# Patient Record
Sex: Female | Born: 1960
Health system: Southern US, Community
[De-identification: ages and names within clinical notes are randomized; demographics above are authoritative.]

## PROBLEM LIST (undated history)

## (undated) DIAGNOSIS — L900 Lichen sclerosus et atrophicus: Secondary | ICD-10-CM

## (undated) DIAGNOSIS — F329 Major depressive disorder, single episode, unspecified: Secondary | ICD-10-CM

## (undated) DIAGNOSIS — Z79890 Hormone replacement therapy (postmenopausal): Secondary | ICD-10-CM

## (undated) DIAGNOSIS — N951 Menopausal and female climacteric states: Secondary | ICD-10-CM

## (undated) DIAGNOSIS — F439 Reaction to severe stress, unspecified: Secondary | ICD-10-CM

## (undated) DIAGNOSIS — F419 Anxiety disorder, unspecified: Secondary | ICD-10-CM

## (undated) DIAGNOSIS — T7840XA Allergy, unspecified, initial encounter: Secondary | ICD-10-CM

## (undated) DIAGNOSIS — H9190 Unspecified hearing loss, unspecified ear: Secondary | ICD-10-CM

## (undated) DIAGNOSIS — N952 Postmenopausal atrophic vaginitis: Secondary | ICD-10-CM

## (undated) DIAGNOSIS — J302 Other seasonal allergic rhinitis: Secondary | ICD-10-CM

## (undated) HISTORY — DX: Reaction to severe stress, unspecified: F43.9

## (undated) HISTORY — DX: Menopausal and female climacteric states: N95.1

## (undated) HISTORY — DX: Lichen sclerosus et atrophicus: L90.0

## (undated) HISTORY — DX: Anxiety disorder, unspecified: F41.9

## (undated) HISTORY — DX: Allergy, unspecified, initial encounter: T78.40XA

## (undated) HISTORY — DX: Postmenopausal atrophic vaginitis: N95.2

## (undated) HISTORY — DX: Other seasonal allergic rhinitis: J30.2

## (undated) HISTORY — DX: Major depressive disorder, single episode, unspecified: F32.9

## (undated) HISTORY — DX: Hormone replacement therapy: Z79.890

## (undated) HISTORY — DX: Unspecified hearing loss, unspecified ear: H91.90

---

## 2001-02-25 ENCOUNTER — Other Ambulatory Visit: Admission: RE | Admit: 2001-02-25 | Discharge: 2001-02-25 | Payer: Self-pay | Admitting: Obstetrics and Gynecology

## 2001-07-05 ENCOUNTER — Emergency Department (HOSPITAL_COMMUNITY): Admission: EM | Admit: 2001-07-05 | Discharge: 2001-07-05 | Payer: Self-pay | Admitting: *Deleted

## 2002-07-24 ENCOUNTER — Ambulatory Visit (HOSPITAL_COMMUNITY): Admission: RE | Admit: 2002-07-24 | Discharge: 2002-07-24 | Payer: Self-pay | Admitting: Pulmonary Disease

## 2003-02-23 ENCOUNTER — Ambulatory Visit (HOSPITAL_COMMUNITY): Admission: RE | Admit: 2003-02-23 | Discharge: 2003-02-23 | Payer: Self-pay | Admitting: Obstetrics and Gynecology

## 2003-02-23 ENCOUNTER — Encounter: Payer: Self-pay | Admitting: Obstetrics and Gynecology

## 2004-03-26 ENCOUNTER — Ambulatory Visit (HOSPITAL_COMMUNITY): Admission: RE | Admit: 2004-03-26 | Discharge: 2004-03-26 | Payer: Self-pay | Admitting: Obstetrics and Gynecology

## 2004-07-22 ENCOUNTER — Ambulatory Visit (HOSPITAL_COMMUNITY): Admission: RE | Admit: 2004-07-22 | Discharge: 2004-07-22 | Payer: Self-pay | Admitting: General Surgery

## 2004-07-22 IMAGING — CR DG ANKLE COMPLETE 3+V*L*
2 series · 2 of 2 positions shown · non-contrast
Comparison: None.

CLINICAL DATA: Injured left ankle.

LEFT ANKLE - 3 VIEW  [DATE]:

[view not recorded (1 of 2)]
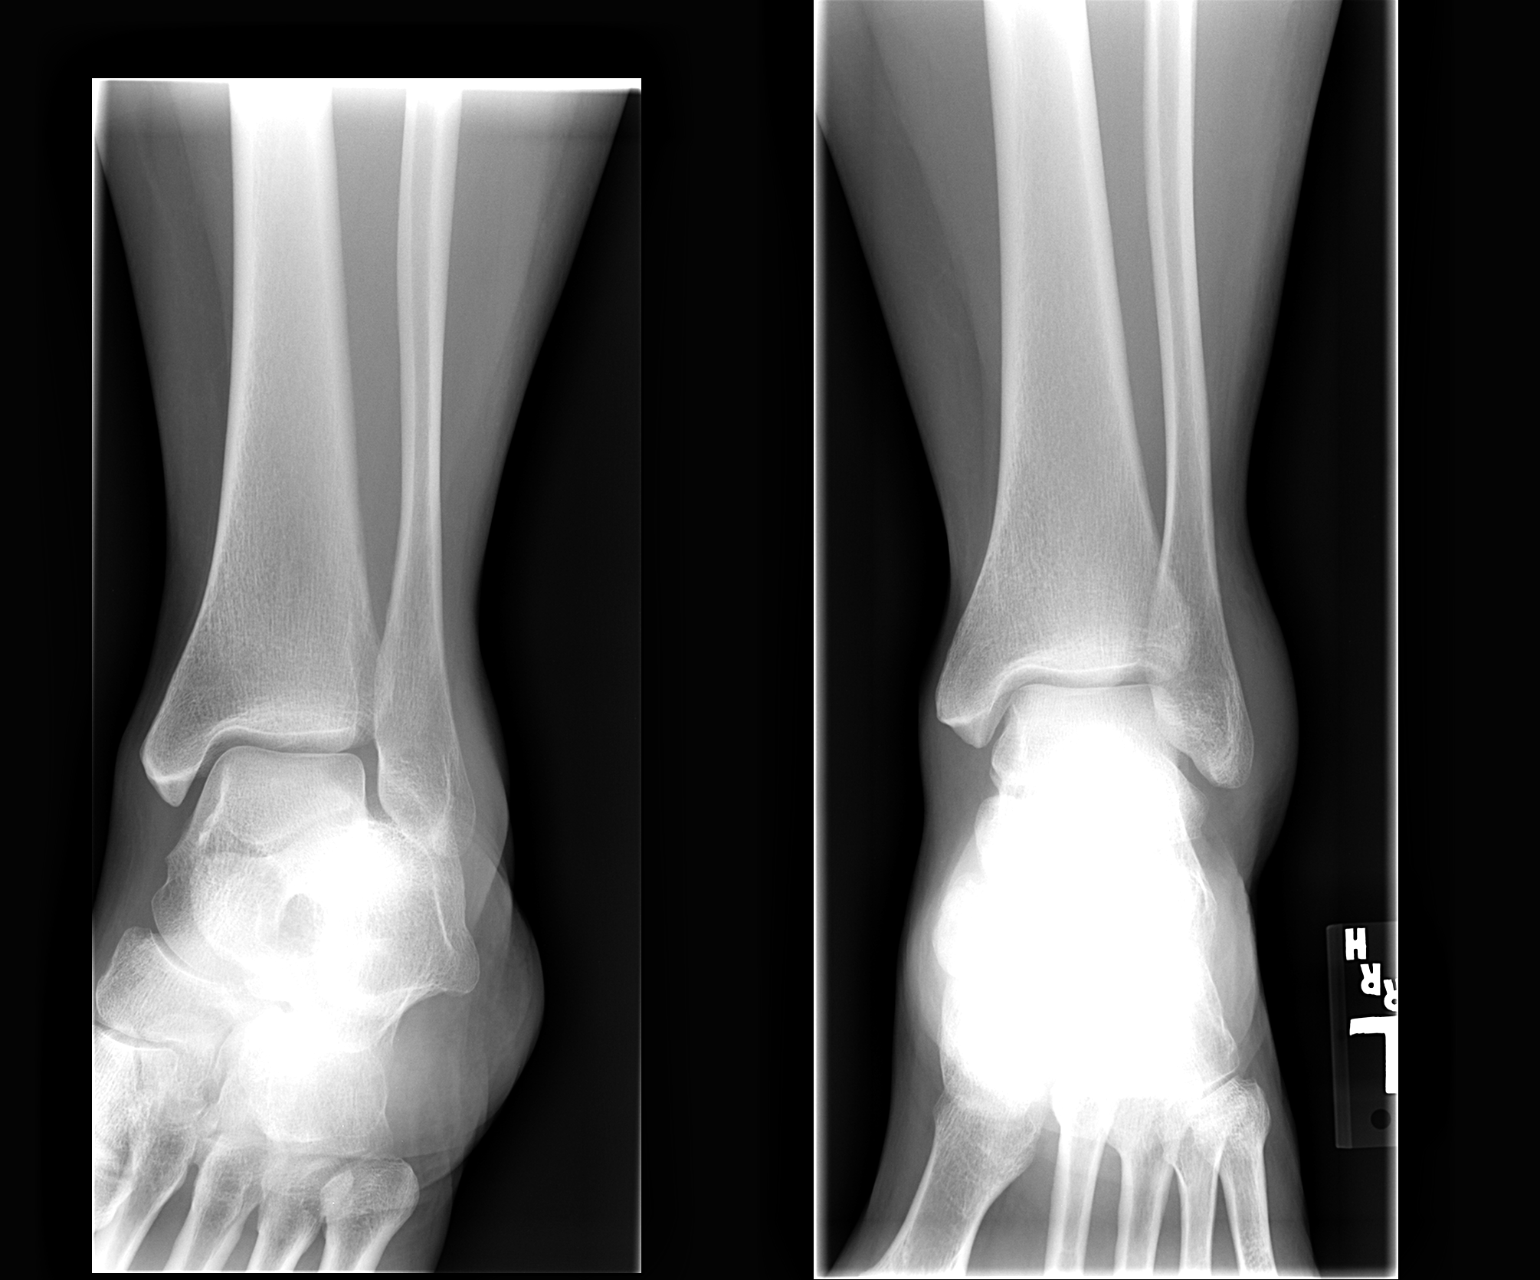

[view not recorded (2 of 2)]
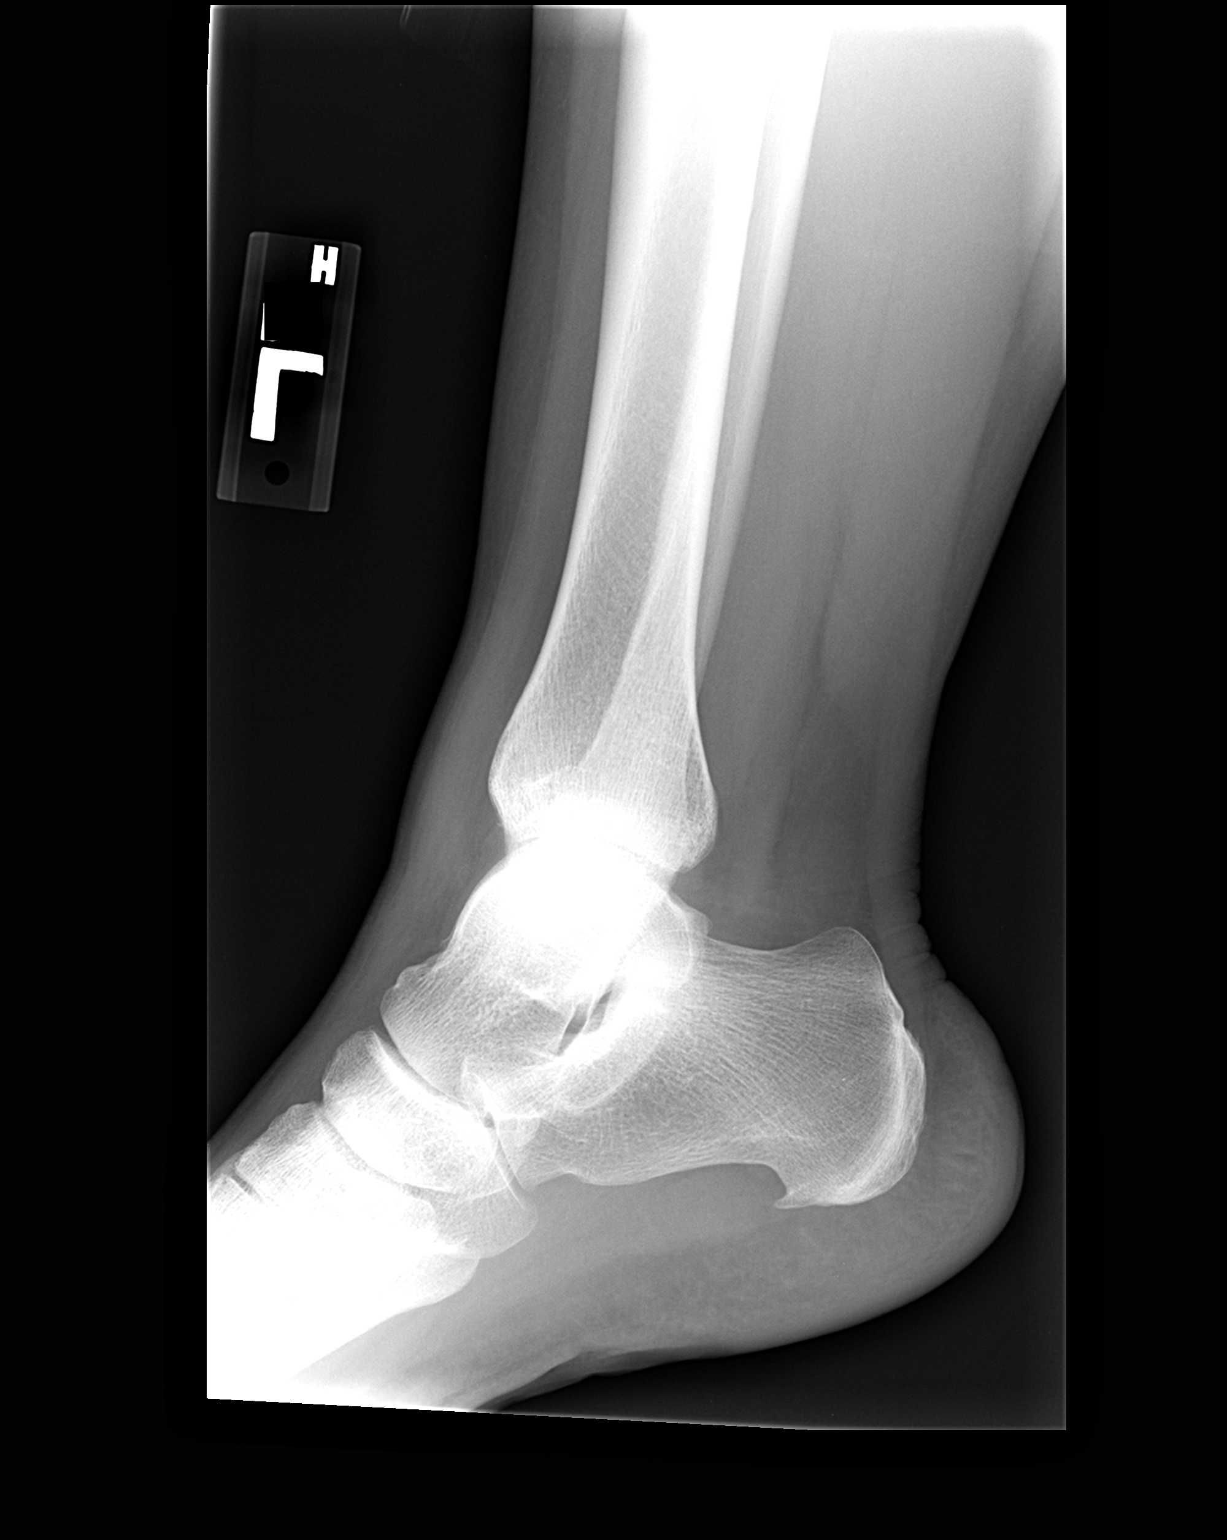

[2 of 2 positions shown; findings below may reference images not displayed]

FINDINGS: Marked lateral soft tissue swelling is present and there is an ankle
joint effusion. There is also anterior soft tissue swelling. There is no
evidence of an acute fracture or dislocation. The ankle mortise is intact. Note
is made of a small plantar spur arising from the calcaneus.
IMPRESSION: No skeletal abnormalities.

## 2004-11-07 ENCOUNTER — Ambulatory Visit (HOSPITAL_COMMUNITY): Admission: RE | Admit: 2004-11-07 | Discharge: 2004-11-07 | Payer: Self-pay | Admitting: Pulmonary Disease

## 2004-11-07 IMAGING — CR DG SHOULDER 2+V*R*
3 series · 3 of 3 positions shown · non-contrast
Comparison: none

CLINICAL DATA: Numbness and tingling in the right arm. 
 RIGHT SHOULDER - 3 VIEW: 
 No prior studies.

[view not recorded (1 of 3)]
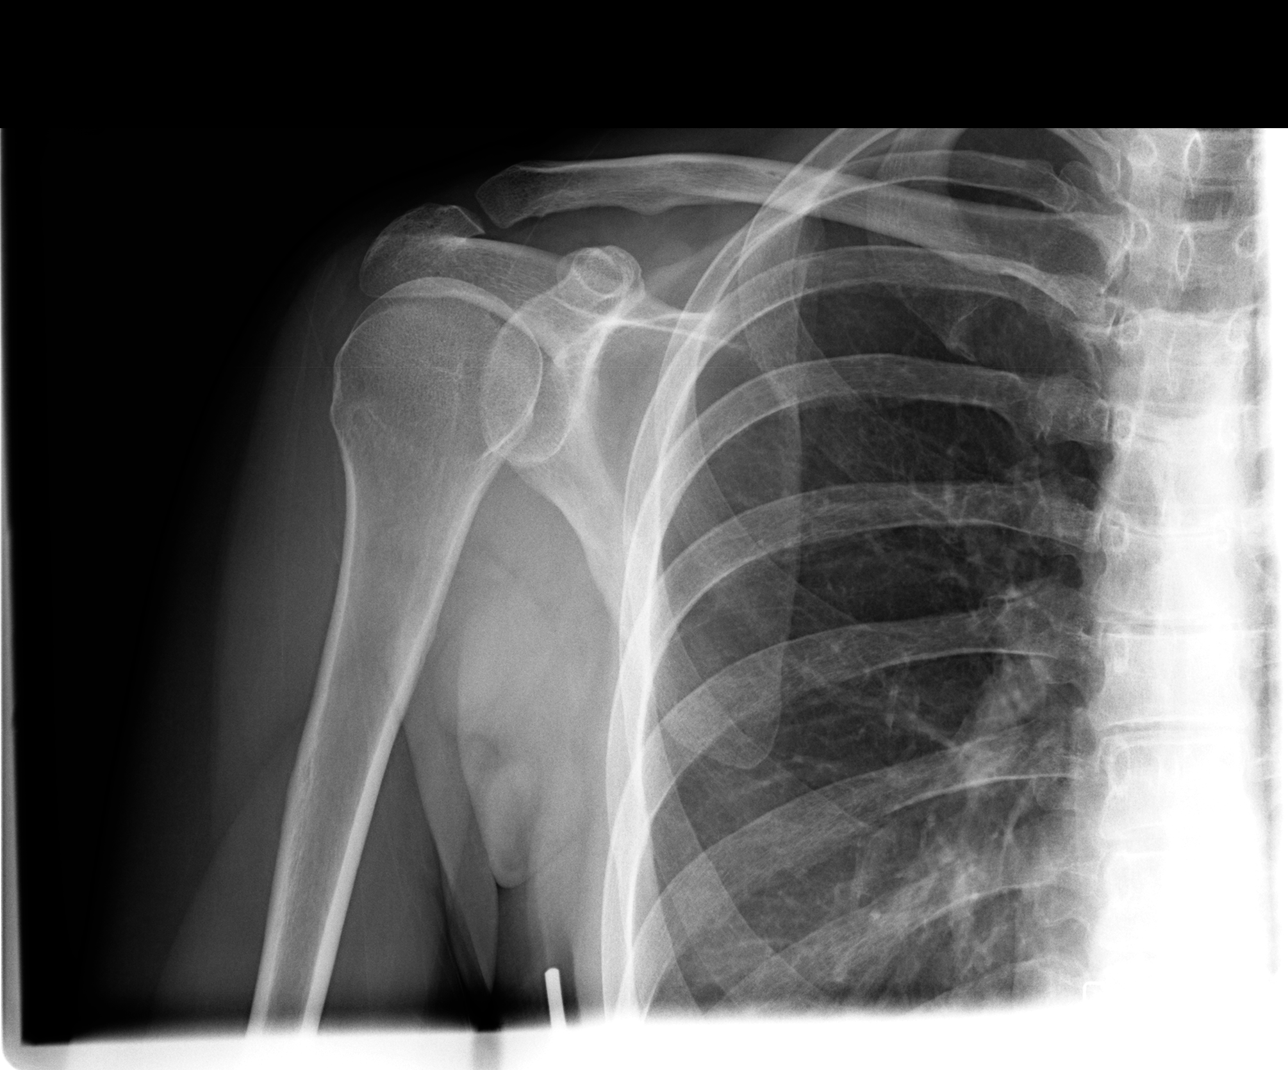

[view not recorded (2 of 3)]
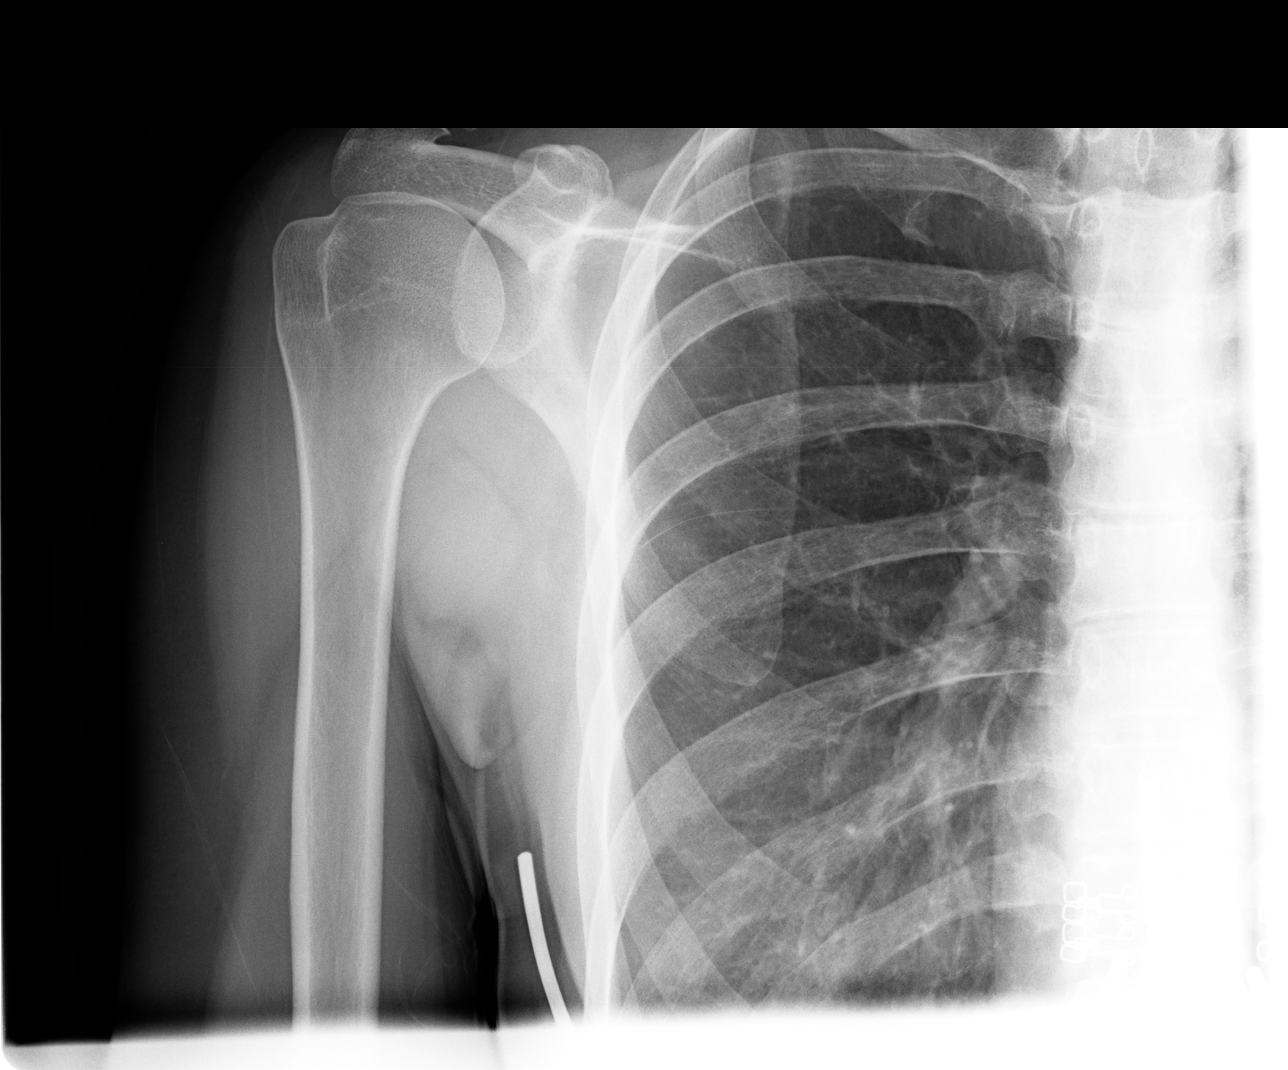

[view not recorded (3 of 3)]
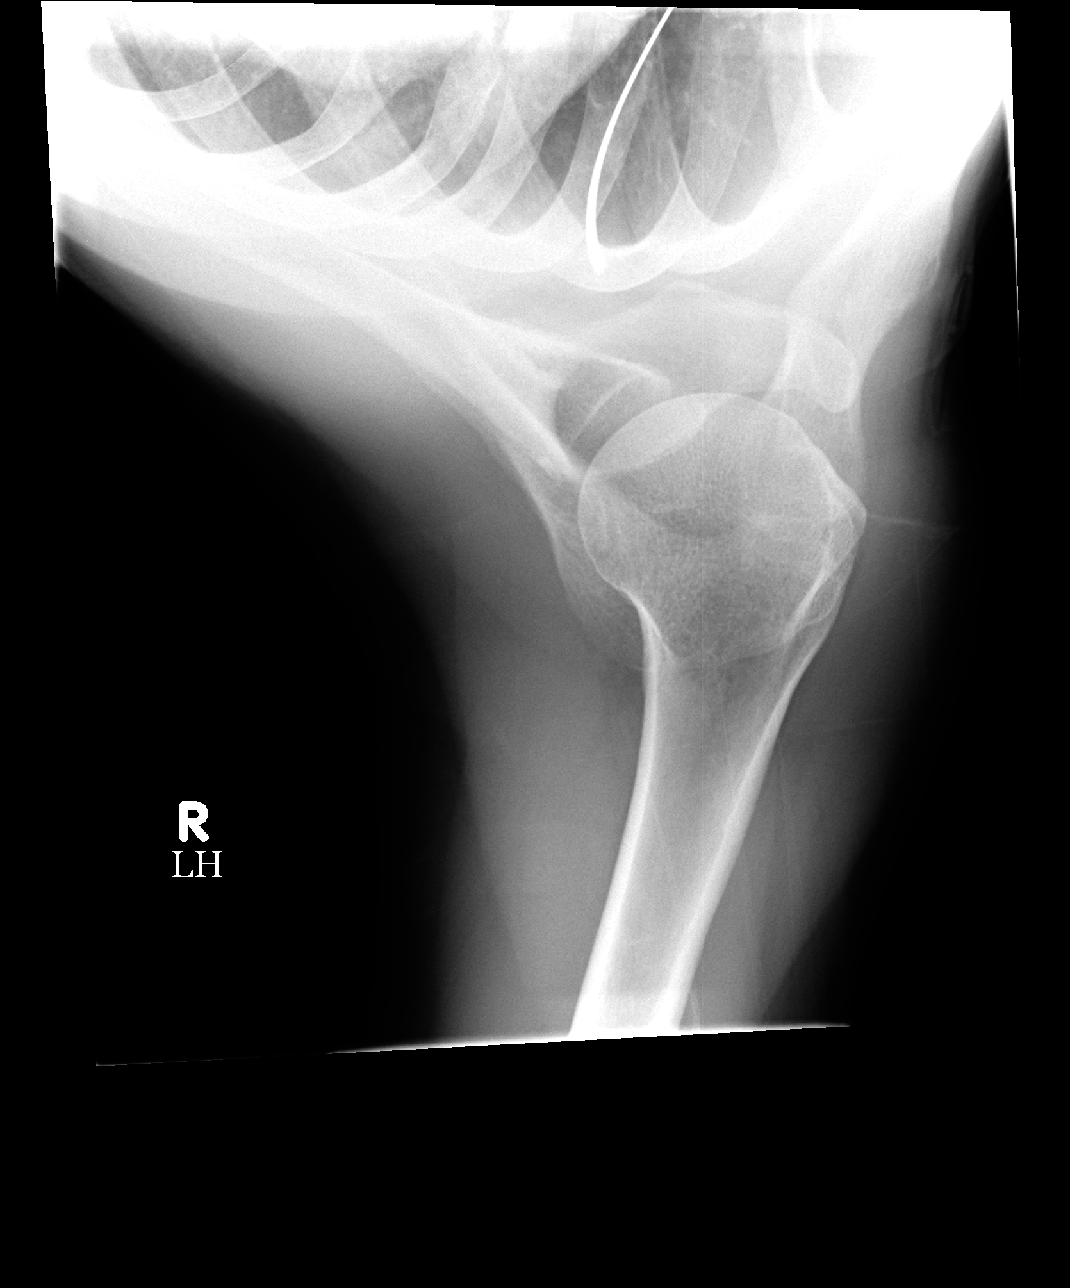

[3 of 3 positions shown; findings below may reference images not displayed]

FINDINGS: There is no evidence of fracture or dislocation.  There is no evidence of arthropathy or other focal bone abnormality.  Soft tissues are unremarkable.
IMPRESSION: Negative.
 CERVICAL SPINE ? 5 VIEW:
FINDINGS: There is some minimal posterior spurring at C3-4.  No signs of osseous foraminal narrowing.  No malalignment.  No acute findings.
IMPRESSION: No acute findings.   Very minimal posterior spurring at C3-4.

## 2004-11-07 IMAGING — CR DG CERVICAL SPINE COMPLETE 4+V
5 series · 5 of 5 positions shown · non-contrast
Comparison: none

CLINICAL DATA: Numbness and tingling in the right arm. 
 RIGHT SHOULDER - 3 VIEW: 
 No prior studies.

[view not recorded (1 of 5)]
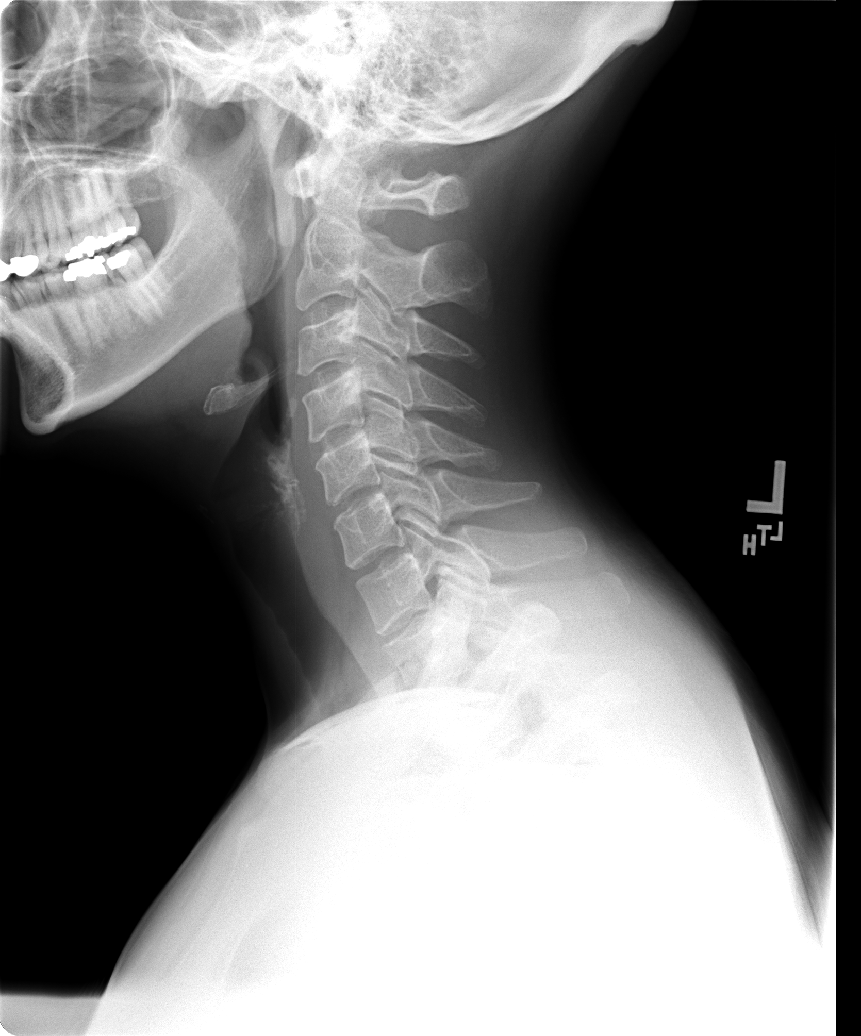

[view not recorded (2 of 5)]
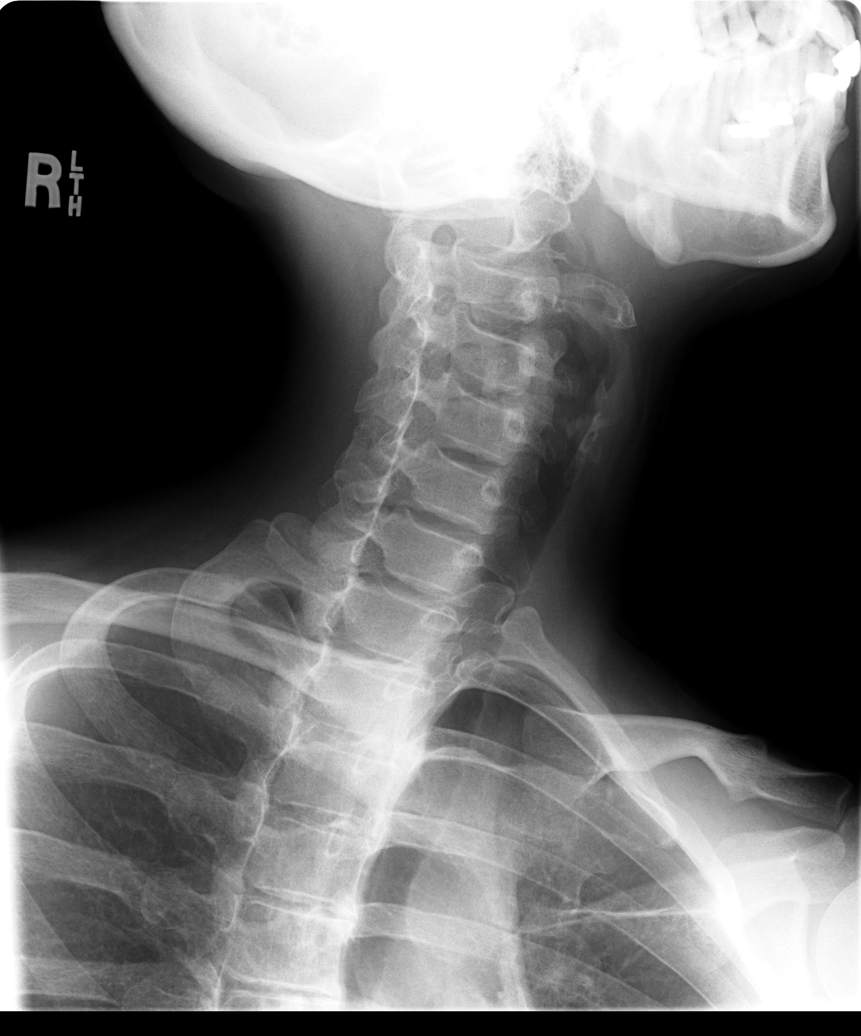

[view not recorded (3 of 5)]
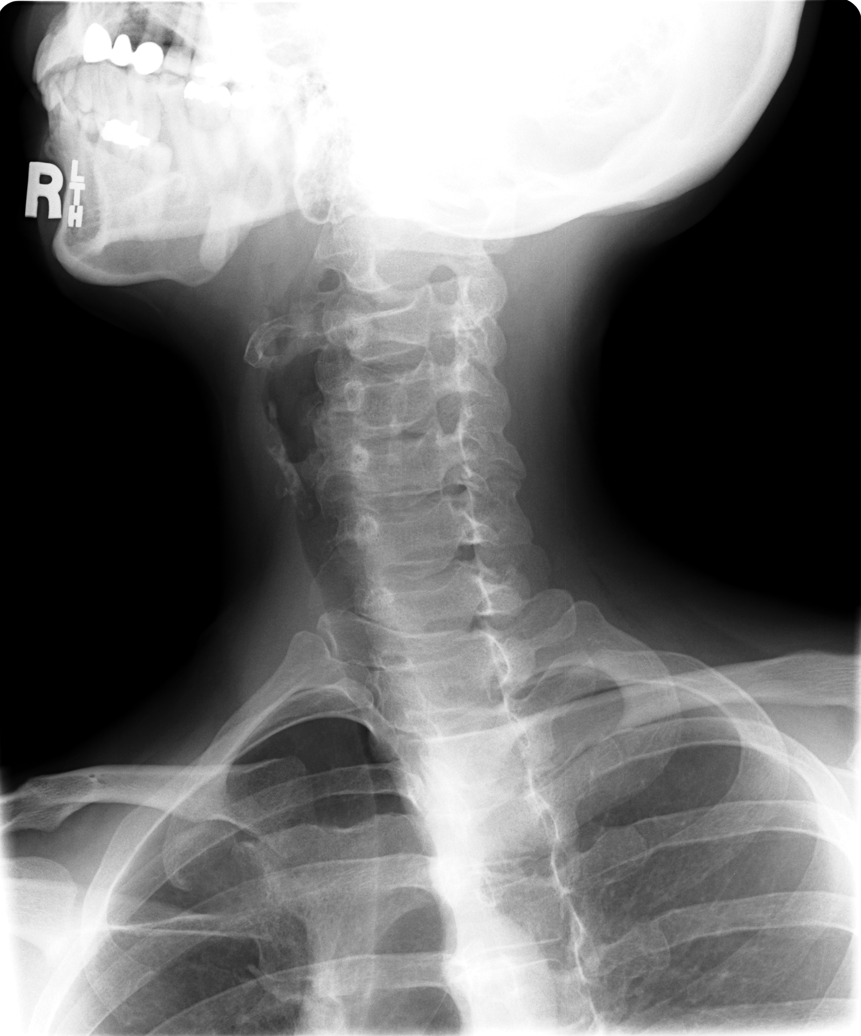

[view not recorded (4 of 5)]
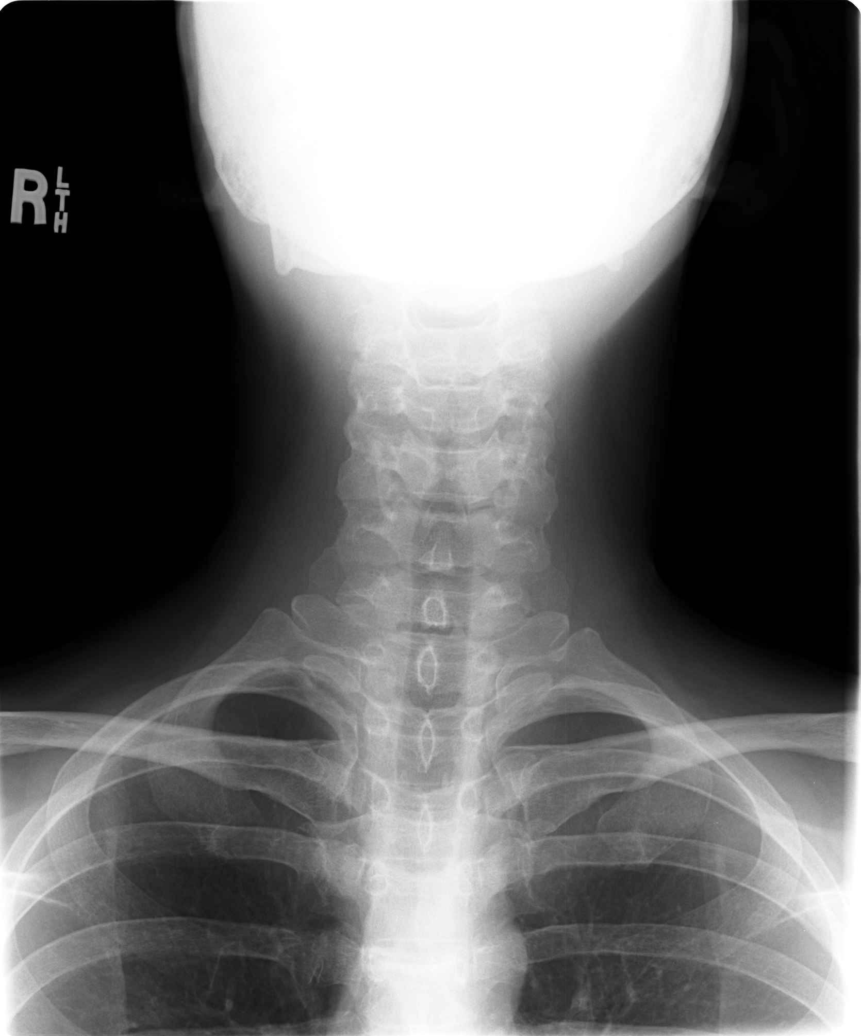

[view not recorded (5 of 5)]
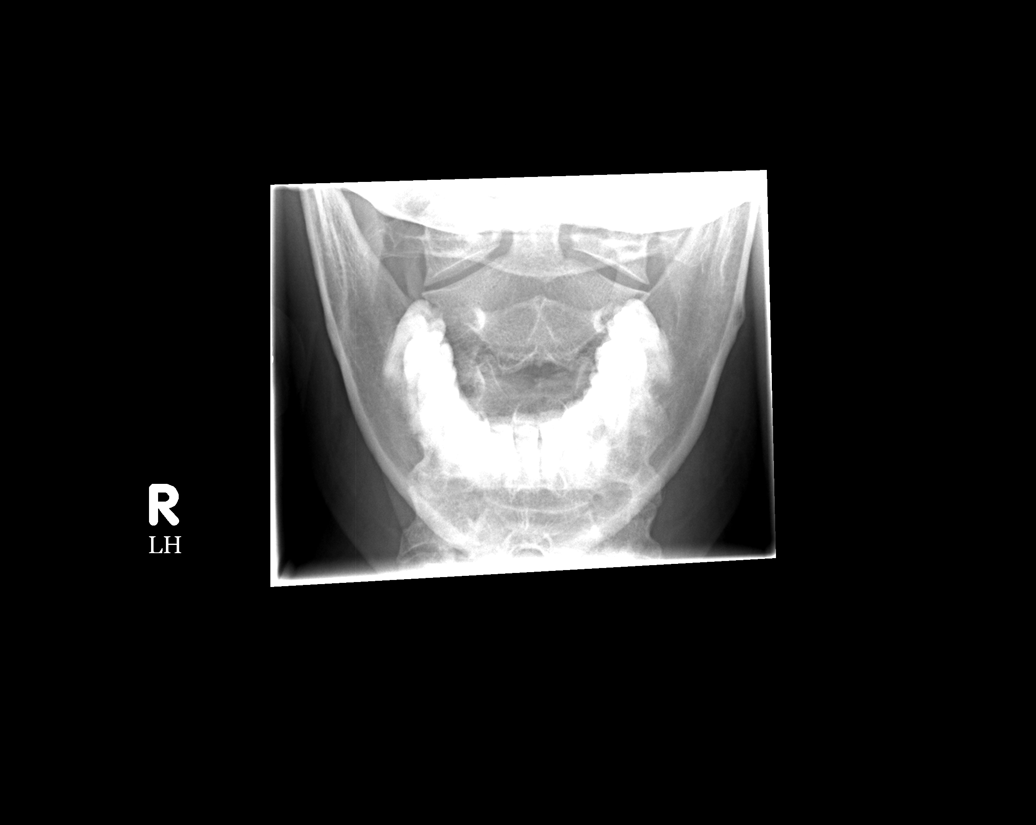

[5 of 5 positions shown; findings below may reference images not displayed]

FINDINGS: There is no evidence of fracture or dislocation.  There is no evidence of arthropathy or other focal bone abnormality.  Soft tissues are unremarkable.
IMPRESSION: Negative.
 CERVICAL SPINE ? 5 VIEW:
FINDINGS: There is some minimal posterior spurring at C3-4.  No signs of osseous foraminal narrowing.  No malalignment.  No acute findings.
IMPRESSION: No acute findings.   Very minimal posterior spurring at C3-4.

## 2005-04-03 ENCOUNTER — Ambulatory Visit (HOSPITAL_COMMUNITY): Admission: RE | Admit: 2005-04-03 | Discharge: 2005-04-03 | Payer: Self-pay | Admitting: Obstetrics and Gynecology

## 2005-04-03 IMAGING — CR DG LUMBAR SPINE COMPLETE 4+V
5 series · 5 of 5 positions shown · non-contrast
Comparison: none

CLINICAL DATA: Low back pain for years.
LUMBAR SPINE - 5 VIEW:

[view not recorded (1 of 5)]
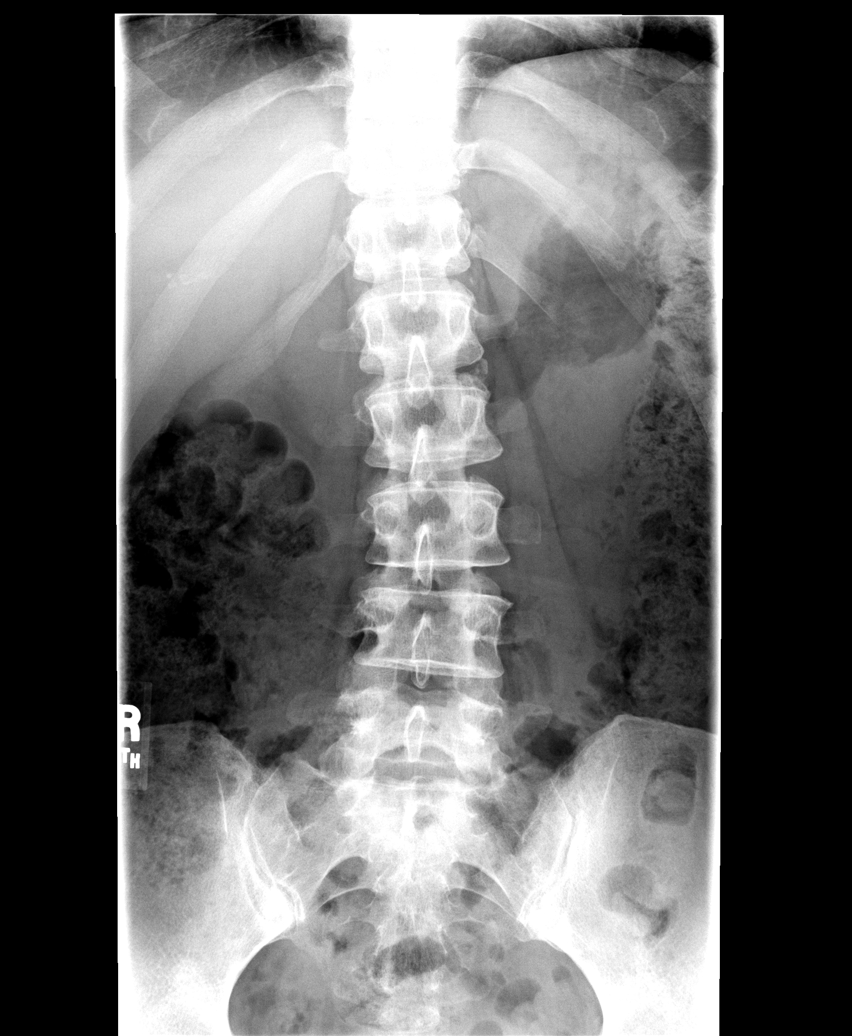

[view not recorded (2 of 5)]
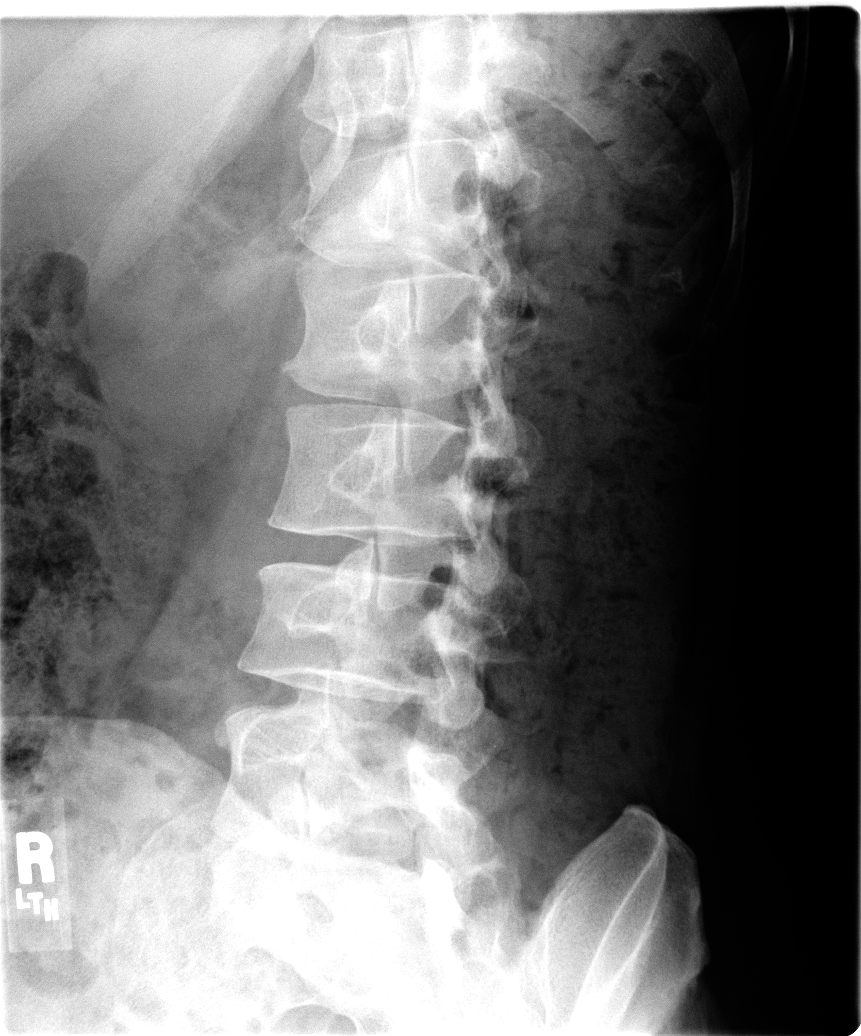

[view not recorded (3 of 5)]
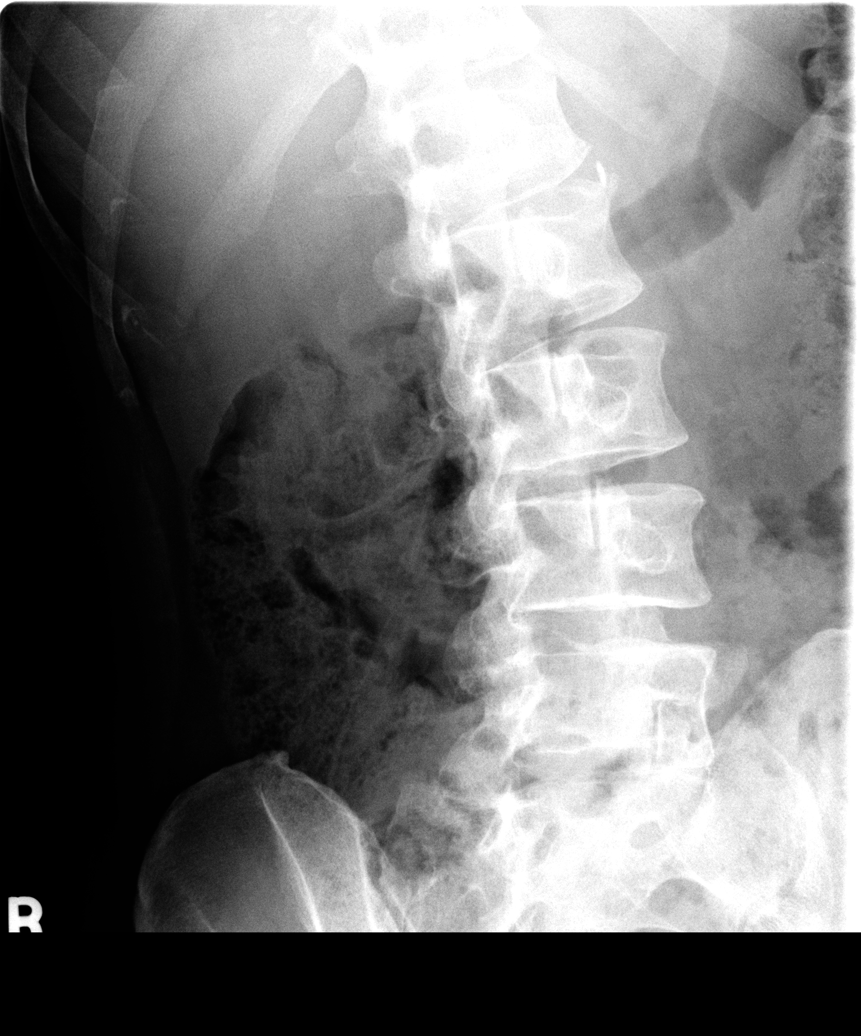

[view not recorded (4 of 5)]
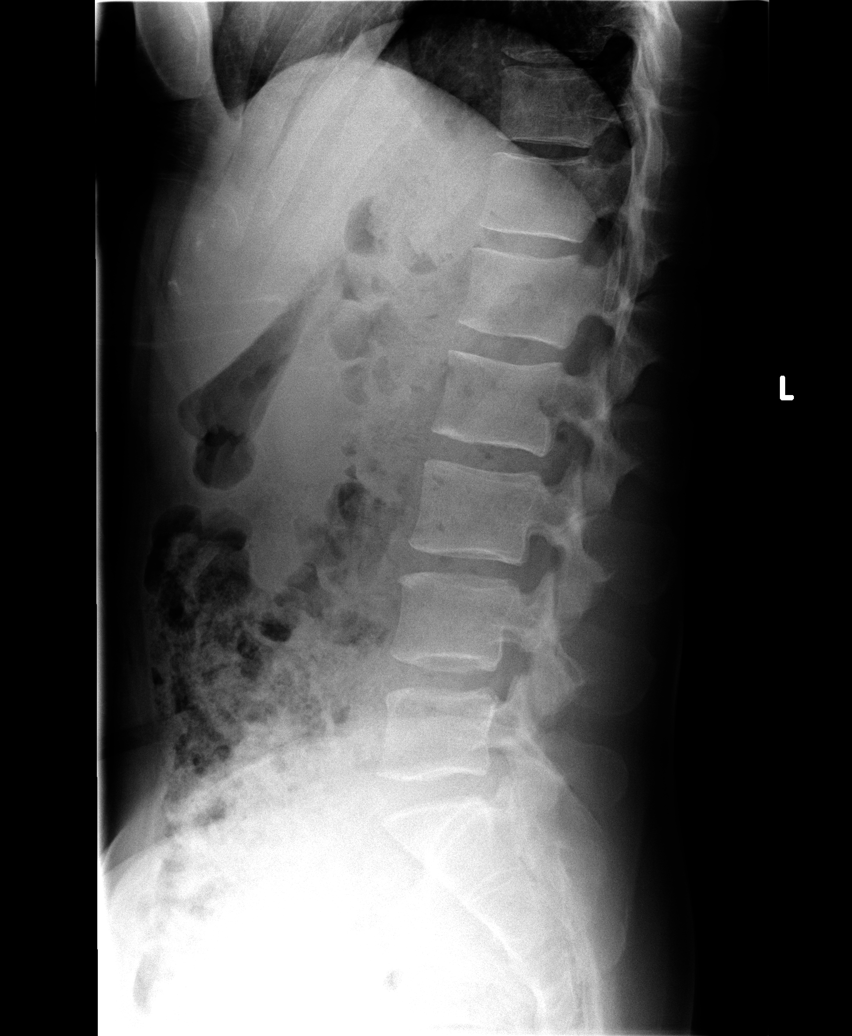

[view not recorded (5 of 5)]
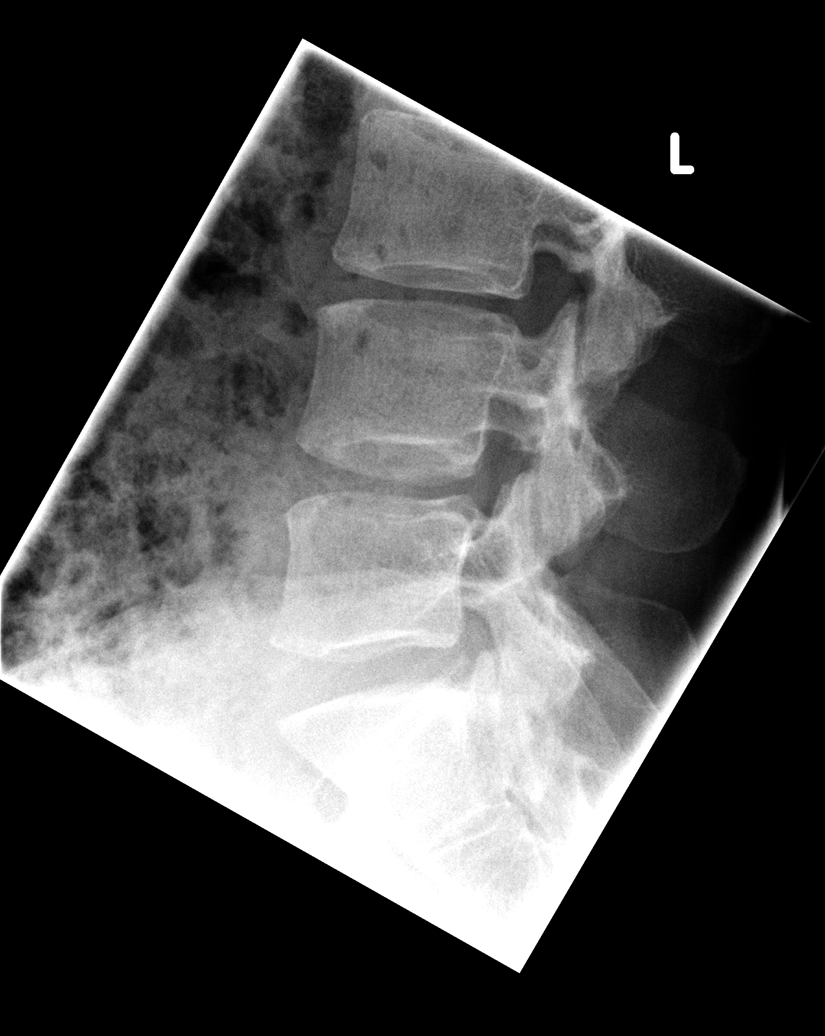

[5 of 5 positions shown; findings below may reference images not displayed]

FINDINGS: Minimal lumbar scoliosis convex to the left.  Mild disk space narrowing and osteophytic formation on the right at L3-4.  No pars defects or spondylolisthesis.  SI joints appear unremarkable.
IMPRESSION: Minimal lumbar scoliosis.  Degenerative disk disease changes of L3-4. 
RIGHT HIP - 3 VIEW:
There is no evidence of hip fracture or dislocation.  There is no evidence of arthropathy or other focal bone abnormality.
IMPRESSION: Negative.
.

## 2005-04-03 IMAGING — CR DG HIP COMPLETE 2+V*R*
3 series · 3 of 3 positions shown · non-contrast
Comparison: none

CLINICAL DATA: Low back pain for years.
LUMBAR SPINE - 5 VIEW:

[view not recorded (1 of 3)]
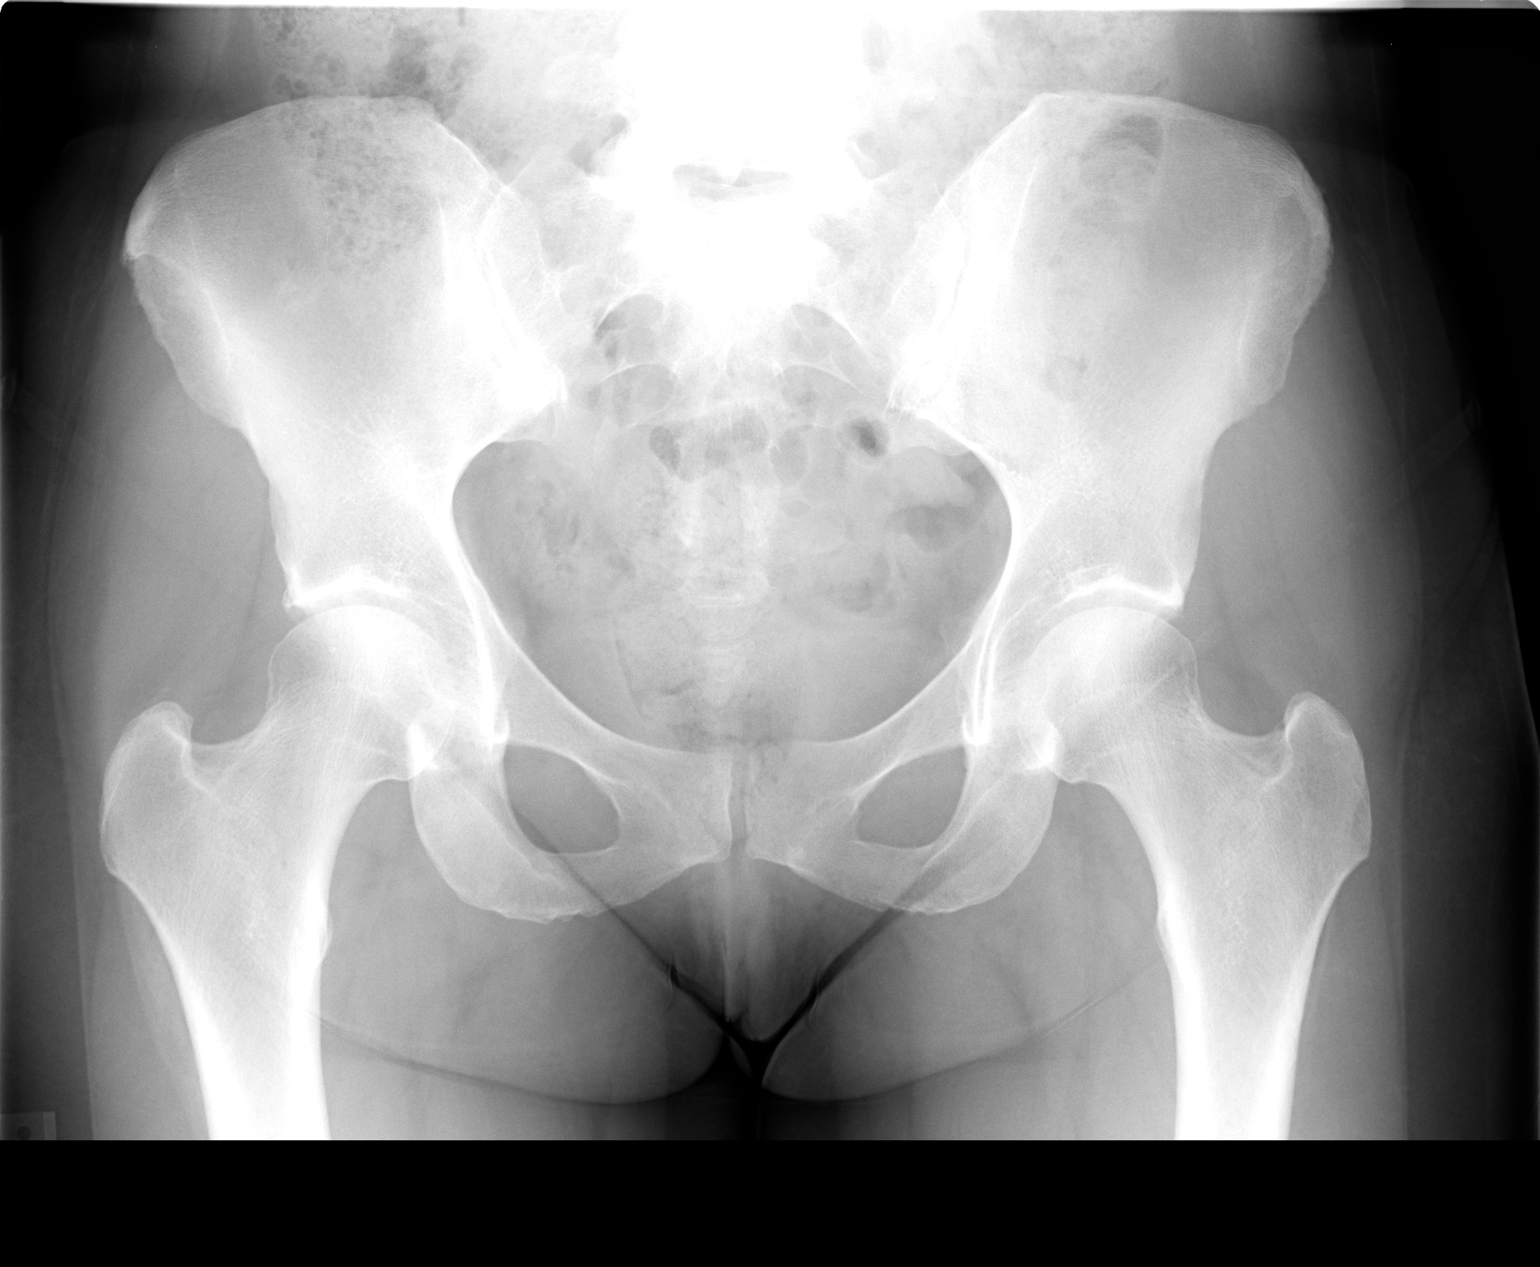

[view not recorded (2 of 3)]
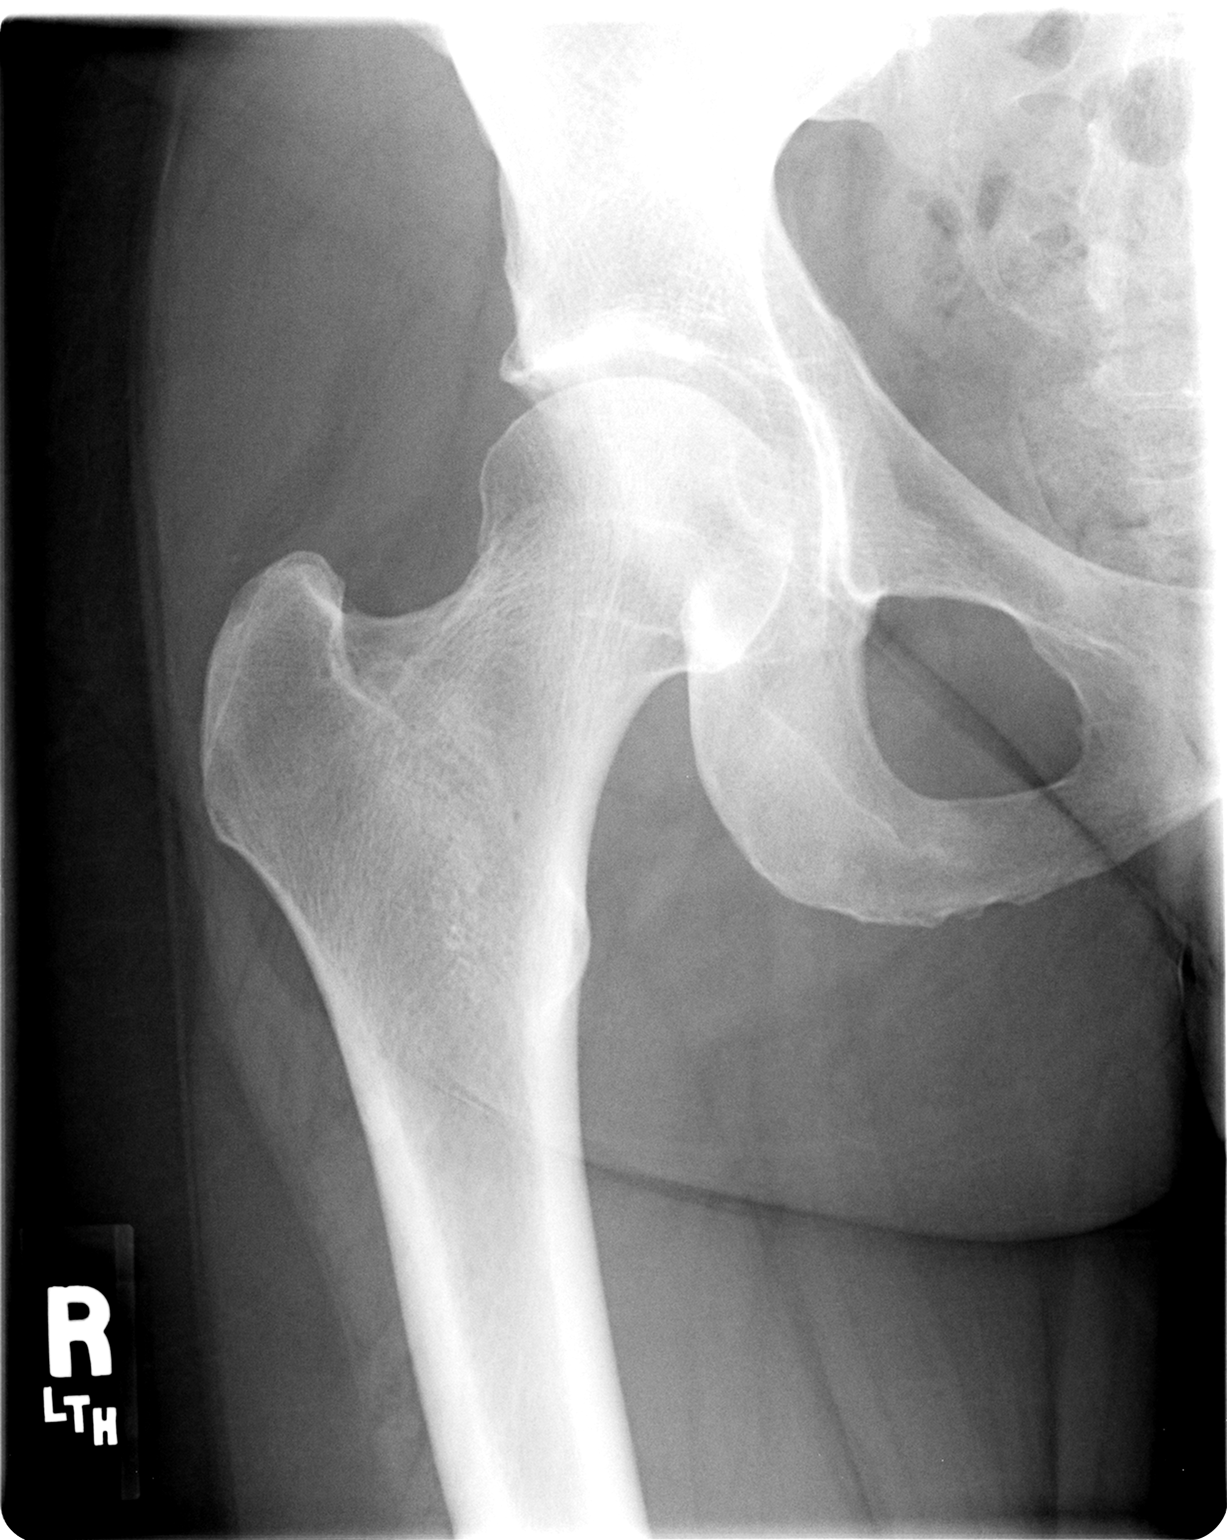

[view not recorded (3 of 3)]
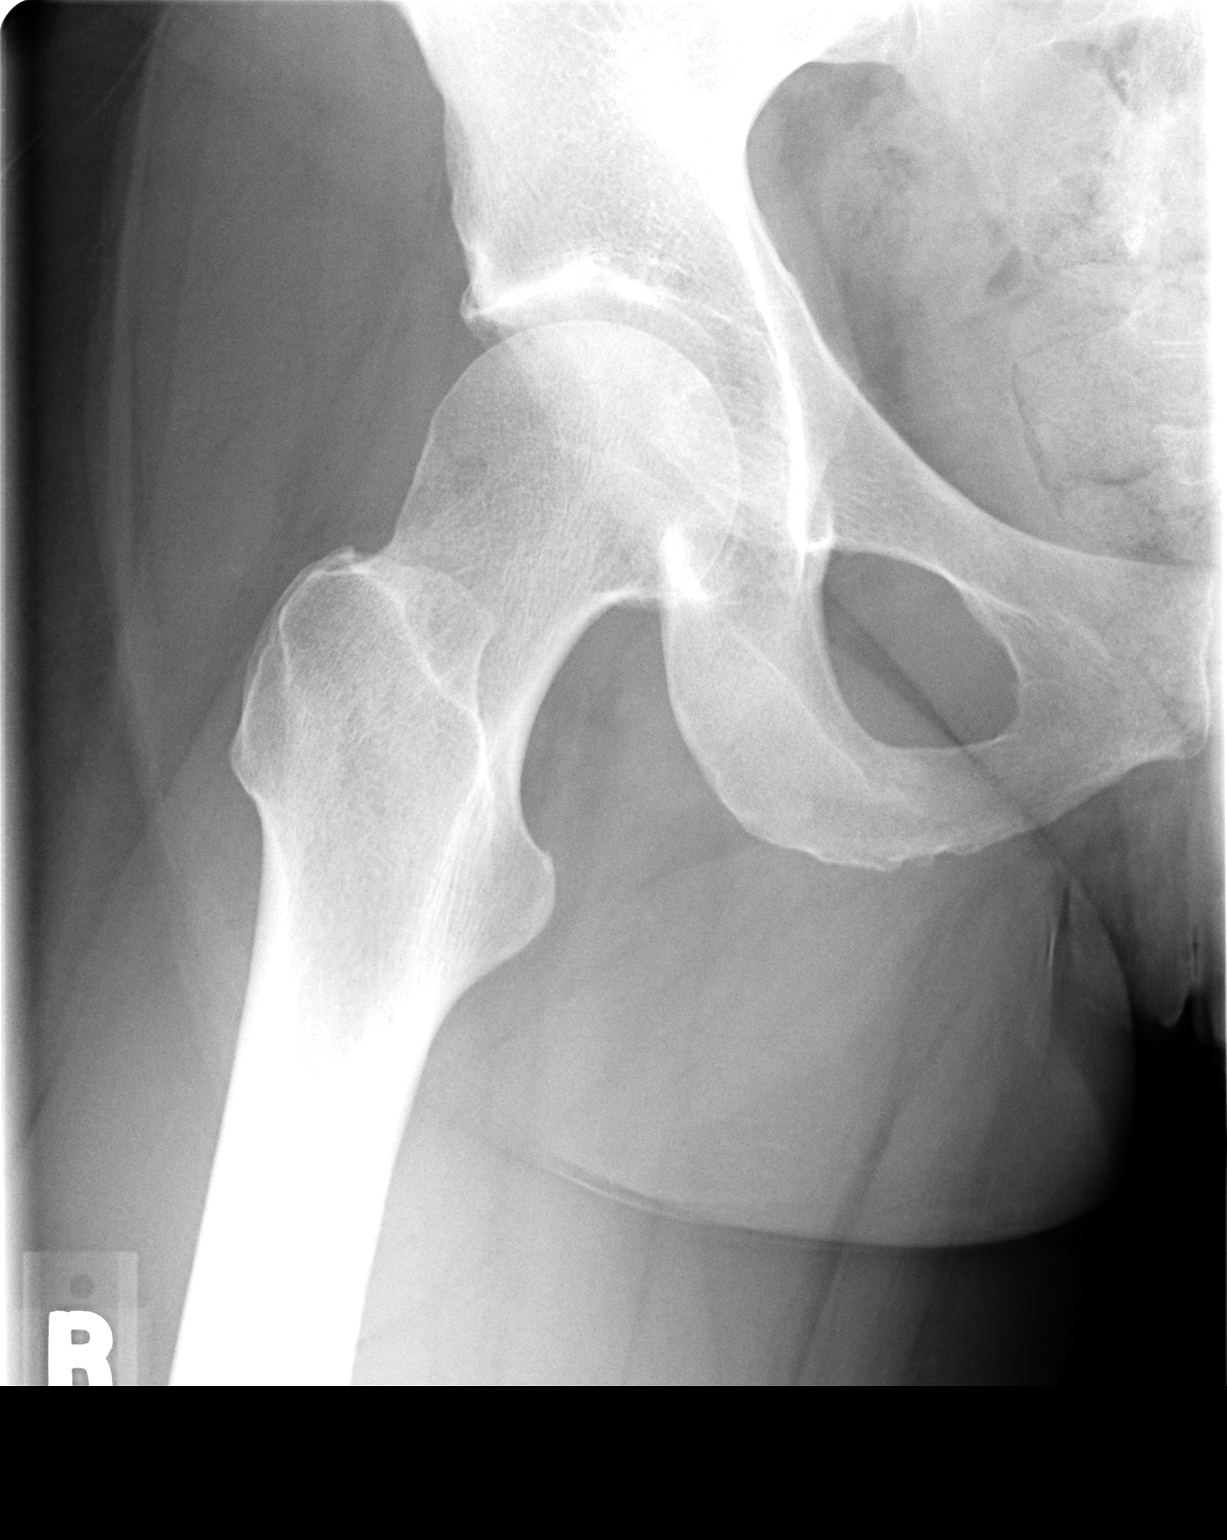

[3 of 3 positions shown; findings below may reference images not displayed]

FINDINGS: Minimal lumbar scoliosis convex to the left.  Mild disk space narrowing and osteophytic formation on the right at L3-4.  No pars defects or spondylolisthesis.  SI joints appear unremarkable.
IMPRESSION: Minimal lumbar scoliosis.  Degenerative disk disease changes of L3-4. 
RIGHT HIP - 3 VIEW:
There is no evidence of hip fracture or dislocation.  There is no evidence of arthropathy or other focal bone abnormality.
IMPRESSION: Negative.
.

## 2006-04-09 ENCOUNTER — Ambulatory Visit (HOSPITAL_COMMUNITY): Admission: RE | Admit: 2006-04-09 | Discharge: 2006-04-09 | Payer: Self-pay | Admitting: Obstetrics and Gynecology

## 2007-04-15 ENCOUNTER — Ambulatory Visit (HOSPITAL_COMMUNITY): Admission: RE | Admit: 2007-04-15 | Discharge: 2007-04-15 | Payer: Self-pay | Admitting: Obstetrics and Gynecology

## 2007-04-22 ENCOUNTER — Other Ambulatory Visit: Admission: RE | Admit: 2007-04-22 | Discharge: 2007-04-22 | Payer: Self-pay | Admitting: Obstetrics and Gynecology

## 2007-06-20 ENCOUNTER — Ambulatory Visit: Payer: Self-pay | Admitting: Orthopedic Surgery

## 2007-06-20 DIAGNOSIS — M5430 Sciatica, unspecified side: Secondary | ICD-10-CM | POA: Insufficient documentation

## 2008-04-20 ENCOUNTER — Ambulatory Visit (HOSPITAL_COMMUNITY): Admission: RE | Admit: 2008-04-20 | Discharge: 2008-04-20 | Payer: Self-pay | Admitting: Obstetrics and Gynecology

## 2008-06-15 ENCOUNTER — Other Ambulatory Visit: Admission: RE | Admit: 2008-06-15 | Discharge: 2008-06-15 | Payer: Self-pay | Admitting: Obstetrics and Gynecology

## 2009-04-26 ENCOUNTER — Ambulatory Visit (HOSPITAL_COMMUNITY): Admission: RE | Admit: 2009-04-26 | Discharge: 2009-04-26 | Payer: Self-pay | Admitting: Obstetrics and Gynecology

## 2009-04-26 IMAGING — MG MM DIGITAL SCREENING
4 series · 4 of 4 positions shown · non-contrast
Comparison: none

DG SCREEN MAMMOGRAM BILATERAL
Bilateral CC and MLO view(s) were taken.

DIGITAL SCREENING MAMMOGRAM WITH CAD:
The breast tissue is extremely dense.  No masses or malignant type calcifications are identified.  
Compared with prior studies.
Images were processed with CAD.

[L CC]
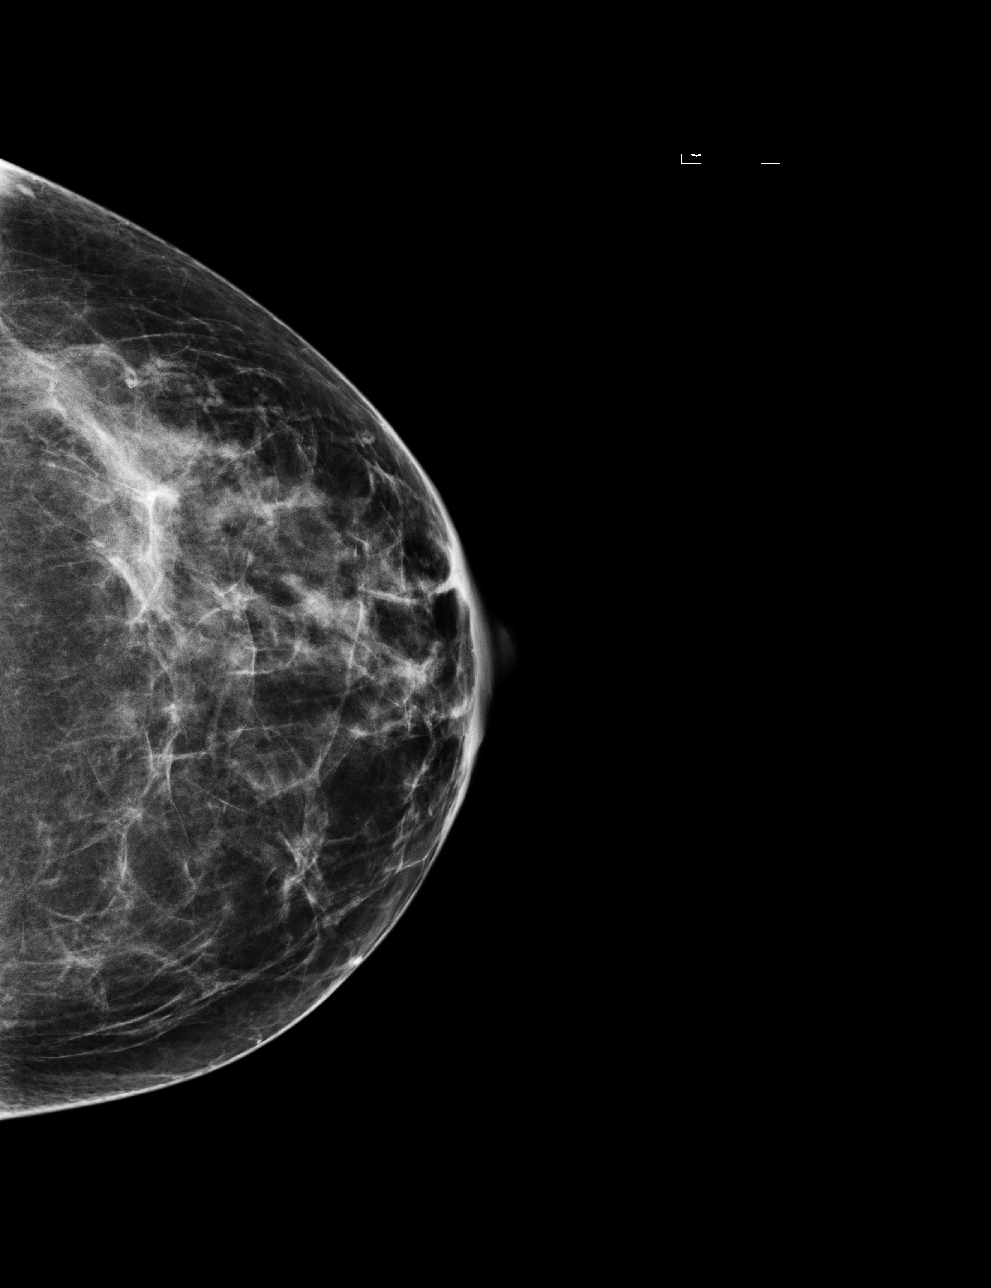

[L MLO]
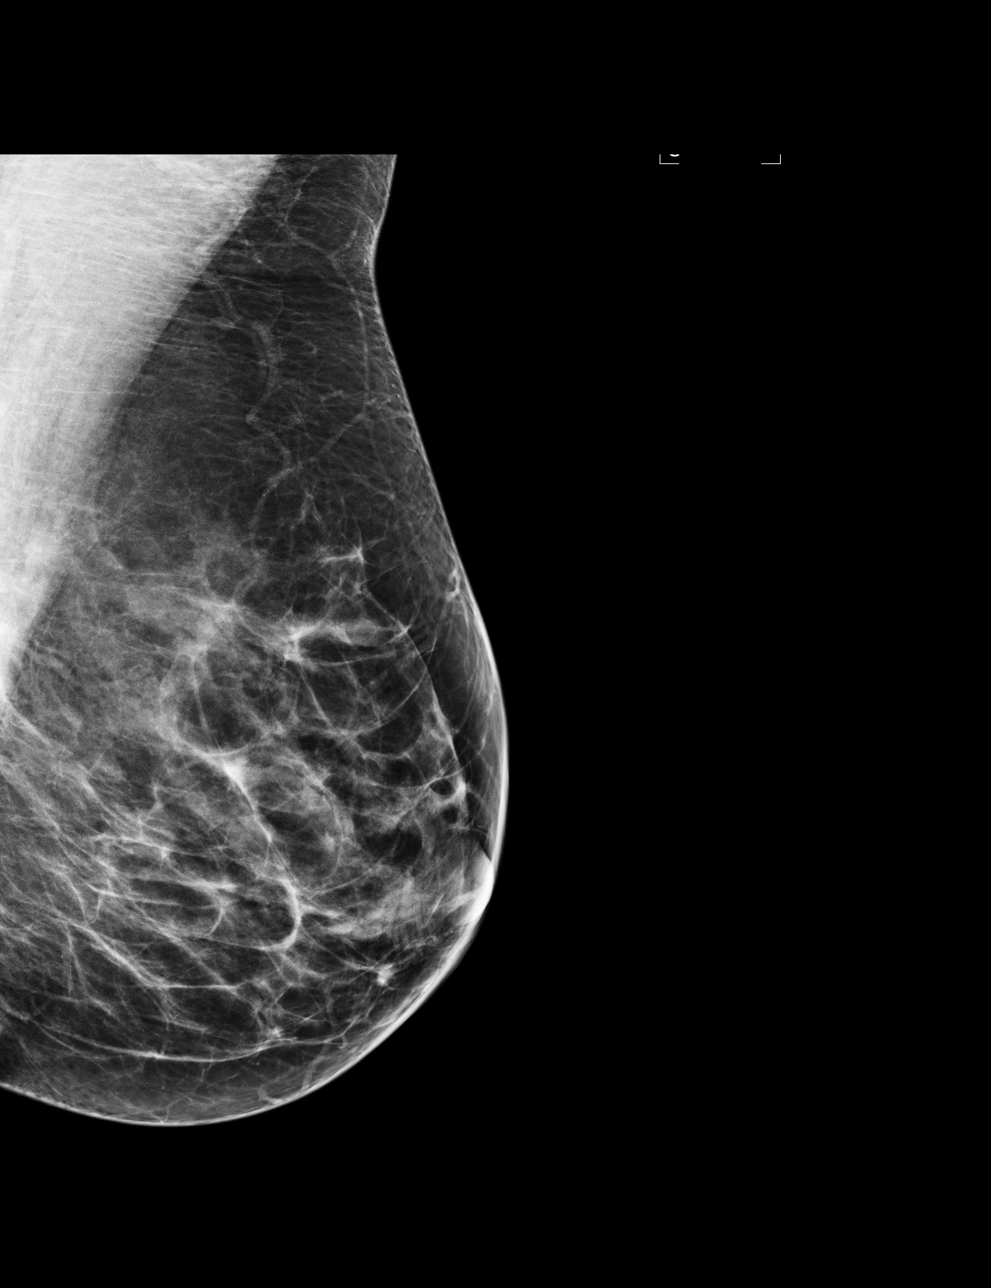

[R CC]
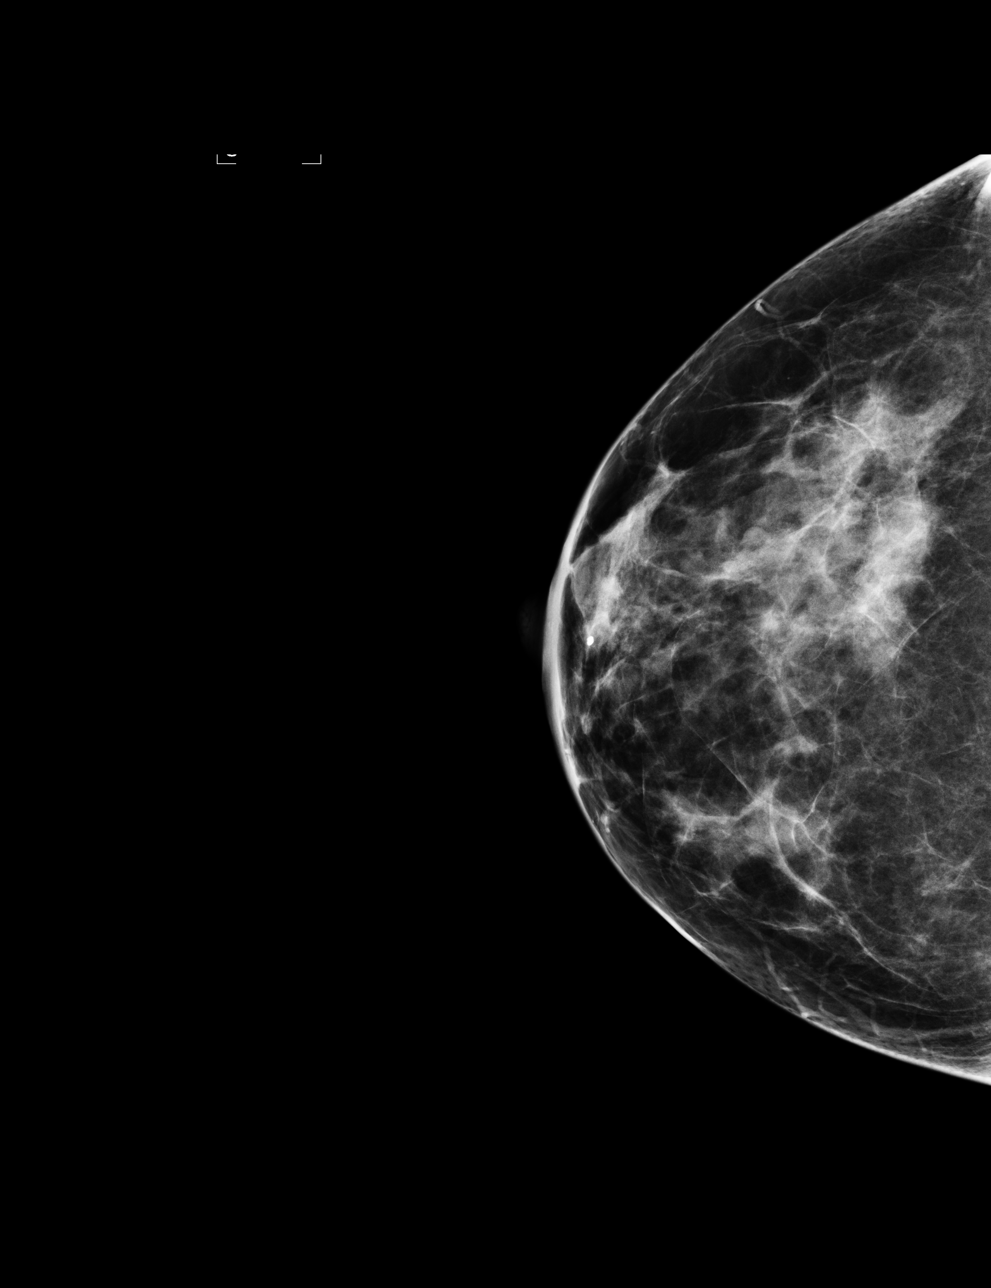

[R MLO]
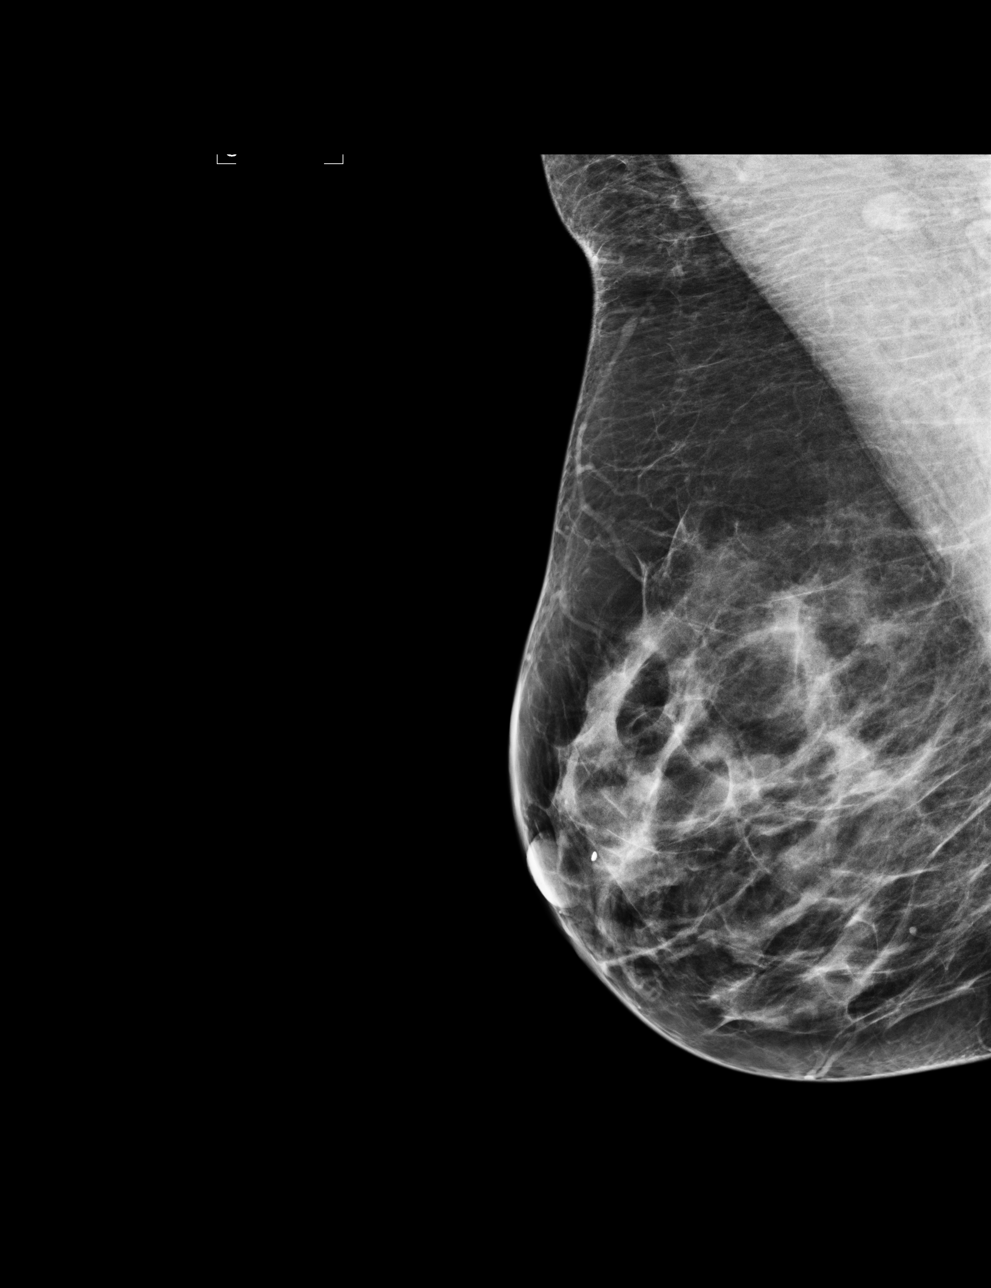

[4 of 4 positions shown; findings below may reference images not displayed]

IMPRESSION: No specific mammographic evidence of malignancy.  Next screening mammogram is recommended in one 
year.

A result letter of this screening mammogram will be mailed directly to the patient.

ASSESSMENT: Negative - BI-RADS 1

Screening mammogram in 1 year.
,

## 2009-06-12 DIAGNOSIS — F32A Depression, unspecified: Secondary | ICD-10-CM

## 2009-06-12 HISTORY — DX: Depression, unspecified: F32.A

## 2009-06-21 ENCOUNTER — Other Ambulatory Visit: Admission: RE | Admit: 2009-06-21 | Discharge: 2009-06-21 | Payer: Self-pay | Admitting: Obstetrics and Gynecology

## 2009-06-21 ENCOUNTER — Encounter (INDEPENDENT_AMBULATORY_CARE_PROVIDER_SITE_OTHER): Payer: Self-pay | Admitting: *Deleted

## 2009-06-21 ENCOUNTER — Encounter: Payer: Self-pay | Admitting: Family Medicine

## 2009-06-21 LAB — CONVERTED CEMR LAB
ALT: 13 units/L
AST: 16 units/L
Albumin: 4.5 g/dL
Alkaline Phosphatase: 41 units/L
BUN: 14 mg/dL
Calcium: 9.2 mg/dL
Chloride: 107 meq/L
Hemoglobin: 11.8 g/dL
Total Protein: 6.5 g/dL

## 2009-07-30 ENCOUNTER — Encounter (INDEPENDENT_AMBULATORY_CARE_PROVIDER_SITE_OTHER): Payer: Self-pay | Admitting: *Deleted

## 2009-07-30 ENCOUNTER — Encounter: Payer: Self-pay | Admitting: Family Medicine

## 2009-07-30 LAB — CONVERTED CEMR LAB
HCT: 35 %
Hemoglobin: 11.4 g/dL

## 2009-12-02 ENCOUNTER — Ambulatory Visit: Payer: Self-pay | Admitting: Family Medicine

## 2009-12-02 DIAGNOSIS — R9431 Abnormal electrocardiogram [ECG] [EKG]: Secondary | ICD-10-CM

## 2009-12-02 DIAGNOSIS — R42 Dizziness and giddiness: Secondary | ICD-10-CM

## 2009-12-02 DIAGNOSIS — R1013 Epigastric pain: Secondary | ICD-10-CM

## 2009-12-02 DIAGNOSIS — K5289 Other specified noninfective gastroenteritis and colitis: Secondary | ICD-10-CM

## 2009-12-02 DIAGNOSIS — K3189 Other diseases of stomach and duodenum: Secondary | ICD-10-CM | POA: Insufficient documentation

## 2009-12-02 DIAGNOSIS — R0989 Other specified symptoms and signs involving the circulatory and respiratory systems: Secondary | ICD-10-CM

## 2009-12-02 DIAGNOSIS — R079 Chest pain, unspecified: Secondary | ICD-10-CM | POA: Insufficient documentation

## 2009-12-04 ENCOUNTER — Telehealth: Payer: Self-pay | Admitting: Family Medicine

## 2009-12-05 ENCOUNTER — Ambulatory Visit: Payer: Self-pay | Admitting: Cardiology

## 2009-12-05 ENCOUNTER — Encounter: Payer: Self-pay | Admitting: Family Medicine

## 2009-12-05 ENCOUNTER — Encounter: Payer: Self-pay | Admitting: Adult Health

## 2009-12-05 ENCOUNTER — Encounter (INDEPENDENT_AMBULATORY_CARE_PROVIDER_SITE_OTHER): Payer: Self-pay | Admitting: *Deleted

## 2009-12-06 ENCOUNTER — Ambulatory Visit (HOSPITAL_COMMUNITY): Admission: RE | Admit: 2009-12-06 | Discharge: 2009-12-06 | Payer: Self-pay | Admitting: Family Medicine

## 2009-12-06 LAB — CONVERTED CEMR LAB
TSH: 0.502 microintl units/mL (ref 0.350–4.500)
Vit D, 25-Hydroxy: 21 ng/mL — ABNORMAL LOW (ref 30–89)

## 2009-12-06 IMAGING — US US CAROTID DUPLEX BILAT
1 series · 13 of 24 positions shown · non-contrast
Comparison: None.

CLINICAL DATA: Asymptomatic carotid bruit.  Dizziness.

BILATERAL CAROTID DUPLEX ULTRASOUND [DATE]:
TECHNIQUE: Gray scale imaging, color Doppler and duplex ultrasound
was performed of bilateral carotid and vertebral arteries in the
neck.

[Series 1: us carotid duplex bilat · 0.05mm/px · 13 of 72 slices shown]
[im 1/72]
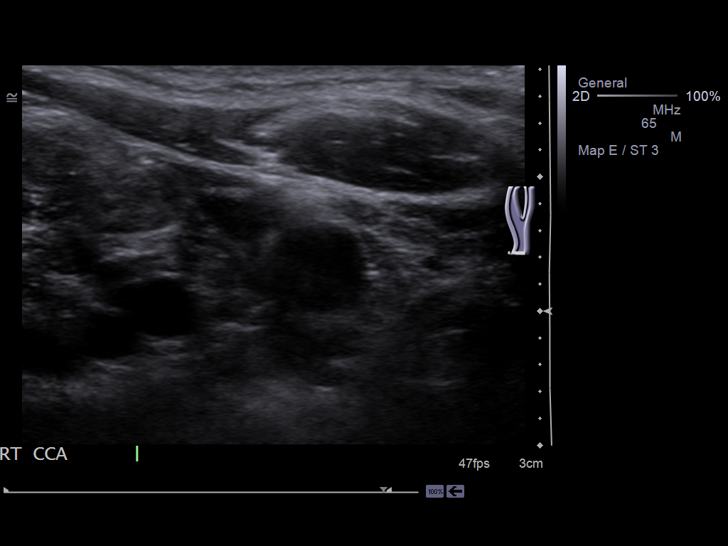
[im 7/72]
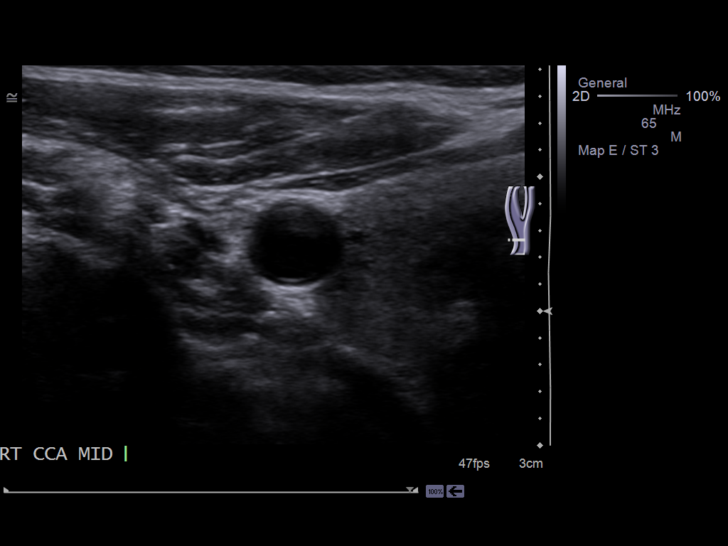
[im 13/72]
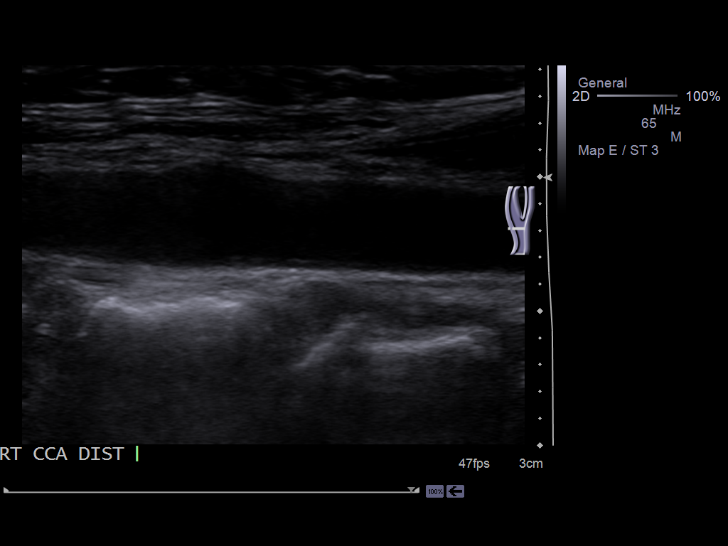
[im 19/72]
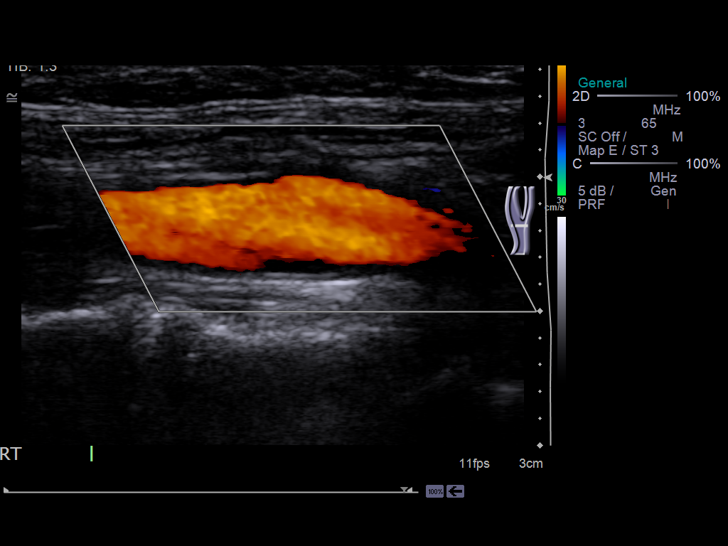
[im 25/72]
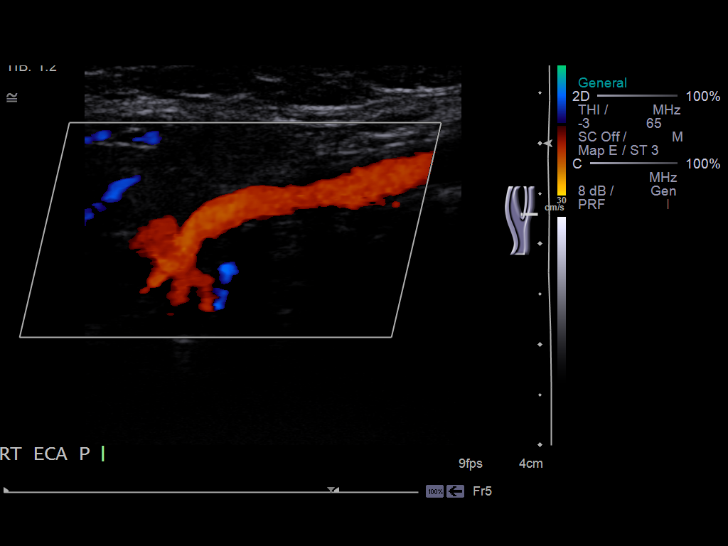
[im 31/72]
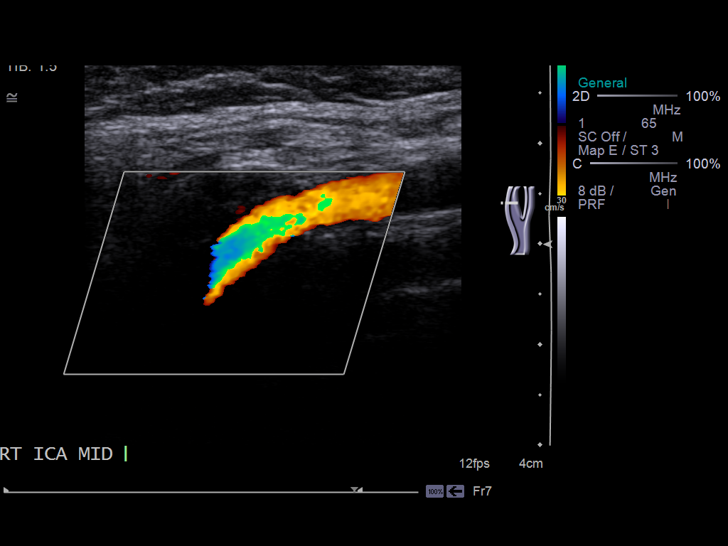
[im 38/72]
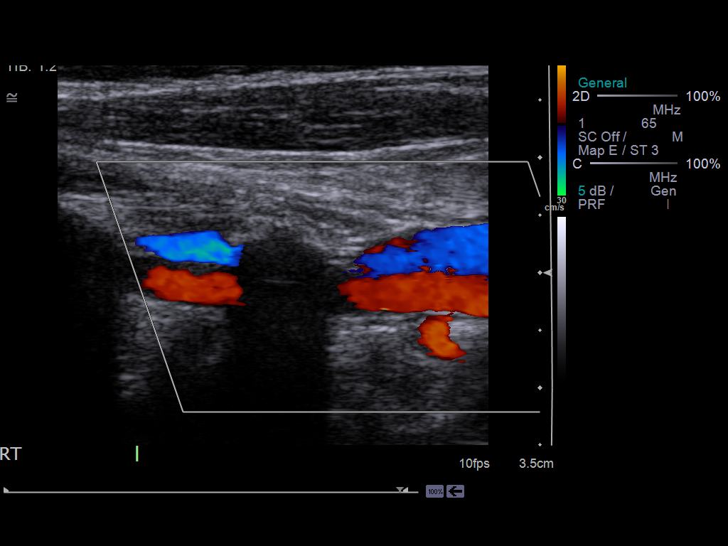
[im 41/72]
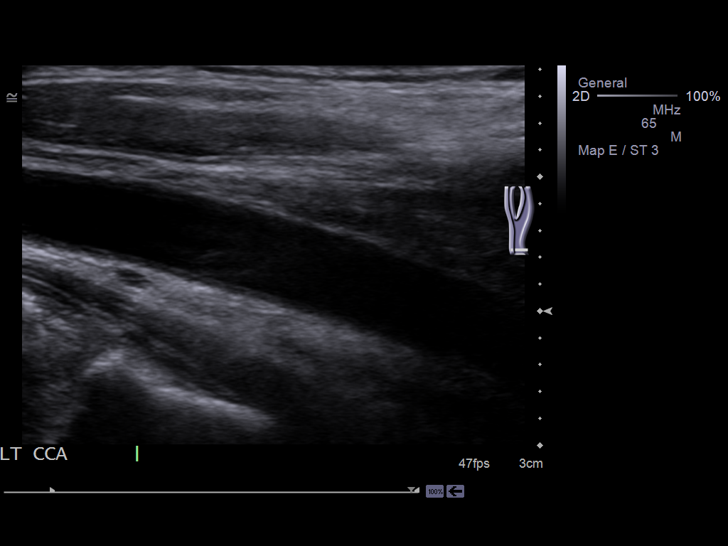
[im 47/72]
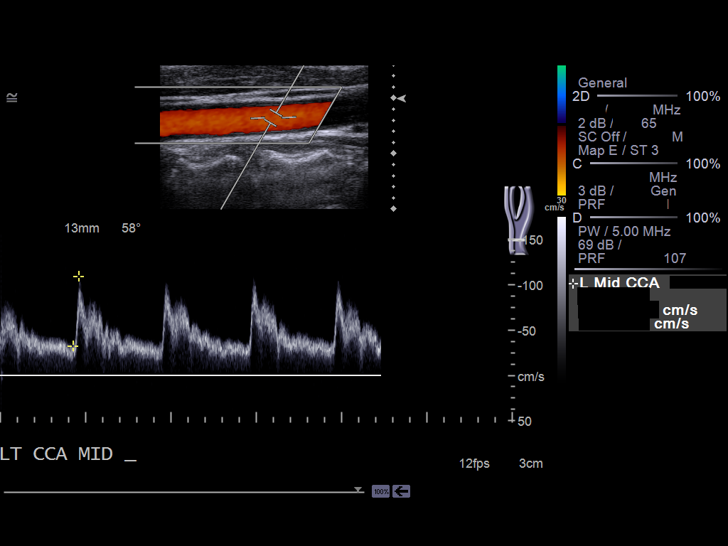
[im 53/72]
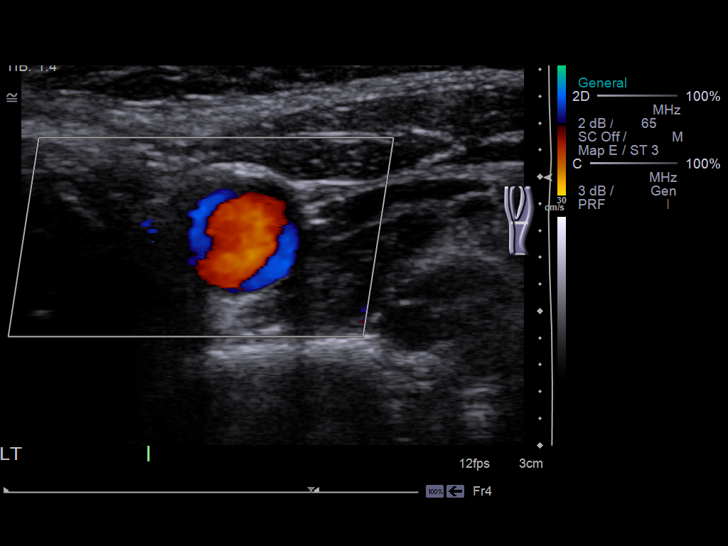
[im 59/72]
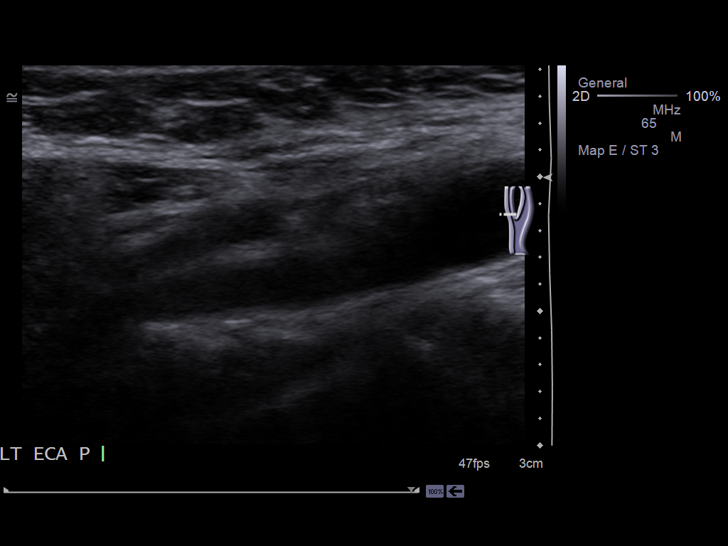
[im 65/72]
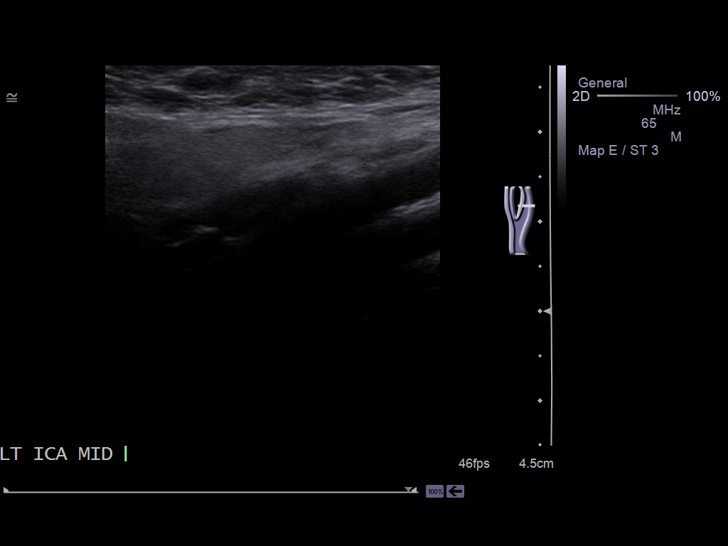
[im 72/72]
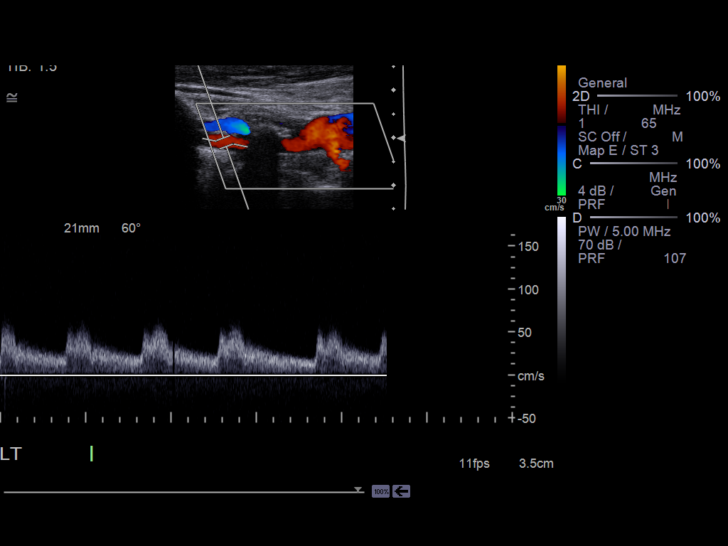

[13 of 24 positions shown; findings below may reference images not displayed]

Criteria:  Quantification of carotid stenosis is based on velocity
parameters that correlate the residual internal carotid diameter
with NASCET-based stenosis levels, using the diameter of the distal
internal carotid lumen as the denominator for stenosis measurement.

The following velocity measurements were obtained:

                 PEAK SYSTOLIC/END DIASTOLIC
RIGHT
ICA:                        85/26cm/sec
CCA:                        89/25cm/sec
SYSTOLIC ICA/CCA RATIO:
DIASTOLIC ICA/CCA RATIO:
ECA:                        81/12cm/sec

LEFT
ICA:                        117/32cm/sec
CCA:                        113/28cm/sec
SYSTOLIC ICA/CCA RATIO:
DIASTOLIC ICA/CCA RATIO:
ECA:                        93/18cm/sec
FINDINGS: RIGHT CAROTID ARTERY: Intimal thickening and scattered areas of
noncalcified plaque within the CCA extending into the proximal ICA.
No visible stenosis of greater than 50% diameter at gray scale or
color imaging involving the right carotid circulation.  No
significant spectral broadening involving the ICA waveform.

RIGHT VERTEBRAL ARTERY:  Antegrade flow.

LEFT CAROTID ARTERY: Intimal thickening involving the CCA.  No
visible stenosis of greater than 50% diameter at gray scale or
color imaging involving the left carotid circulation.  No
significant spectral broadening involving the ICA waveform.

LEFT VERTEBRAL ARTERY:  Antegrade flow.
IMPRESSION: 1.  No evidence of hemodynamically significant stenosis involving
either the right or left carotid circulation in the neck by Doppler
criteria.
2.  Mild intimal thickening involving both CCAs.  Mild noncalcified
plaque within the right CCA.
3.  Antegrade flow in both vertebral arteries.

## 2009-12-09 ENCOUNTER — Encounter: Payer: Self-pay | Admitting: Family Medicine

## 2009-12-10 ENCOUNTER — Encounter (INDEPENDENT_AMBULATORY_CARE_PROVIDER_SITE_OTHER): Payer: Self-pay | Admitting: *Deleted

## 2009-12-13 ENCOUNTER — Encounter: Payer: Self-pay | Admitting: Family Medicine

## 2009-12-13 ENCOUNTER — Ambulatory Visit: Payer: Self-pay | Admitting: Cardiology

## 2009-12-13 ENCOUNTER — Ambulatory Visit (HOSPITAL_COMMUNITY): Admission: RE | Admit: 2009-12-13 | Discharge: 2009-12-13 | Payer: Self-pay | Admitting: Cardiology

## 2009-12-13 ENCOUNTER — Encounter: Payer: Self-pay | Admitting: Cardiology

## 2009-12-25 ENCOUNTER — Ambulatory Visit: Payer: Self-pay | Admitting: Cardiology

## 2010-04-14 ENCOUNTER — Encounter: Payer: Self-pay | Admitting: Family Medicine

## 2010-04-15 LAB — CONVERTED CEMR LAB
Cholesterol: 166 mg/dL (ref 0–200)
HDL: 67 mg/dL (ref >39)
Vit D, 25-Hydroxy: 28 ng/mL — ABNORMAL LOW (ref 30–89)

## 2010-04-18 ENCOUNTER — Ambulatory Visit: Payer: Self-pay | Admitting: Family Medicine

## 2010-04-18 DIAGNOSIS — M899 Disorder of bone, unspecified: Secondary | ICD-10-CM | POA: Insufficient documentation

## 2010-04-18 DIAGNOSIS — M25559 Pain in unspecified hip: Secondary | ICD-10-CM

## 2010-04-18 DIAGNOSIS — M549 Dorsalgia, unspecified: Secondary | ICD-10-CM

## 2010-04-18 DIAGNOSIS — M949 Disorder of cartilage, unspecified: Secondary | ICD-10-CM

## 2010-04-22 ENCOUNTER — Ambulatory Visit (HOSPITAL_COMMUNITY): Admission: RE | Admit: 2010-04-22 | Discharge: 2010-04-22 | Payer: Self-pay | Admitting: Family Medicine

## 2010-04-22 IMAGING — CR DG HIP COMPLETE 2+V*R*
3 series · 3 of 3 positions shown · non-contrast
Comparison: [DATE].

CLINICAL DATA: Back pain.  Right hip pain.

RIGHT HIP - COMPLETE 2+ VIEW

[view not recorded (1 of 3)]
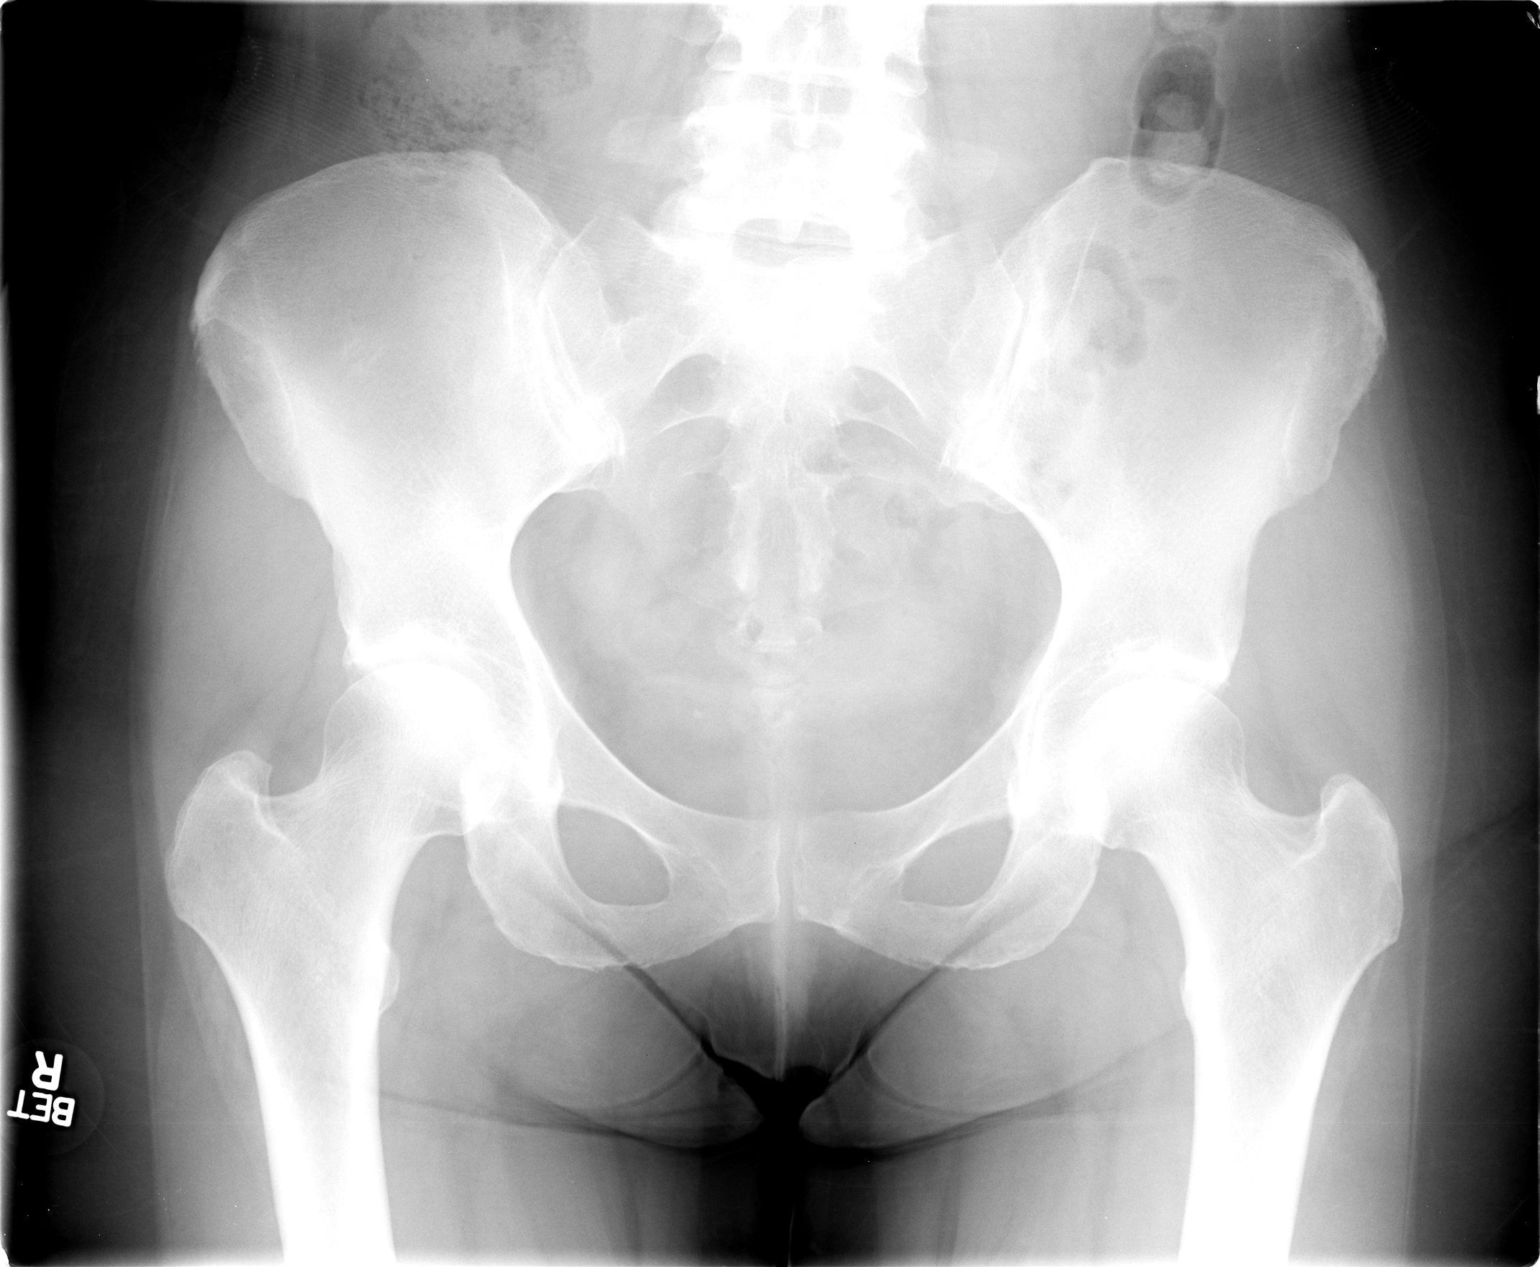

[view not recorded (2 of 3)]
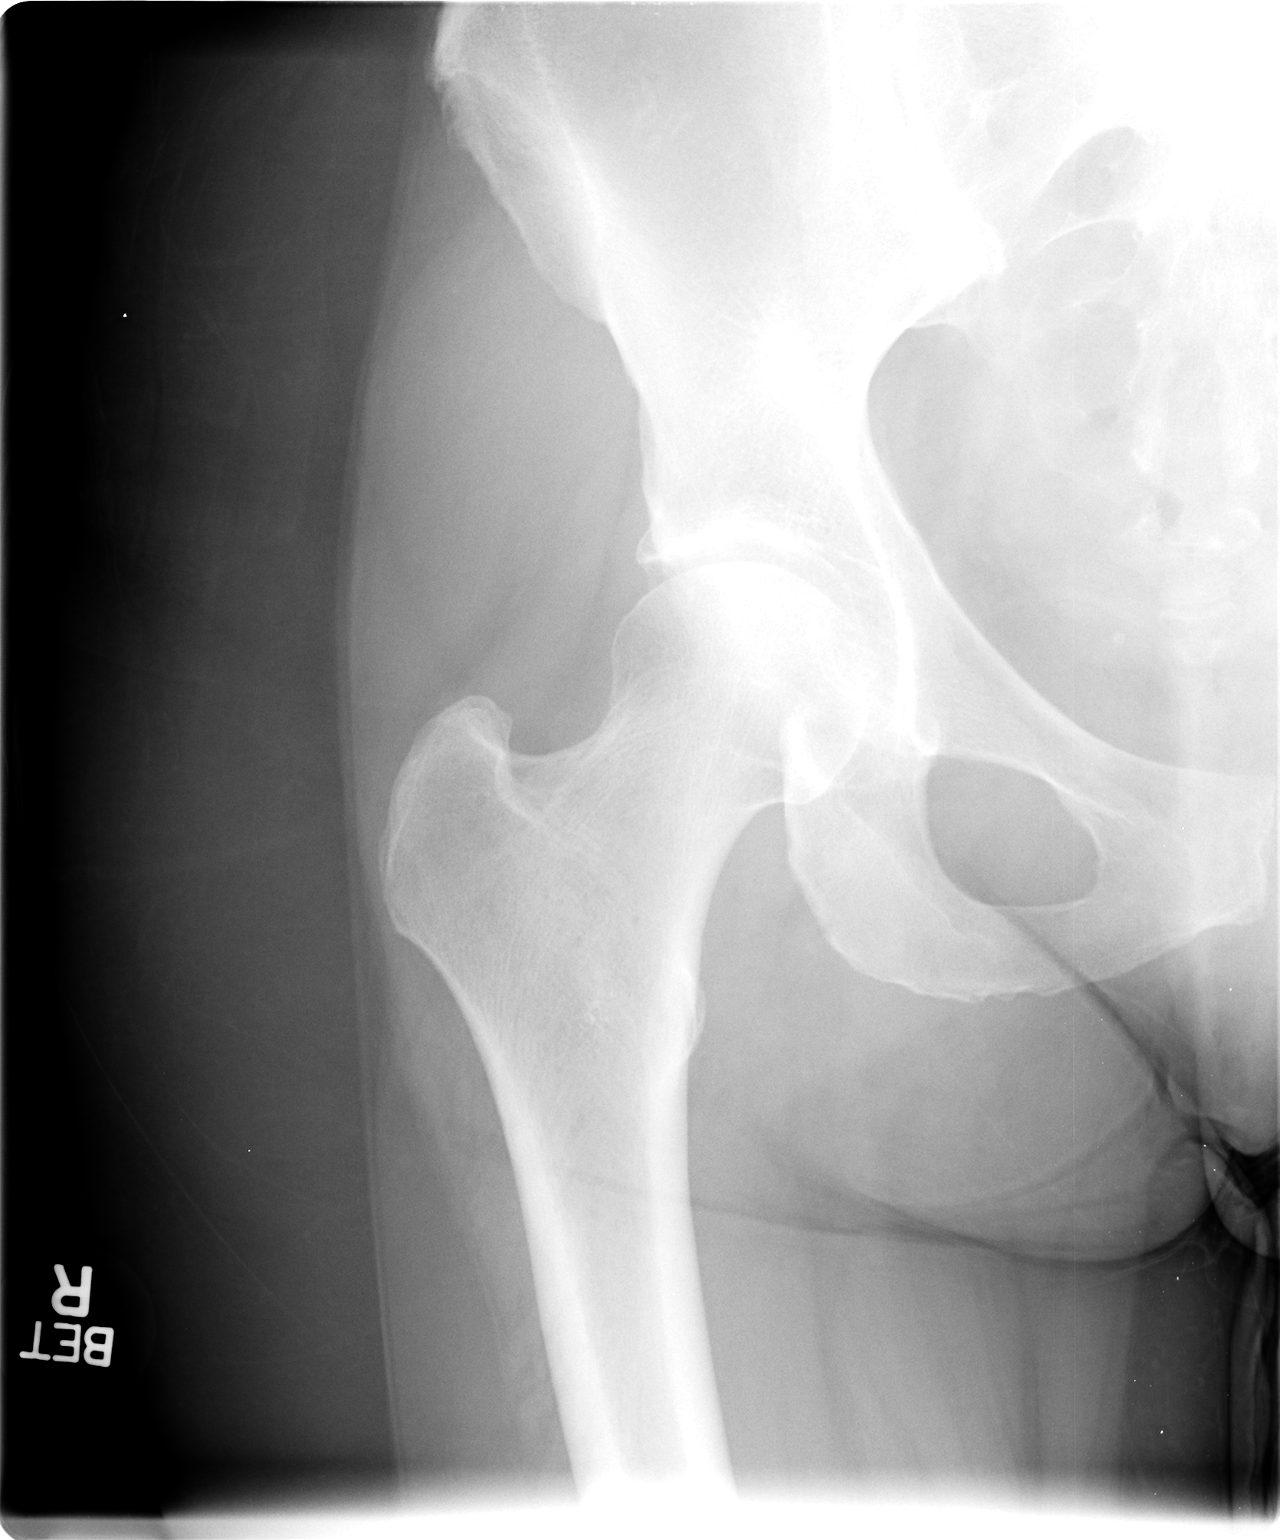

[view not recorded (3 of 3)]
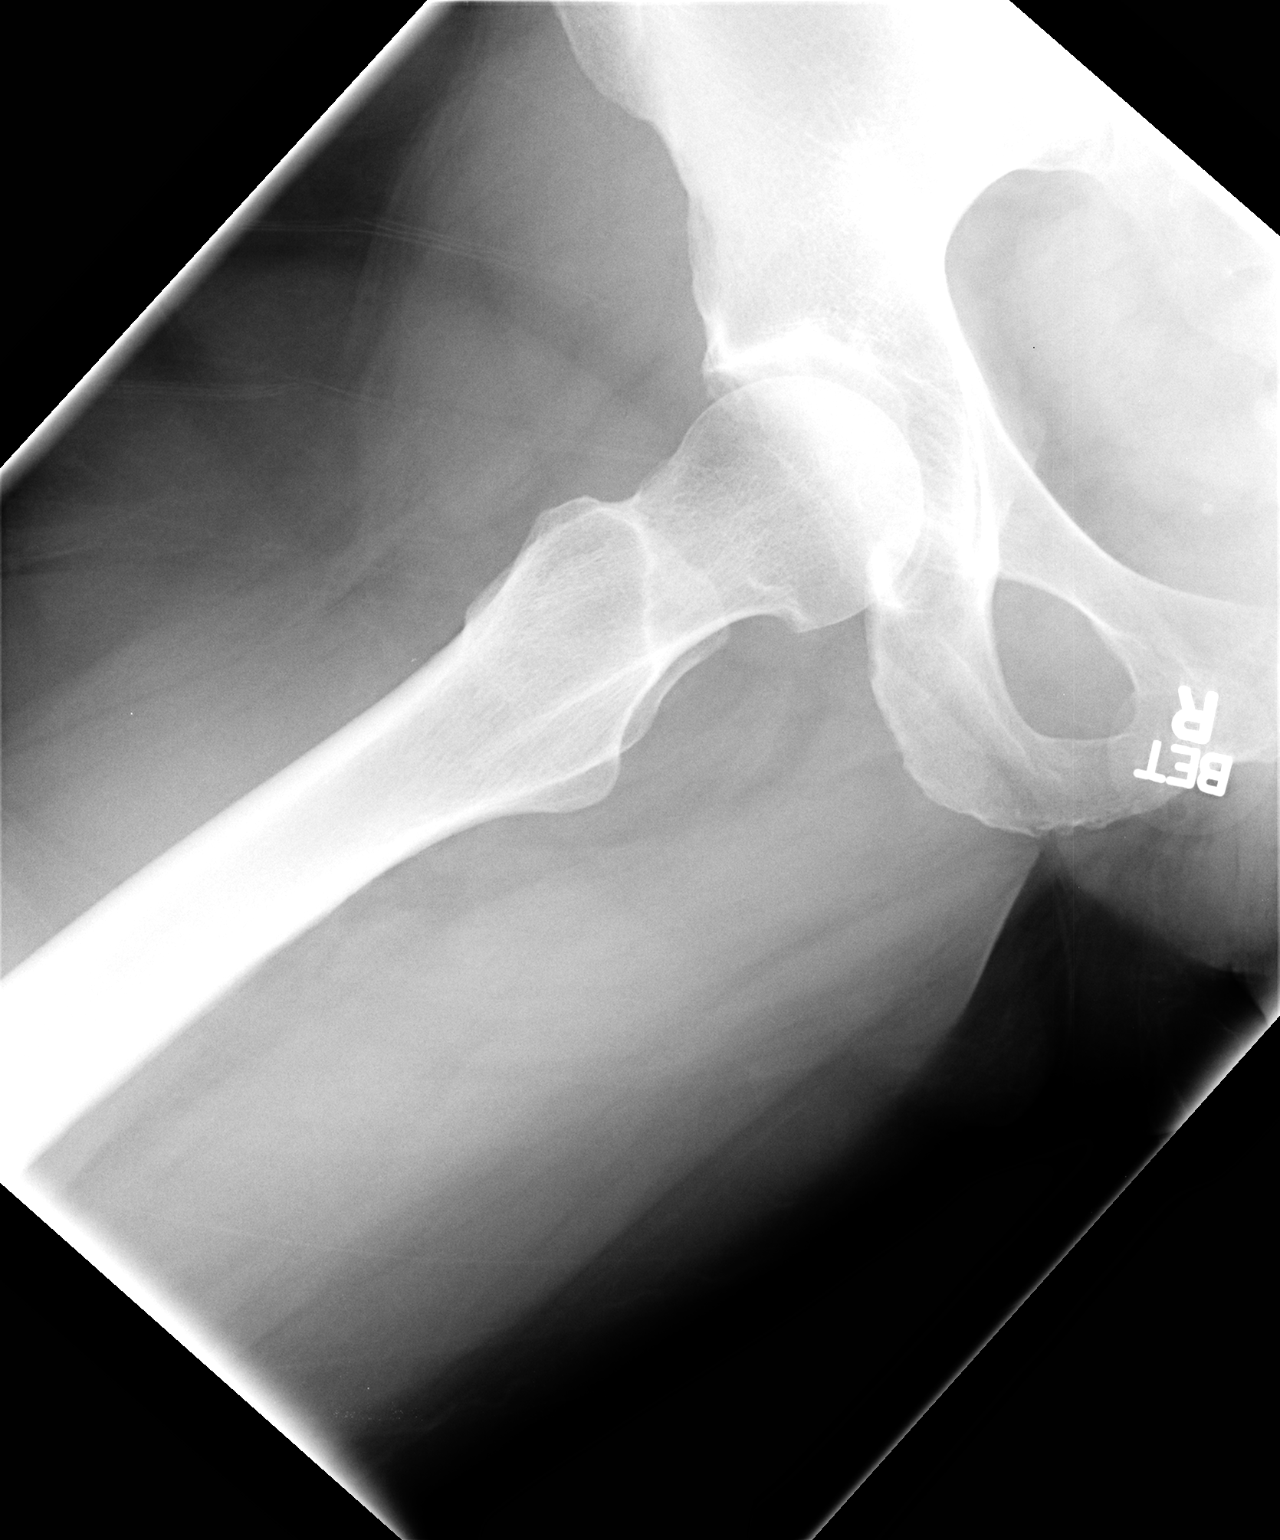

[3 of 3 positions shown; findings below may reference images not displayed]

FINDINGS: Pelvic rings intact.  SI joints and pubic symphysis
normal.  Hip joint spaces are preserved bilaterally.  No fracture.
No destructive osseous lesions.  No interval change from [MU].
IMPRESSION: Negative.

## 2010-04-22 IMAGING — CR DG LUMBAR SPINE COMPLETE 4+V
5 series · 5 of 5 positions shown · non-contrast
Comparison: [DATE].

CLINICAL DATA: Back pain.  Right hip pain.

LUMBAR SPINE - COMPLETE 4+ VIEW

[view not recorded (1 of 5)]
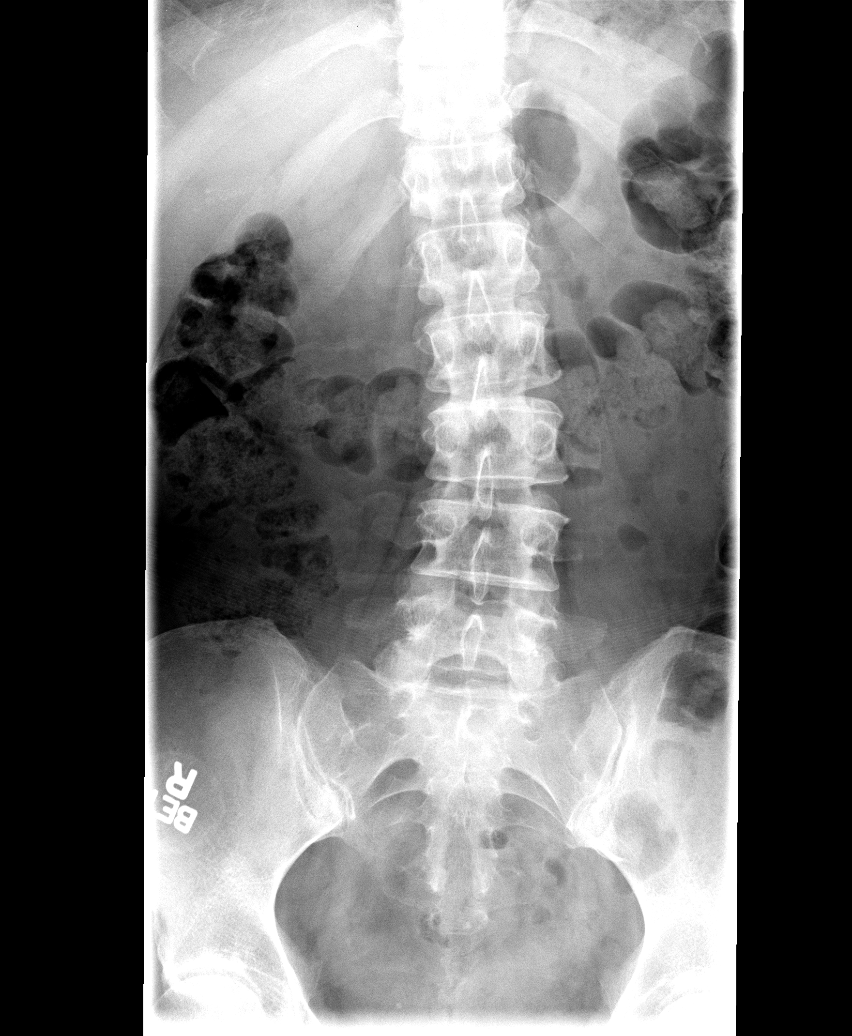

[view not recorded (2 of 5)]
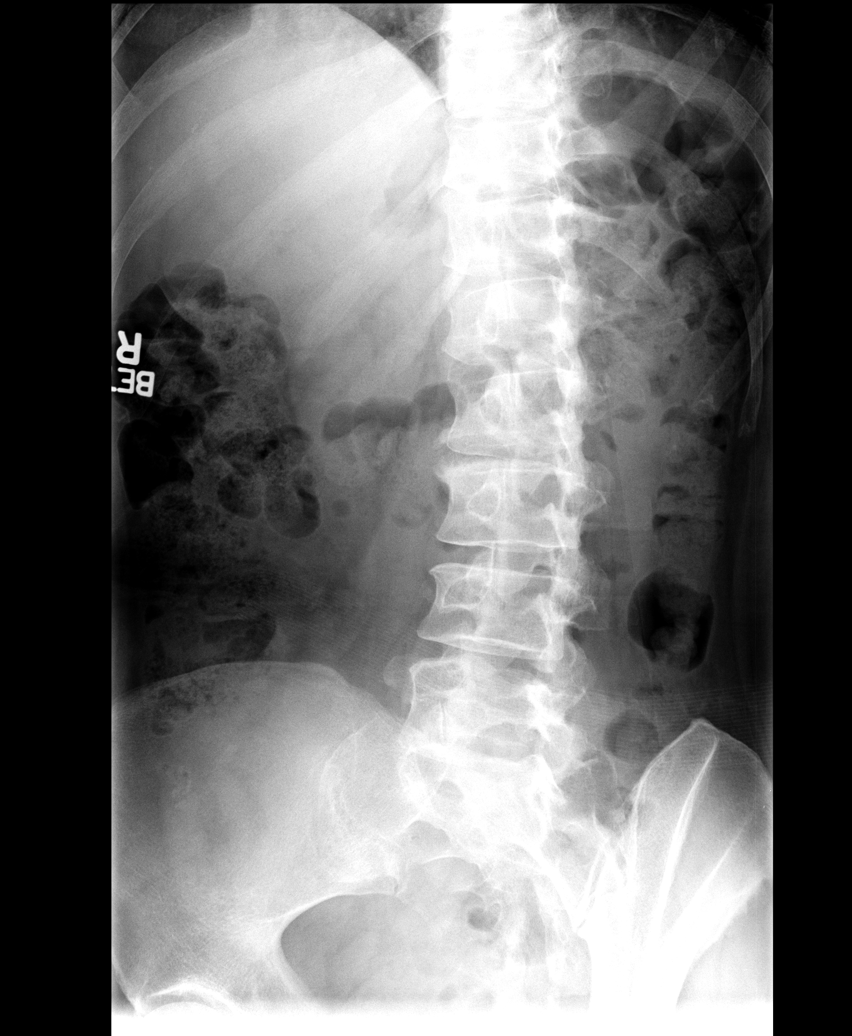

[view not recorded (3 of 5)]
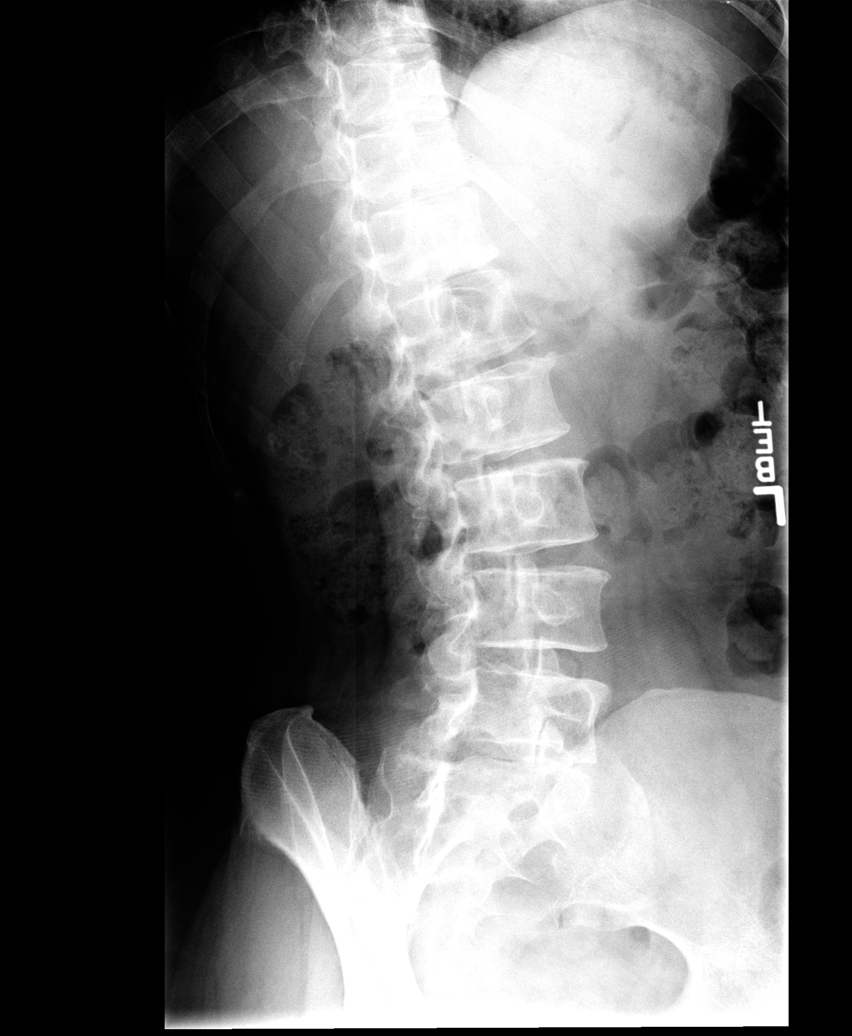

[view not recorded (4 of 5)]
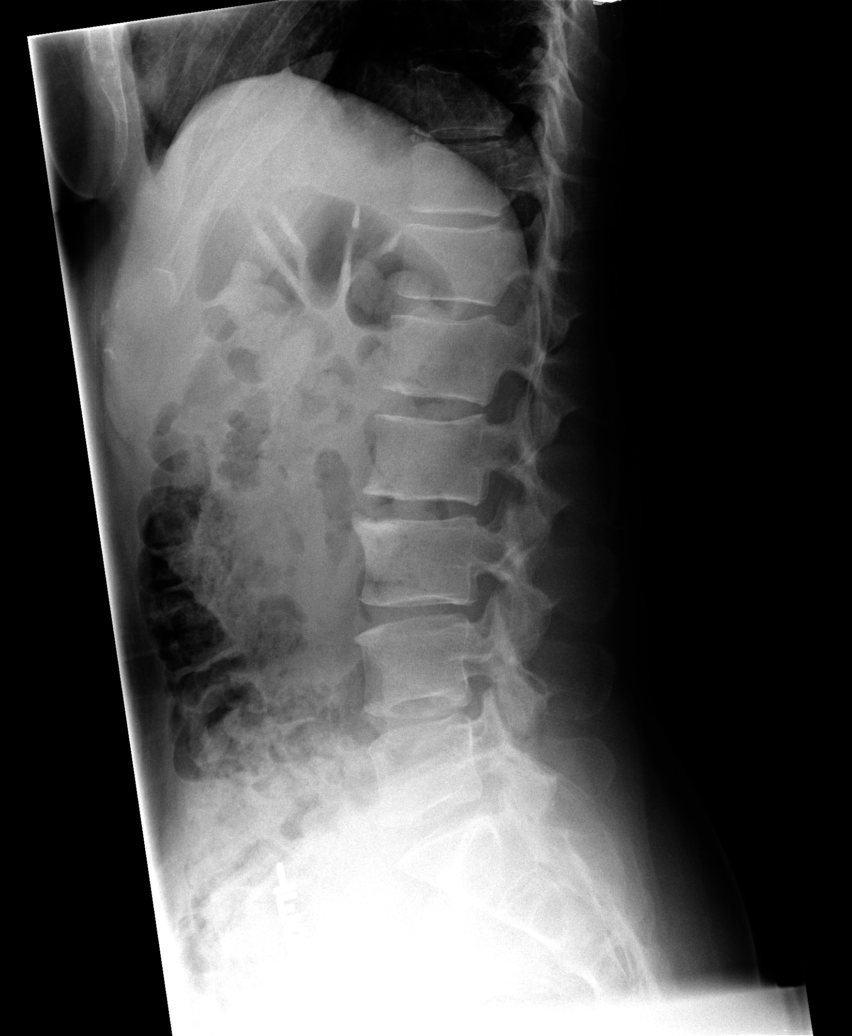

[view not recorded (5 of 5)]
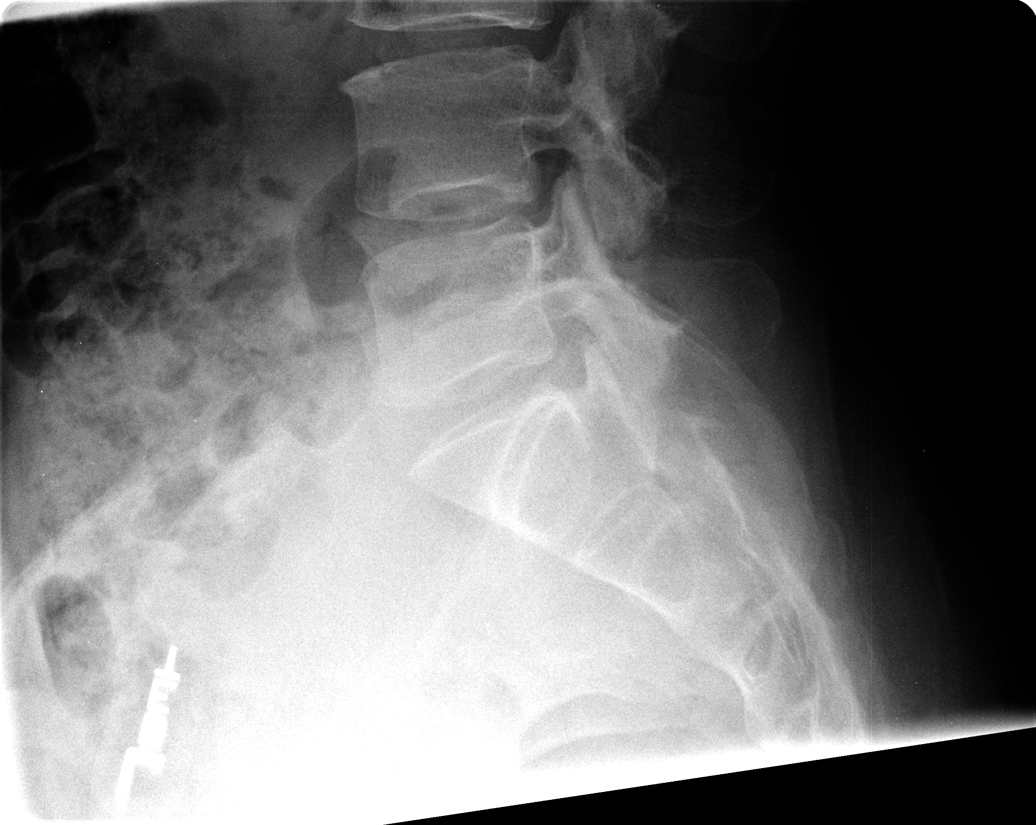

[5 of 5 positions shown; findings below may reference images not displayed]

FINDINGS: Five lumbar type vertebral bodies are present.
Levoconvex lumbar scoliosis with apex at L3.  No pars defects.
Lumbosacral junction normal.  Degenerative disc disease with
endplate sclerosis at L2-L3.  Mild loss of disc space.  Lumbosacral
junction normal.  Mild multilevel spondylosis.
IMPRESSION: 1.  Mild multilevel lumbar spondylosis most pronounced at L2-L3.
2.  Worsening levoconvex scoliosis with the apex at L3.

## 2010-04-24 ENCOUNTER — Telehealth: Payer: Self-pay | Admitting: Family Medicine

## 2010-04-25 ENCOUNTER — Ambulatory Visit (HOSPITAL_COMMUNITY): Admission: RE | Admit: 2010-04-25 | Discharge: 2010-04-25 | Payer: Self-pay | Admitting: Family Medicine

## 2010-04-30 ENCOUNTER — Encounter: Payer: Self-pay | Admitting: Family Medicine

## 2010-05-02 ENCOUNTER — Ambulatory Visit (HOSPITAL_COMMUNITY): Admission: RE | Admit: 2010-05-02 | Discharge: 2010-05-02 | Payer: Self-pay | Admitting: Family Medicine

## 2010-05-02 IMAGING — MG MM DIGITAL SCREENING
5 series · 5 of 5 positions shown · non-contrast
Comparison: Prior studies.

DG SCREEN MAMMOGRAM BILATERAL
Bilateral CC and MLO view(s) were taken.

DIGITAL SCREENING MAMMOGRAM WITH CAD:

[L CC]
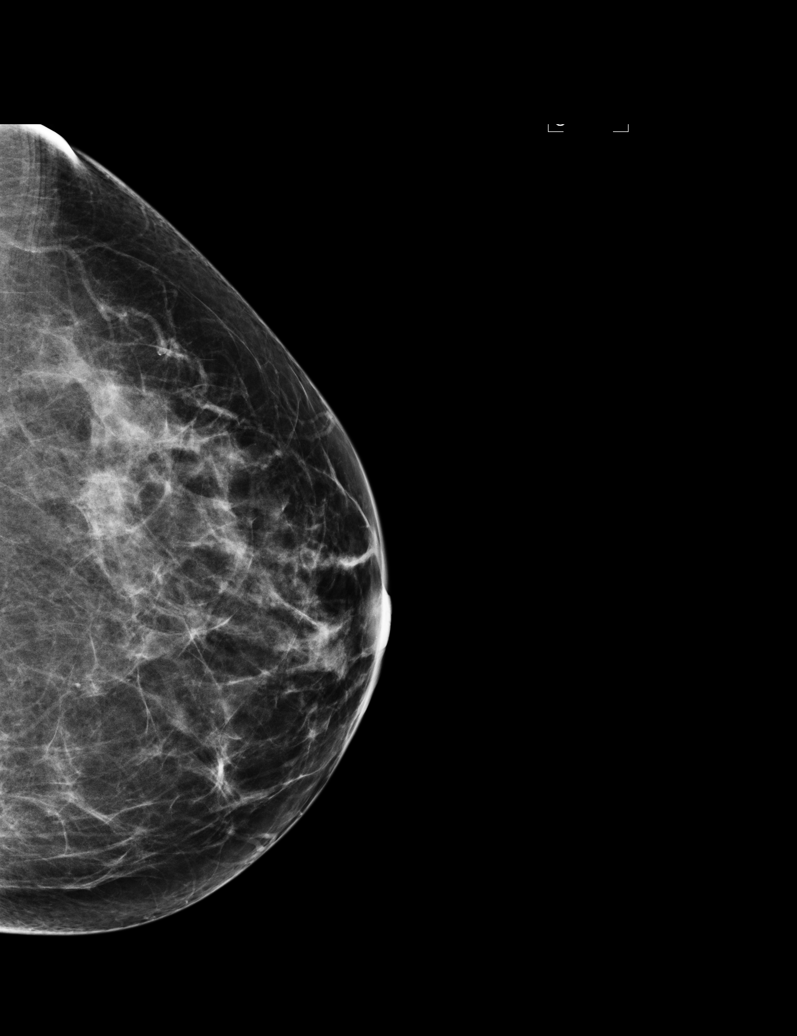

[L MLO]
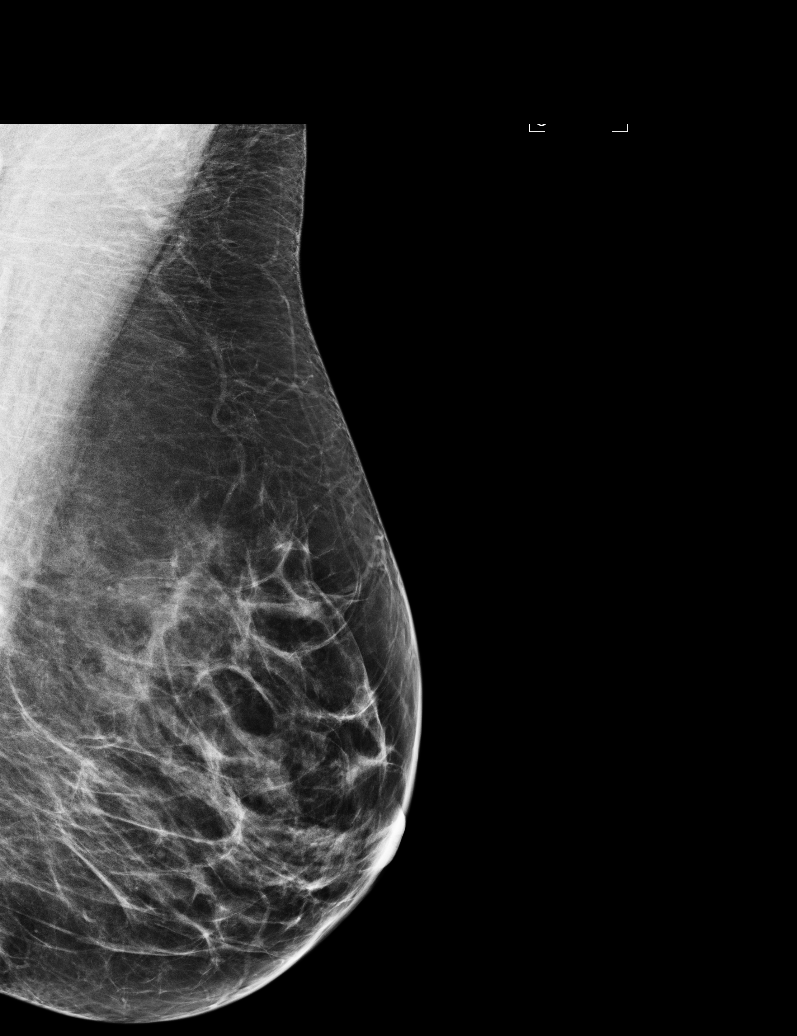

[R CC]
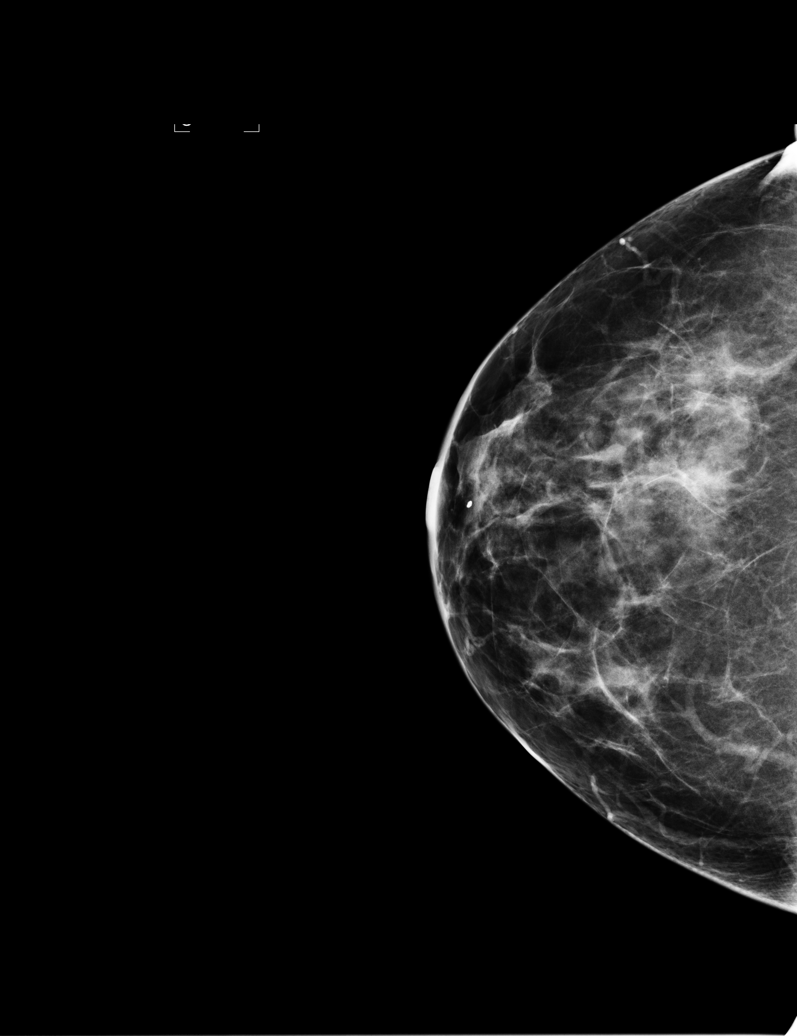

[R MLO (1 of 2)]
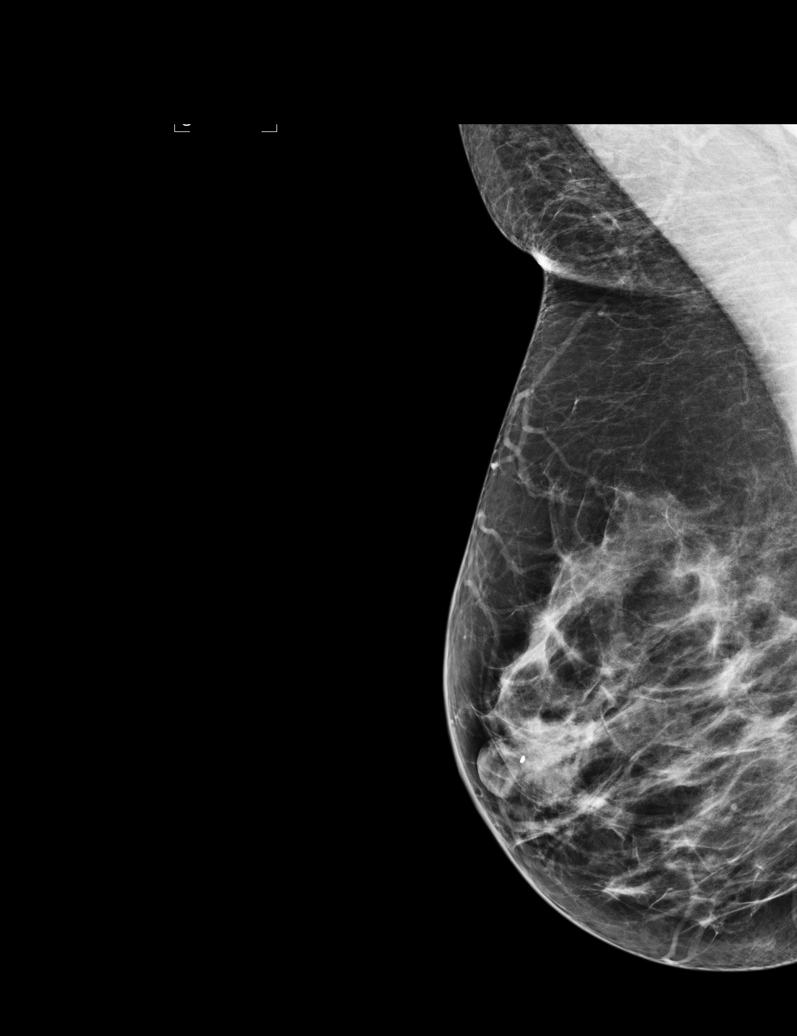

[R MLO (2 of 2)]
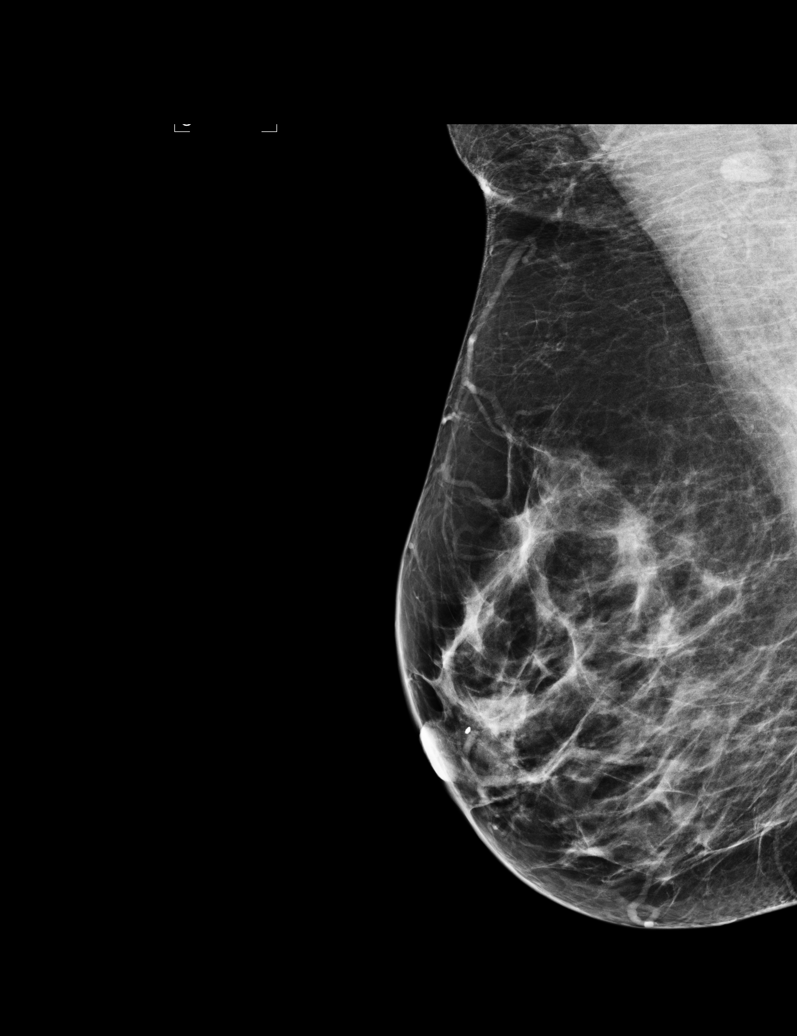

[5 of 5 positions shown; findings below may reference images not displayed]

There are scattered fibroglandular densities.  There is no dominant mass, architectural distortion 
or calcification to suggest malignancy.

Images were processed with CAD.
IMPRESSION: No mammographic evidence of malignancy.  Suggest yearly screening mammography.

A result letter of this screening mammogram will be mailed directly to the patient.

ASSESSMENT: Negative - BI-RADS 1

Screening mammogram in 1 year.
,

## 2010-06-27 ENCOUNTER — Encounter: Payer: Self-pay | Admitting: Family Medicine

## 2010-06-27 ENCOUNTER — Other Ambulatory Visit
Admission: RE | Admit: 2010-06-27 | Discharge: 2010-06-27 | Payer: Self-pay | Source: Home / Self Care | Admitting: Obstetrics and Gynecology

## 2010-07-16 ENCOUNTER — Telehealth: Payer: Self-pay | Admitting: Family Medicine

## 2010-08-10 LAB — CONVERTED CEMR LAB
Basophils Absolute: 0 10*3/uL (ref 0.0–0.1)
Basophils Relative: 0 % (ref 0–1)
CO2: 29 meq/L (ref 19–32)
Calcium: 8.9 mg/dL (ref 8.4–10.5)
Eosinophils Absolute: 0 10*3/uL (ref 0.0–0.7)
Eosinophils Relative: 1 % (ref 0–5)
Lymphocytes Relative: 22 % (ref 12–46)
Monocytes Absolute: 0.3 10*3/uL (ref 0.1–1.0)
Monocytes Relative: 11 % (ref 3–12)
Neutrophils Relative %: 66 % (ref 43–77)
RBC: 3.85 M/uL — ABNORMAL LOW (ref 3.87–5.11)
RDW: 14.1 % (ref 11.5–15.5)
WBC: 2.8 10*3/uL — ABNORMAL LOW (ref 4.0–10.5)

## 2010-08-12 NOTE — Letter (Signed)
Summary: Out of Work  [facility]  [address]   [place], [place] [address]   Phone: [phone]  Fax: [phone]    Dec 02, 2009   Employee:  Barby A Kleinpeter    To Whom It May Concern:   For Medical reasons, please excuse the above named employee from work for the following dates:  Start:   12/02/09  End:   12/04/09 to return with no restrictions  If you need additional information, please feel free to contact our office.         Sincerely,    Syliva Overman, MD

## 2010-08-12 NOTE — Miscellaneous (Signed)
Summary: lipids,cbc,cmp per Dr. Emelda Fear  Clinical Lists Changes  Observations: Added new observation of PLATELETK/UL: 261 K/uL (07/30/2009 10:56) Added new observation of MCV: 93.8 fL (07/30/2009 10:56) Added new observation of HCT: 35.0 % (07/30/2009 10:56) Added new observation of HGB: 11.4 g/dL (47/42/5956 38:75) Added new observation of WBC COUNT: 4.0 10*3/microliter (07/30/2009 10:56) Added new observation of CALCIUM: 9.2 mg/dL (64/33/2951 88:41) Added new observation of ALBUMIN: 4.5 g/dL (66/12/3014 01:09) Added new observation of PROTEIN, TOT: 6.5 g/dL (32/35/5732 20:25) Added new observation of SGPT (ALT): 13 units/L (06/21/2009 10:56) Added new observation of SGOT (AST): 16 units/L (06/21/2009 10:56) Added new observation of ALK PHOS: 41 units/L (06/21/2009 10:56) Added new observation of BILI DIRECT: total bili  0.4 mg/dL (42/70/6237 62:83) Added new observation of CREATININE: 0.62 mg/dL (15/17/6160 73:71) Added new observation of BUN: 14 mg/dL (01/06/9484 46:27) Added new observation of BG RANDOM: 78 mg/dL (03/50/0938 18:29) Added new observation of CO2 PLSM/SER: 24 meq/L (06/21/2009 10:56) Added new observation of CL SERUM: 107 meq/L (06/21/2009 10:56) Added new observation of K SERUM: 4.3 meq/L (06/21/2009 10:56) Added new observation of NA: 141 meq/L (06/21/2009 10:56) Added new observation of HDL: 63 mg/dL (93/71/6967 89:38) Added new observation of TRIGLYC TOT: 50 mg/dL (04/28/5101 58:52) Added new observation of CHOLESTEROL: 164 mg/dL (77/82/4235 36:14) Added new observation of PLATELETK/UL: 260 K/uL (06/21/2009 10:56) Added new observation of MCV: 91.9 fL (06/21/2009 10:56) Added new observation of HCT: 32.8 % (06/21/2009 10:56) Added new observation of HGB: 11.8 g/dL (43/15/4008 67:61) Added new observation of WBC COUNT: 3.6 10*3/microliter (06/21/2009 10:56)

## 2010-08-12 NOTE — Letter (Signed)
Summary: [place] [employer] [facility], [facility] at [facility]  [address] [place] [address]   Phone: [phone]  Fax: [phone]      Dec 10, 2009 MRN: [id number]   [facility] [address] [place], [place]  [address]   Dear Ms. Tocci,  Your test ordered by Selena Batten has been reviewed by your physician (or physician assistant) and was found to be normal or stable. Your physician (or physician assistant) felt no changes were needed at this time.  ____ Echocardiogram  ____ Cardiac Stress Test  ____ Lab Work  __x__ Peripheral vascular study of arms, legs or neck  ____ CT scan or X-ray  ____ Lung or Breathing test  ____ Other: No change in medical treatment at this time, per Dr. Dietrich Pates.  Enclosed is a copy of your test for your records.   Thank you, Brandyn Allyne Gee RN    [place] Bing, MD, Lenise Arena.C.Gaylord Shih, MD, F.A.C.C Lewayne Bunting, MD, F.A.C.C Nona Dell, MD, F.A.C.C Charlton Haws, MD, Lenise Arena.C.C

## 2010-08-12 NOTE — Progress Notes (Signed)
Summary: referral  Phone Note From Other Clinic   Summary of Call: pt has appt for 04/25/2010 at [facility] for dexa. pt called back and was told it was this friday Initial call taken by: Rudene Anda,  April 24, 2010 8:43 AM  Follow-up for Phone Call        10/14 is this Friday. Did you mean to send this to me? Follow-up by: Everitt Amber LPN,  April 24, 2010 1:58 PM  Additional Follow-up for Phone Call Additional follow up Details #1::        no. i meant it for my records. sorry Additional Follow-up by: Rudene Anda,  April 25, 2010 1:51 PM

## 2010-08-12 NOTE — Assessment & Plan Note (Signed)
Summary: NEW PATIENT   Vital Signs:  Patient profile:   50 year old female Menstrual status:  irregular Height:      63.5 inches Weight:      138 pounds BMI:     24.15 O2 Sat:      99 % Temp:     99.1 degrees F oral Pulse rate:   87 / minute Pulse rhythm:   regular Resp:     16 per minute BP sitting:   104 / 60  (right arm) BP standing:   110 / 80  (right arm) Cuff size:   regular  Vitals Entered By: Everitt Amber LPN (Dec 02, 2009 11:23 AM) CC: Symptoms began last mon or tues, dizziness, nausea, diarrhea. No diarrhea since yesterday but still having the nausea, has a swishing sound in her head, like she can feel her heartbeat. Dizziness when she walks around      Menstrual Status irregular   CC:  Symptoms began last mon or tues, dizziness, nausea, diarrhea. No diarrhea since yesterday but still having the nausea, has a swishing sound in her head, and like she can feel her heartbeat. Dizziness when she walks around .  History of Present Illness: Sent from her job c/osubsternal chest pain at the office . She reports that since 1 week ago std experiencing light headedness with change inpositon primarily eg looking up from charting, symptoms progressively worsened and she std exp mild nausea.No syncope , or near syncope.no vertigo Symptoms became more often, initially there was no nausea, and , more severe and of longer duration. Also has a pounding in in the head ,like a  transmitted heartbeat.  Had a h/o low hb in December, rept may have been nl, uncertain  She also developed nausea, and diarreah since yesterday am to pm went 10 times, watery dk green, no bm since then peptobismol taken. no known sick contact.Felt hot with generalised body aches yesterday.  Preventive Screening-Counseling & Management  Alcohol-Tobacco     Smoking Status: never      Drug Use:  no.    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 Never Smoked Alcohol use-no Drug use-no Drug Use:  no  Review of Systems      See HPI Eyes:   . CV:  Complains of chest pain or discomfort; substernal chest pain at work today , pt unable to get up because of severity and she felt weak, duration approx 15 mins , no diaphoresis, has been lightheradebness. Resp:  Denies cough, shortness of breath, and sputum productive. GI:  Complains of abdominal pain, diarrhea, and nausea; denies vomiting. GU:  Complains of  abnormal vaginal bleeding; denies dysuria and urinary frequency; irreg mensesx 2 to 3 yrs, perimenopausal. MS:  Denies joint pain and stiffness. Derm:  Denies itching, lesion(s), and rash. Neuro:  Denies falling down, headaches, seizures, and sensation of room spinning. Psych:  Complains of depression; improvedsince December, off meds, also may be hormonal linked. Endo:  Denies excessive thirst and excessive urination. Heme:  Denies abnormal bruising and bleeding. Allergy:  Complains of seasonal allergies; mild to  moderate.  Physical Exam  General:  Well-developed,well-nourished,in no acute distress; alert,appropriate and cooperative throughout examination HEENT: No facial asymmetry,  EOMI, No sinus tenderness, TM's Clear, oropharynx  pink and moist. possible carotid bruits  Chest: Clear to auscultation bilaterally. No reproducible chest wall tenderness. CVS: S1, S2, No murmurs, No S3.   Abd: Soft,  mild epigastric tenderness. MS: Adequate ROM spine, hips, shoulders and knees.  Ext: No edema.   CNS: CN 2-12 intact, power tone and sensation normal throughout.   Skin: Intact, no visible lesions or rashes.  Psych: Good eye contact, normal affect.  Memory intact, not anxious or depressed appearing.     Impression & Recommendations:  Problem # 1:  CAROTID BRUIT (ICD-785.9) Assessment Comment Only  Orders: Radiology Referral (Radiology)  Problem # 2:  NONSPECIFIC ABNORMAL ELECTROCARDIOGRAM (ICD-794.31) Assessment: Comment Only  refer to card sinceptis also symptomatic with lightheadedness, maternal grandfatherhadcAd under age 44 also  Orders: Cardiology Referral (Cardiology) Cardiology Referral (Cardiology)  Problem # 3:  GASTROENTERITIS, ACUTE (ICD-558.9) Assessment: Comment Only progressive nausea x 1 week with worse symptoms 1 day ago , body aches and chills, Brat diet and work excuse  Problem # 4:  DYSPEPSIA (ICD-536.8) Assessment: Comment Only  Orders: TLB-H. Pylori Abs(Helicobacter Pylori) (86677-HELICO) Nexium 40 mg one daily x7 , rxgiven  Complete Medication List: 1)  Advil  2)  Nexium 40 Mg Cpdr (Esomeprazole magnesium) .... Take 1 capsule by mouth once a day 3)  Klor-con M20 20 Meq Cr-tabs (Potassium chloride crys cr) .... One tab by mouth two times a day x 5 days,  then one tab by mouth once daily x 4 weeks  Other Orders: T-Basic Metabolic Panel [phone]) T-CBC w/Diff [phone]) T-TSH [id number]) T-Lipid Profile [phone]) T-Vitamin D (25-Hydroxy)  [phone]) EKG w/ Interpretation (93000) T-Troponin I ([id number])  Patient Instructions: 1)  Please schedule a follow-up appointment in 2 months. 2)  BMP prior to visit, ICD-9: 3)  Lipid Panel prior to visit, ICD-9: 4)  TSH prior to visit, ICD-9: 5)  CBC w/ Diff prior to visit, ICD-9:  fasting labs 6)  vitamin d 7)  h pylori tropnin level  8)    you will be referred for carotid` dfoppler.also to the cardiologist. 9)  work excuseto retuen May 25. 10)  Oral Rehydration Solution: drink 1/2 ounce every 15 minutes. If tolerated afert 1 hour, drink 1 ounce every 15 minutes. As you can tolerate, keep adding 1/2 ounce every 15 minutes, up to a total of 2-4 ounces. Contact the office if unable to tolerate oral solution, if you keep vomiting, or you continue to have signs of dehydration. Prescriptions: KLOR-CON M20 20 MEQ CR-TABS (POTASSIUM CHLORIDE CRYS CR) one tab by mouth two times a day x 5 days,  then one tab by mouth once daily x 4 weeks  #40 x 0   Entered by:   Adella Hare LPN   Authorized by:   Syliva Overman MD   Signed by:   Adella Hare LPN on 41/66/0630   Method used:  Electronically to        [facility]. [phone]* (retail)       [address] [place], [place]  [address]       Ph: [phone]       Fax: [phone]   RxID:   [id number] NEXIUM 40 MG CPDR (ESOMEPRAZOLE MAGNESIUM) Take 1 capsule by mouth once a day  #7 x 0   Entered and Authorized by:   Syliva Overman MD   Signed by:   Adella Hare LPN on 44/07/270   Method used:   Handwritten   RxID:   [id number]

## 2010-08-12 NOTE — Medication Information (Signed)
Summary: Tax adviser   Imported By: Lind Guest 12/03/2009 08:07:23  _____________________________________________________________________  External Attachment:    Type:   Image     Comment:   External Document

## 2010-08-12 NOTE — Letter (Signed)
Summary: Letter  Letter   Imported By: Lind Guest 04/30/2010 17:19:31  _____________________________________________________________________  External Attachment:    Type:   Image     Comment:   External Document

## 2010-08-12 NOTE — Progress Notes (Signed)
Summary: APPT.  Phone Note Call from Patient   Summary of Call:  HAD A EKG DONE HERE ON MONDAY AND WANTS TO KNOW DOES SHE STILL NEED TO GO TO HER CARD. APPT. TOMORROW  HAS ANYONE READ HER EKG IS IT NECESSARY FOR TO GO TO THE APPT. CALL BACK AT [phone] OR [phone] Initial call taken by: Lind Guest,  Dec 04, 2009 10:10 AM  Follow-up for Phone Call        called patient, left detailed message Follow-up by: Adella Hare LPN,  Dec 04, 2009 5:06 PM

## 2010-08-12 NOTE — Letter (Signed)
Summary: Stress Echocardiogram Information Sheet  [facility]  [address] [place] [address]   Phone: [phone]  Fax: [phone]      Dec 05, 2009 MRN: [id number] light prior to the test.   Holly Murphy  Doctor: Appointment Date: Appointment Time: Appointment Location: [facility]  Stress Echocardiogram Information Sheet    Instructions:   1. DO NOT  take your am medicine the morning before the test.  2. Nothing to eat or drink after midnight  3. Dress prepared to exercise.  4. DO NOT use ANY caffine or tobacco products 3 hours before appointment.  5. Report to the Short Stay Center on the1st floor.  6. Please bring all current prescription medications.  7. If you have any questions, please call [phone]

## 2010-08-12 NOTE — Assessment & Plan Note (Signed)
Summary: 2 wk f/u per checkout on 12/05/09/tg   Visit Type:  Follow-up Primary Provider:  Syliva Overman MD  CC:  no cardiology complaints.  History of Present Illness: 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  She was last seen with complaints of dizziness and mild chest discomfort.  She his here for test result follow-up after having had a stress echo.  She has not had any significant recurrances of dizziness or chest discomfort since last visit.  Test results demonstrate: Stress ECG conclusions: The stress ECG was normal manifesting insignificant upsloping ST segment depression. Probably normal study within technical limitations somewhat  tempering that conclusion. Per Dr. Dietrich Pates.   Current Medications (verified): 1)  Advil 2)  Nexium 40 Mg Cpdr (Esomeprazole Magnesium) .... Take 1 Capsule By Mouth Once A Day 3)  Vitamin D (Ergocalciferol) 50000 Unit Caps (Ergocalciferol) .... Take One Tab Once A Week  Allergies (verified): No Known Drug Allergies  Review of Systems       All other systems have been reviewed and are negative unless stated above.   Vital Signs:  Patient profile:   50 year old female Menstrual status:  irregular Weight:      138 pounds Pulse rate:   50 / minute BP sitting:   116 / 68  (right arm)  Vitals Entered By: Dreama Saa, CNA (December 25, 2009 1:36 PM)   Impression & Recommendations:  Problem # 1:  CHEST PAIN (ICD-786.50) Stress Echo is reassuring in this pleasant lady.  She has had one episode of dizziness when holding her head back hanging a shower curtain.  Otherwise negative.  I have given her a copy of the test results and advised her to follow - up with ENT or neurologist should the symptoms continue.  Carotid studies have been reviewed. Taking 81mg  ASA is fine.   She will follow up on a as needed basis.

## 2010-08-12 NOTE — Assessment & Plan Note (Signed)
Summary: NP6 ABNORMAL EKG   Visit Type:  Follow-up Primary Provider:  Syliva Overman MD  CC:  no cardiology complaints.  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 Lodema Hong for evaluation of heartburn symptoms.  She has no cardiovascular risk factors, no prior cardiac work-up.  She has not experience these symptoms with any relevance before one week ago.  One week ago she a bout of gastris with nausea and diarrhea.  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  She left the room and sat down.  Later she ate some crackers and drank a diet cola. The symtoms returned.,  She denies SOB, Dizziness, nausea, or diaphoresis associated with the heartburn.    Separate from this, she has noticed some dizziness which occurs while bending her head down to chart.  When she looks up from her paperwork, she experiences transient dizziness.  This has been occuring with more frequency lately.  No other associated symptoms.  Preventive Screening-Counseling & Management  Alcohol-Tobacco     Alcohol drinks/day: 0     Smoking Status: never  Current Medications (verified): 1)  Advil 2)  Nexium 40 Mg Cpdr (Esomeprazole Magnesium) .... Take 1 Capsule By Mouth Once A Day 3)  Klor-Con M20 20 Meq Cr-Tabs (Potassium Chloride Crys Cr) .... One Tab By Mouth Two Times A Day X 5 Days,  Then One Tab By Mouth Once Daily X 4 Weeks  Allergies (verified): No Known Drug Allergies PMH-FH-SH reviewed-no changes except otherwise noted  Social History: Alcohol drinks/day:  0  Review of Systems       Dizziness  All other systems have been reviewed and are negative unless stated above.   Vital Signs:  Patient profile:   50 year old female Menstrual status:  irregular Weight:      137 pounds Pulse rate:   64 / minute BP sitting:   104 / 69  (right  arm)  Vitals Entered By: Dreama Saa, CNA (Dec 05, 2009 1:18 PM)  Physical Exam  General:  Well developed, well nourished, in no acute distress. Head:  normocephalic and atraumatic Eyes:  PERRLA/EOM intact; conjunctiva and lids normal. Ears:  TM's intact and clear with normal canals and hearing Nose:  no deformity, discharge, inflammation, or lesions Mouth:  Teeth, gums and palate normal. Oral mucosa normal. Neck:  Neck supple, no JVD. No masses, thyromegaly or abnormal cervical nodes. Lungs:  Clear bilaterally to auscultation and percussion. Heart:  Non-displaced PMI, chest non-tender; regular rate and rhythm, S1, S2 without murmurs, rubs or gallops. Carotid upstroke normal, no bruit. Normal abdominal aortic size, no bruits. Femorals normal pulses, no bruits. Pedals normal pulses. No edema, no varicosities. Abdomen:  Bowel sounds positive; abdomen soft and non-tender without masses, organomegaly, or hernias noted. No hepatosplenomegaly. Msk:  Back normal, normal gait. Muscle strength and tone normal. Pulses:  pulses normal in all 4 extremities Extremities:  No clubbing or cyanosis. Neurologic:  Alert and oriented x 3. Psych:  Normal affect.   EKG  Procedure date:  12/05/2009  Findings:      Right bundle branch block.  Rate of 67bpm.  Possible LAE.  Impression & Recommendations:  Problem # 1:  CHEST PAIN (ICD-786.50) The chest pain does not appear to be typical for cardiac etiology.  She has very little CVRF for CAD.  FH of maternal GF died in 79's from MI. Dr. Lodema Hong is doing risk stratification with lipids, TSH.  For now our plan it is to proceed with stress echo for diagnostic/rprognostic purposes. Further testing if warrented.  Dr. Diona Browner has been in to see patient as well and agrees with this plan and assessement. Orders: Stress Echo (Stress Echo)  Problem # 2:  DIZZINESS AND GIDDINESS (ICD-780.4) This could be a component of benign posteral vertigo.  ENT evaluation  can be completed at the discretion of Dr. Lodema Hong.  Patient Instructions: 1)  Your physician recommends that you schedule a follow-up appointment in: after testing 2)  Your physician has requested that you have a stress echocardiogram. For further information please visit [email or web address]  Please follow instruction sheet as given.  Appended Document: NP6 ABNORMAL EKG Patient seen with [name].  Reviewed ECG and symptoms.  A screening stress echo has been ordered for risk stratification.

## 2010-08-12 NOTE — Assessment & Plan Note (Signed)
Summary: office visit   Vital Signs:  Patient profile:   50 year old female Menstrual status:  irregular Height:      63.5 inches Weight:      140 pounds BMI:     24.50 O2 Sat:      99 % on Room air Pulse rate:   51 / minute Pulse rhythm:   regular Resp:     16 per minute BP sitting:   110 / 58  (left arm)  Vitals Entered By: Mauricia Area, CMA  O2 Flow:  Room air CC: follow up   Primary Care Provider:  Syliva Overman MD  CC:  follow up.  History of Present Illness: Reports  that she has been doing fairly well. She continues to have significant psychosocial stress with regard to the care of her elderly mother, and situations at home Denies recent fever or chills. Denies sinus pressure, nasal congestion , ear pain or sore throat. Denies chest congestion, or cough productive of sputum. Denies chest pain, palpitations, PND, orthopnea or leg swelling. Denies abdominal pain, nausea, vomitting, diarrhea or constipation. Denies change in bowel movements or bloody stool. Denies dysuria , frequency, incontinence or hesitancy.Reports menstrual irregularity, 2 cycles this year. She c/o low back and right hip pain which often disturbs her sleep. The pain is localised,non radiating, no associated lower ext tingling or numbness, no incontinence of stool or urine.She has been to ortho in the past , not much response to steroid dose pack, told she had arthritis.Has also been to chioropracter, desire no therapy , has home exercises which she does inconsistently.Uses tylenol PM on the weekends fior good undisturbed sleep, but "hangover effect" prohibits regular use. she has also been bothered in the past with heel spur and plantar fascitis, has had local injection for this, with new footwear the symptoms are much improved. Has not been taking the vitD, and is on no calcium supplement. .Denies headaches, vertigo, seizures. Denies depression, anxiety or insomnia. Denies  rash, lesions, or  itch.     Current Medications (verified): 1)  Aleve 220 Mg Tabs (Naproxen Sodium) .... As Needed  Allergies (verified): No Known Drug Allergies  Review of Systems      See HPI General:  Complains of sleep disorder. Eyes:  Complains of vision loss-both eyes; contacts x 3 yrs. GU:  Complains of abnormal vaginal bleeding; perimenopause  ammen x 6 months. MS:  Complains of joint pain and low back pain; chromnic LBP and right hip pain x approx 3 yrs, chiropracter, steroids, anti-inflamm, no better, alleve, tylenolpm , sometimes improve sleep worse bn supine position. left plantar fasciitis and left heelp spur 1 yr ago, no current sympts injested x 1. Psych:  Denies anxiety and depression. Endo:  Denies cold intolerance, excessive thirst, excessive urination, and heat intolerance. Heme:  Denies abnormal bruising and bleeding. Allergy:  Denies seasonal allergies.  Physical Exam  General:  Well-developed,well-nourished,in no acute distress; alert,appropriate and cooperative throughout examination HEENT: No facial asymmetry,  EOMI, No sinus tenderness, TM's Clear, oropharynx  pink and moist. possible carotid bruits  Chest: Clear to auscultation bilaterally. No reproducible chest wall tenderness. CVS: S1, S2, No murmurs, No S3.   Abd: Soft,  mild epigastric tenderness. MS: Adequate ROM spine, hips, shoulders and knees. Tender over lower back and right sI joint Ext: No edema.   CNS: CN 2-12 intact, power tone and sensation normal throughout.   Skin: Intact, no visible lesions or rashes.  Psych: Good eye contact, normal affect.  Memory intact, not anxious or depressed appearing.     Impression & Recommendations:  Problem # 1:  HIP PAIN, RIGHT (ICD-719.45) Assessment Deteriorated  Her updated medication list for this problem includes:    Aleve 220 Mg Tabs (Naproxen sodium) .Marland Kitchen... As needed  Orders: Radiology other (Radiology Other)  Problem # 2:  DISORDER OF BONE AND CARTILAGE  UNSPECIFIED (ICD-733.90) Assessment: Comment Only  Orders: Radiology other (Radiology Other) T-Vitamin D (25-Hydroxy) [phone]), non compliant with med, ghigh risk of osteoperosis, dexa indicated  Problem # 3:  CHEST PAIN (ICD-786.50) Assessment: Comment Only has had neg card eval  Complete Medication List: 1)  Aleve 220 Mg Tabs (Naproxen sodium) .... As needed  Other Orders: Radiology Referral (Radiology)  Patient Instructions: 1)  Follow up appointment in 5.34months 2)  It is important that you exercise regularly at least 40 minutes 5 times a week. If you develop chest pain, have severe difficulty breathing, or feel very tired , stop exercising immediately and seek medical attention. 3)  You need to lose weight. Consider a lower calorie diet and regular exercise. Goal is 10 pounds in the next in 5.5 months. 4)  Vitamin D next 5.5 months

## 2010-08-14 NOTE — Progress Notes (Signed)
Summary: please advise  Phone Note Call from Patient   Summary of Call: patient states her back is starting to hurt and wanted to know if she could get some flexiril.  She thinks it is muscle more then anything, she states she had xray's done awhile back.  I advised her that Dr. Lodema Hong was out this week, but I would put in phone note. Initial call taken by: Curtis Sites,  July 16, 2010 4:14 PM  Follow-up for Phone Call        I entered historically for as needed use of cyclobenz, generic flexeril, pls let her know and fax to th pharmacy of her choice Follow-up by: Syliva Overman MD,  July 21, 2010 12:57 PM  Additional Follow-up for Phone Call Additional follow up Details #1::        Phone Call Completed, Rx Called In Additional Follow-up by: Adella Hare LPN,  July 21, 2010 2:01 PM    New/Updated Medications: CYCLOBENZAPRINE HCL 10 MG TABS (CYCLOBENZAPRINE HCL) Take 1 tab by mouth at bedtime as needed for back pain and spasm Prescriptions: CYCLOBENZAPRINE HCL 10 MG TABS (CYCLOBENZAPRINE HCL) Take 1 tab by mouth at bedtime as needed for back pain and spasm  #60 x 0   Entered by:   Adella Hare LPN   Authorized by:   Syliva Overman MD   Signed by:   Adella Hare LPN on 54/03/8118   Method used:   Electronically to        [facility] [address] [phone]* (retail)       [address] [place], [place]  [address]       Ph: [phone]       Fax: [phone]   RxID:   [id number] CYCLOBENZAPRINE HCL 10 MG TABS (CYCLOBENZAPRINE HCL) Take 1 tab by mouth at bedtime as needed for back pain and spasm  #60 x 0   Entered and Authorized by:   Syliva Overman MD   Signed by:   Syliva Overman MD on 07/21/2010   Method used:   Historical   RxID:   [id number]

## 2010-08-14 NOTE — Letter (Signed)
Summary: [facility]   Imported By: Lind Guest 07/02/2010 08:58:09  _____________________________________________________________________  External Attachment:    Type:   Image     Comment:   External Document

## 2010-08-15 NOTE — Letter (Signed)
Summary: Letter  Letter   Imported By: Lind Guest 12/13/2009 16:23:06  _____________________________________________________________________  External Attachment:    Type:   Image     Comment:   External Document

## 2010-08-15 NOTE — Letter (Signed)
Summary: DR. Dietrich Pates  DR. Dietrich Pates   Imported By: Lind Guest 12/11/2009 15:31:30  _____________________________________________________________________  External Attachment:    Type:   Image     Comment:   External Document

## 2010-11-07 ENCOUNTER — Encounter: Payer: Self-pay | Admitting: Family Medicine

## 2010-11-07 ENCOUNTER — Ambulatory Visit: Payer: Self-pay | Admitting: Family Medicine

## 2010-11-11 ENCOUNTER — Other Ambulatory Visit: Payer: Self-pay | Admitting: Family Medicine

## 2010-11-13 ENCOUNTER — Encounter: Payer: Self-pay | Admitting: Family Medicine

## 2010-11-14 ENCOUNTER — Ambulatory Visit (INDEPENDENT_AMBULATORY_CARE_PROVIDER_SITE_OTHER): Payer: Commercial Managed Care - PPO | Admitting: Family Medicine

## 2010-11-14 ENCOUNTER — Encounter: Payer: Self-pay | Admitting: Family Medicine

## 2010-11-14 VITALS — BP 110/72 | HR 68 | Resp 16 | Ht 63.0 in | Wt 124.8 lb

## 2010-11-14 DIAGNOSIS — M549 Dorsalgia, unspecified: Secondary | ICD-10-CM

## 2010-11-14 DIAGNOSIS — K5900 Constipation, unspecified: Secondary | ICD-10-CM

## 2010-11-14 DIAGNOSIS — R5383 Other fatigue: Secondary | ICD-10-CM

## 2010-11-14 DIAGNOSIS — I10 Essential (primary) hypertension: Secondary | ICD-10-CM

## 2010-11-14 DIAGNOSIS — M949 Disorder of cartilage, unspecified: Secondary | ICD-10-CM

## 2010-11-14 DIAGNOSIS — Z79899 Other long term (current) drug therapy: Secondary | ICD-10-CM

## 2010-11-14 DIAGNOSIS — R5381 Other malaise: Secondary | ICD-10-CM

## 2010-11-14 DIAGNOSIS — M899 Disorder of bone, unspecified: Secondary | ICD-10-CM

## 2010-11-14 DIAGNOSIS — E559 Vitamin D deficiency, unspecified: Secondary | ICD-10-CM

## 2010-11-14 NOTE — Progress Notes (Signed)
  Subjective:    Patient ID: Holly Murphy, female    DOB: 1960/10/06, 50 y.o.   MRN: [id number]  HPI The PT is here for follow up and re-evaluation of chronic medical conditions, medication management and review of any  recent lab and radiology data.  Preventive health is updated, specifically  Cancer screening, Osteoporosis screening and Immunization.   There are no new concerns. She does however continue experience chronic back pain for which she sees a chiropracter from time to time.She does have scoliosis and some arthritis, by xray, in her spine. She also does  admit to poor posture.She uses tylenol on an as needed basis for relief. She has not ben taking vitamin D supplement , and this remains low  Pt has lost approx 20 pounds in the past 4 months by following a 1200 calorie diet, she exercises on average twice weekly, but intends to increase this.  She does have a concern about a recent change in bowel habit , over the past 3 months she is experiencing constipation, which is new. Has started daily senokot, and on one occasion even had to purchase a fleets enema.Her diet is actually better, but has now started having BM's on average every 3 to 4 days. No change in caliber has been noted.There is no known family h/o colon cancer.      Review of Systems Denies recent fever or chills. Denies sinus pressure, nasal congestion, ear pain or sore throat. Denies chest congestion, productive cough or wheezing. Denies chest pains, palpitations, paroxysmal nocturnal dyspnea, orthopnea and leg swelling Denies abdominal pain, nausea, vomiting,diarrhea   Denies rectal bleeding or change in bowel movement. Denies dysuria, frequency, hesitancy or incontinence. Denies headaches, seizure, numbness, or tingling. Denies depression, anxiety or insomnia. Denies skin break down or rash.        Objective:   Physical Exam Pleasant female, alert and oriented and in no Cardiopulmonary  distress.  HEENT: No facial asymmetry, EOMI, no sinus tenderness, TM's clear, Oropharynx pink and moist.  Neck supple no adenopathy.  Chest: Clear to auscultation bilaterally.  CVS: S1, S2 no murmurs, no S3.  ABD: Soft non tender. Bowel sounds normal.  Ext: No edema  MS: Adequate ROM spine, shoulders, hips and knees.  Skin: Intact, no ulcerations or rash noted.  Psych: Good eye contact, normal affect. Memory intact not anxious or depressed appearing.  CNS: CN 2-12 intact, power, tone and sensation normal throughout.        Assessment & Plan:

## 2010-11-14 NOTE — Patient Instructions (Signed)
F/u in 6 months.  Fastingchem 7,CBC and diff,, TSH early june   Congrats on weight loss, keep it up.  Pls commit to exercise at  150 minutes per week  Start vit D 800iU once daily, pls  Fasting lipid and vit d in 6 months  You are being referred to Dr Renae Fickle re constipation whcih is new

## 2010-11-15 ENCOUNTER — Encounter: Payer: Self-pay | Admitting: Family Medicine

## 2010-11-15 DIAGNOSIS — E559 Vitamin D deficiency, unspecified: Secondary | ICD-10-CM | POA: Insufficient documentation

## 2010-11-15 DIAGNOSIS — K5900 Constipation, unspecified: Secondary | ICD-10-CM | POA: Insufficient documentation

## 2010-11-15 NOTE — Assessment & Plan Note (Signed)
Unchanged. Counseled pt to take in 800IU vit D daily per standard recommendations. On review dexa 1 year ago was normal

## 2010-11-15 NOTE — Assessment & Plan Note (Signed)
New constipation in the past 3 months, despite increased fiber in diet and healthier eating. No chane in caliber noted, will refer to GI for further evaluation

## 2010-11-17 ENCOUNTER — Encounter: Payer: Self-pay | Admitting: Family Medicine

## 2010-12-24 ENCOUNTER — Ambulatory Visit (INDEPENDENT_AMBULATORY_CARE_PROVIDER_SITE_OTHER): Payer: Commercial Managed Care - PPO | Admitting: Internal Medicine

## 2010-12-25 LAB — TSH: TSH: 1.043 u[IU]/mL (ref 0.350–4.500)

## 2010-12-25 LAB — BASIC METABOLIC PANEL
BUN: 13 mg/dL (ref 6–23)
CO2: 25 mEq/L (ref 19–32)
Glucose, Bld: 87 mg/dL (ref 70–99)
Potassium: 4 mEq/L (ref 3.5–5.3)
Sodium: 141 mEq/L (ref 135–145)

## 2010-12-25 LAB — CBC WITH DIFFERENTIAL/PLATELET
Eosinophils Absolute: 0.1 10*3/uL (ref 0.0–0.7)
MCHC: 32.9 g/dL (ref 30.0–36.0)
Monocytes Relative: 9 % (ref 3–12)
Neutro Abs: 1.7 10*3/uL (ref 1.7–7.7)
Platelets: 266 10*3/uL (ref 150–400)
WBC: 3.9 10*3/uL — ABNORMAL LOW (ref 4.0–10.5)

## 2011-04-20 ENCOUNTER — Other Ambulatory Visit: Payer: Self-pay | Admitting: Obstetrics and Gynecology

## 2011-04-20 DIAGNOSIS — Z139 Encounter for screening, unspecified: Secondary | ICD-10-CM

## 2011-05-18 ENCOUNTER — Telehealth: Payer: Self-pay | Admitting: Family Medicine

## 2011-05-18 ENCOUNTER — Other Ambulatory Visit: Payer: Self-pay | Admitting: Family Medicine

## 2011-05-19 LAB — LIPID PANEL
Cholesterol: 180 mg/dL (ref 0–200)
HDL: 73 mg/dL (ref >39)
Total CHOL/HDL Ratio: 2.5 Ratio

## 2011-05-19 LAB — VITAMIN D 25 HYDROXY (VIT D DEFICIENCY, FRACTURES): Vit D, 25-Hydroxy: 29 ng/mL — ABNORMAL LOW (ref 30–89)

## 2011-05-20 NOTE — Telephone Encounter (Signed)
Already have received patients results

## 2011-05-21 ENCOUNTER — Encounter: Payer: Self-pay | Admitting: Family Medicine

## 2011-05-22 ENCOUNTER — Encounter: Payer: Self-pay | Admitting: Family Medicine

## 2011-05-22 ENCOUNTER — Ambulatory Visit (INDEPENDENT_AMBULATORY_CARE_PROVIDER_SITE_OTHER): Payer: 59 | Admitting: Family Medicine

## 2011-05-22 VITALS — BP 100/60 | HR 72 | Resp 16 | Ht 63.0 in | Wt 116.1 lb

## 2011-05-22 DIAGNOSIS — Z13220 Encounter for screening for lipoid disorders: Secondary | ICD-10-CM

## 2011-05-22 DIAGNOSIS — R5381 Other malaise: Secondary | ICD-10-CM

## 2011-05-22 DIAGNOSIS — Z0000 Encounter for general adult medical examination without abnormal findings: Secondary | ICD-10-CM

## 2011-05-22 DIAGNOSIS — E559 Vitamin D deficiency, unspecified: Secondary | ICD-10-CM

## 2011-05-22 DIAGNOSIS — K5900 Constipation, unspecified: Secondary | ICD-10-CM

## 2011-05-22 NOTE — Progress Notes (Signed)
  Subjective:    Patient ID: Holly Murphy, female    DOB: 1961/05/08, 50 y.o.   MRN: [id number]  HPI The PT is here for follow up and re-evaluation of chronic medical conditions, medication management and review of any available recent lab and radiology data.  Preventive health is updated, specifically  Cancer screening and Immunization.   Questions or concerns regarding consultations or procedures which the PT has had in the interim are  addressed. The PT denies any adverse reactions to current medications since the last visit.  Pt lost her ailing mother in the early Summer/late Spring. She has been having some difficulty dealing with this as she had to determine that life support be discontinued, she has considered therapy, but has not followed through, and states that over time she is improving mentally and emotinally. She has been extremely successful in the past year in weight loss and establishing healthy eating and exercise habits. She sees a chiropracter once per month for manipulation for chronic back pain which she finds helpful      Review of Systems See HPI Denies recent fever or chills. Denies sinus pressure, nasal congestion, ear pain or sore throat. Denies chest congestion, productive cough or wheezing. Denies chest pains, palpitations and leg swelling Denies abdominal pain, nausea, vomiting,diarrhea or constipation.   Denies dysuria, frequency, hesitancy or incontinence. Denies joint pain, swelling and limitation in mobility. Denies headaches, seizures, numbness, or tingling. Denies depression, anxiety or insomnia. Denies skin break down or rash.        Objective:   Physical Exam Patient alert and oriented and in no cardiopulmonary distress.  HEENT: No facial asymmetry, EOMI, no sinus tenderness,  oropharynx pink and moist.  Neck supple no adenopathy.  Chest: Clear to auscultation bilaterally.  CVS: S1, S2 no murmurs, no S3.  ABD: Soft non tender. Bowel  sounds normal.  Ext: No edema  MS: Adequate ROM spine, shoulders, hips and knees.  Skin: Intact, no ulcerations or rash noted.  Psych: Good eye contact, normal affect. Memory intact not anxious or depressed appearing.  CNS: CN 2-12 intact, power, tone and sensation normal throughout.        Assessment & Plan:

## 2011-05-22 NOTE — Patient Instructions (Signed)
F/U in 1 year.  Fasting labs in 1 year.  Call if you need me before  pls check on tetanus status   It is important that you exercise regularly at least 30 minutes 5 times a week. If you develop chest pain, have severe difficulty breathing, or feel very tired, stop exercising immediately and seek medical attention    A healthy diet is rich in fruit, vegetables and whole grains. Poultry fish, nuts and beans are a healthy choice for protein rather then red meat. A low sodium diet and drinking 64 ounces of water daily is generally recommended. Oils and sweet should be limited. Carbohydrates especially for those who are diabetic or overweight, should be limited to 34-45 gram per meal. It is important to eat on a regular schedule, at least 3 times daily. Snacks should be primarily fruits, vegetables or nuts.    Congrats on healthy lifestyle  Please call for

## 2011-05-23 NOTE — Assessment & Plan Note (Signed)
Improving slightly, not consistent with supplement

## 2011-05-23 NOTE — Assessment & Plan Note (Signed)
Resolved , pt did not follow through on gI evaluation at the time appt came up, she felt as though her symptoms had completely resolved.will continue observation only, and plans on a colonoscopy at age 50

## 2011-06-01 ENCOUNTER — Ambulatory Visit (HOSPITAL_COMMUNITY)
Admission: RE | Admit: 2011-06-01 | Discharge: 2011-06-01 | Disposition: A | Discharge status: Home / Self Care | Payer: 59 | Source: Ambulatory Visit | Attending: Obstetrics and Gynecology | Admitting: Obstetrics and Gynecology

## 2011-06-01 DIAGNOSIS — Z139 Encounter for screening, unspecified: Secondary | ICD-10-CM

## 2011-06-01 DIAGNOSIS — Z1231 Encounter for screening mammogram for malignant neoplasm of breast: Secondary | ICD-10-CM | POA: Insufficient documentation

## 2011-06-01 IMAGING — MG MM DIGITAL SCREENING BILAT W/ CAD
4 series · 4 of 4 positions shown · non-contrast
Comparison: none

DG SCREEN MAMMOGRAM BILATERAL
Bilateral CC and MLO view(s) were taken.

DIGITAL SCREENING MAMMOGRAM WITH CAD:
The breast tissue is heterogeneously dense.  No masses or malignant type calcifications are 
identified.  Compared with prior studies.
Images were processed with CAD.

[L CC]
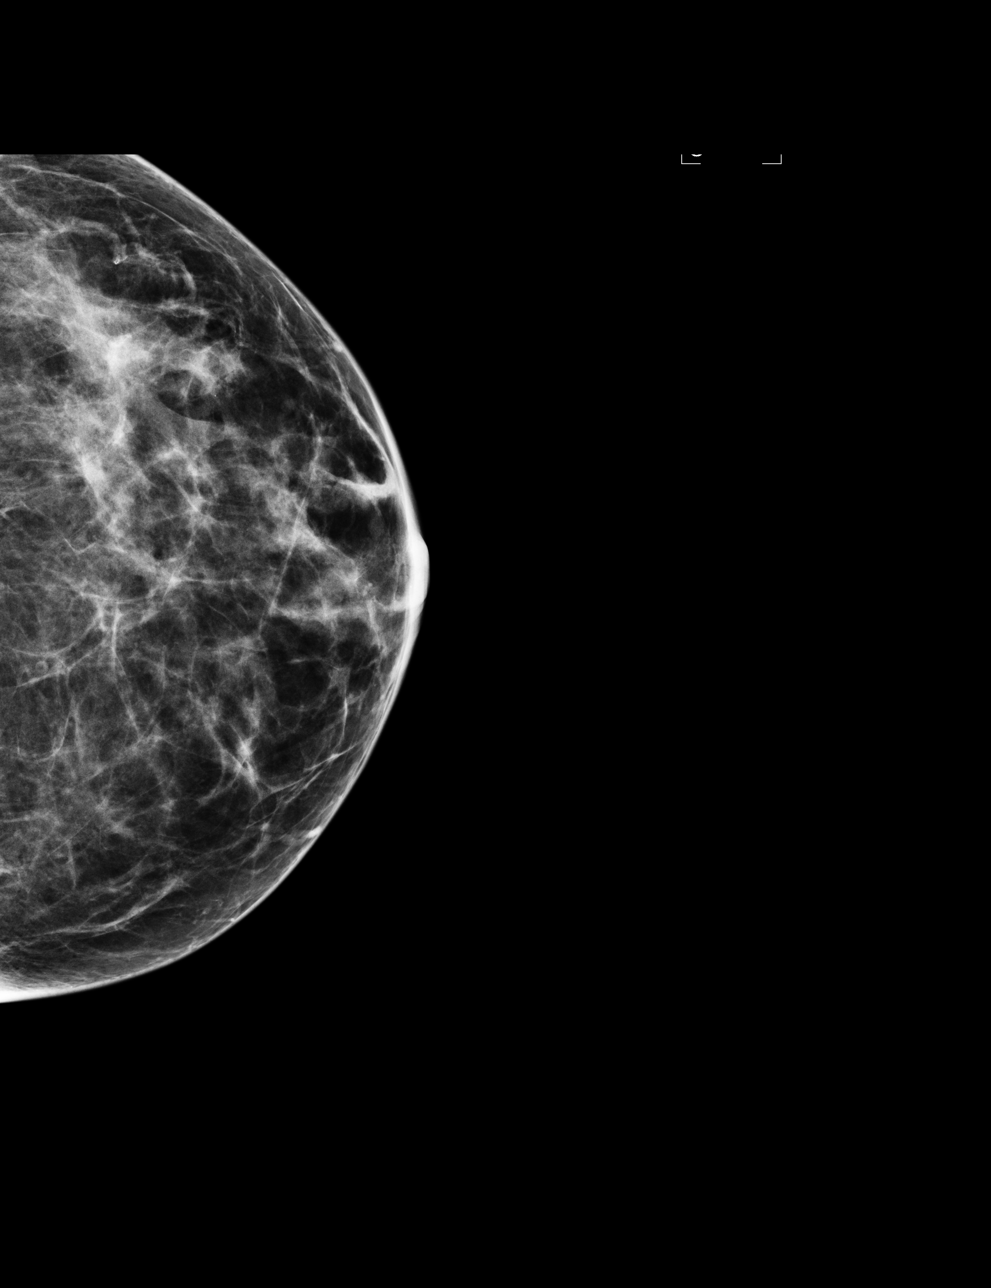

[L MLO]
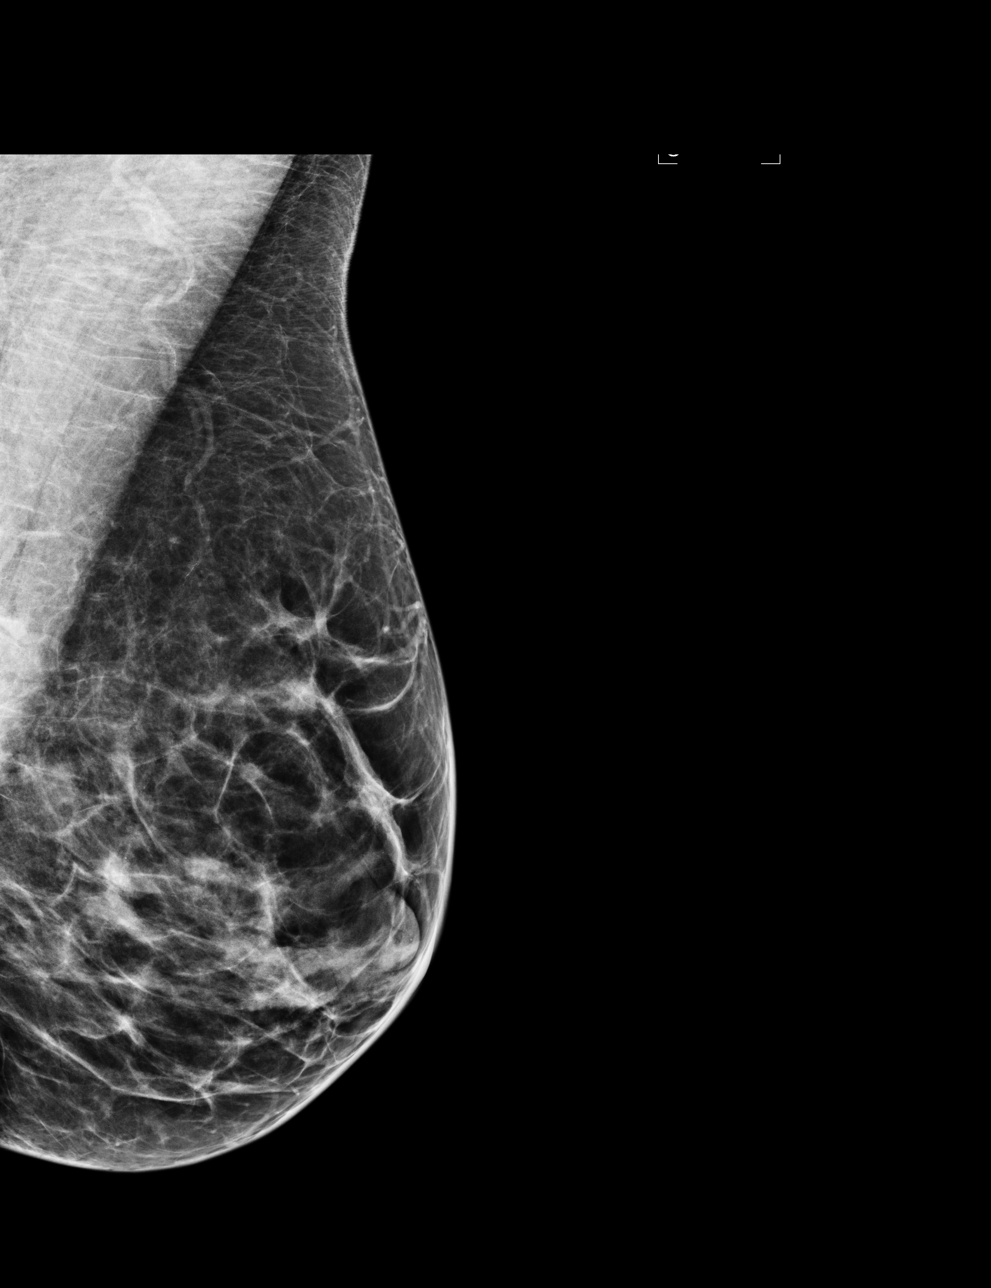

[R CC]
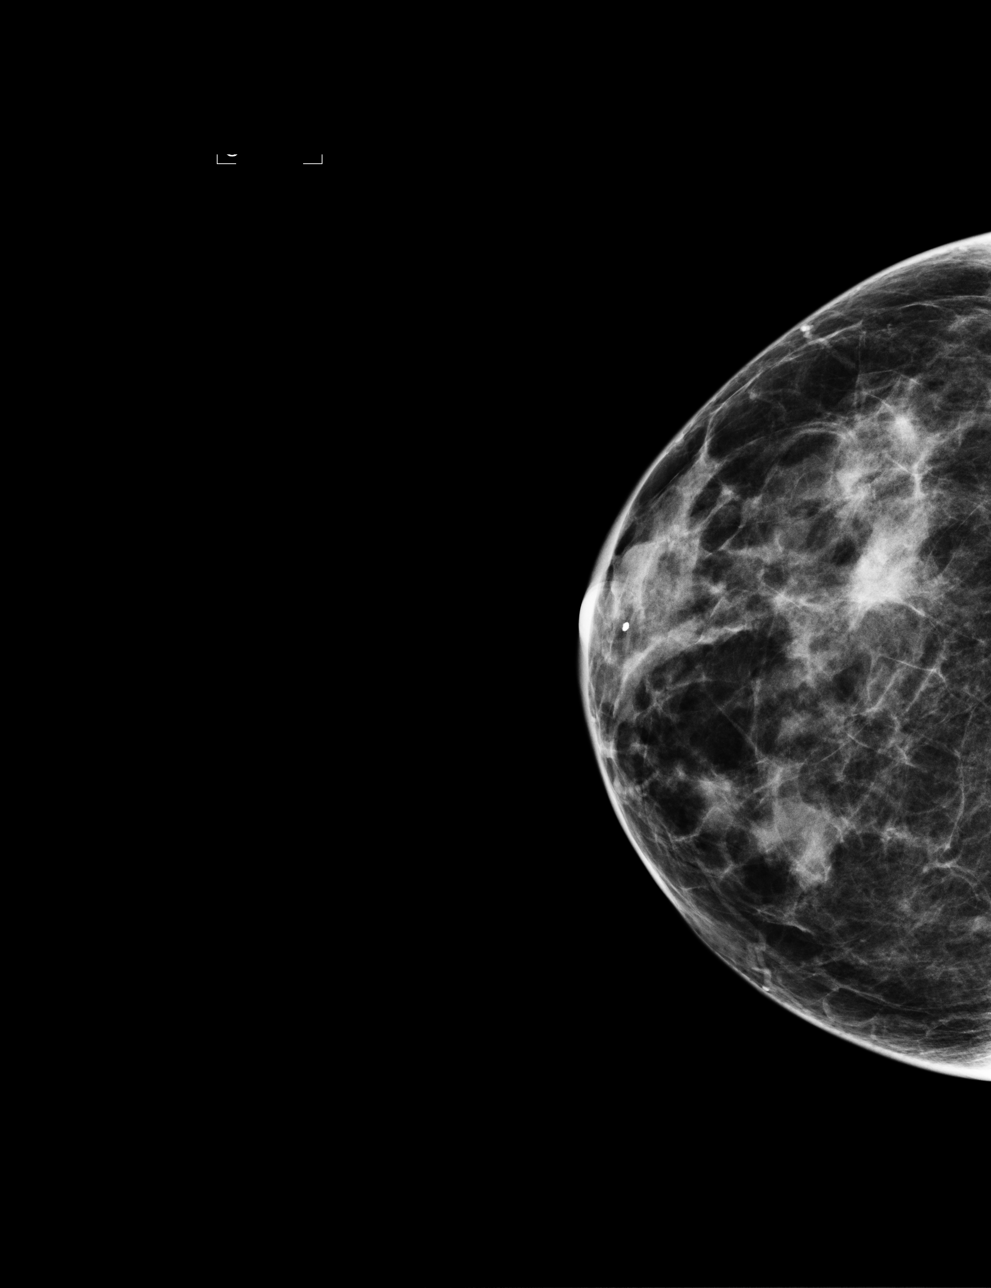

[R MLO]
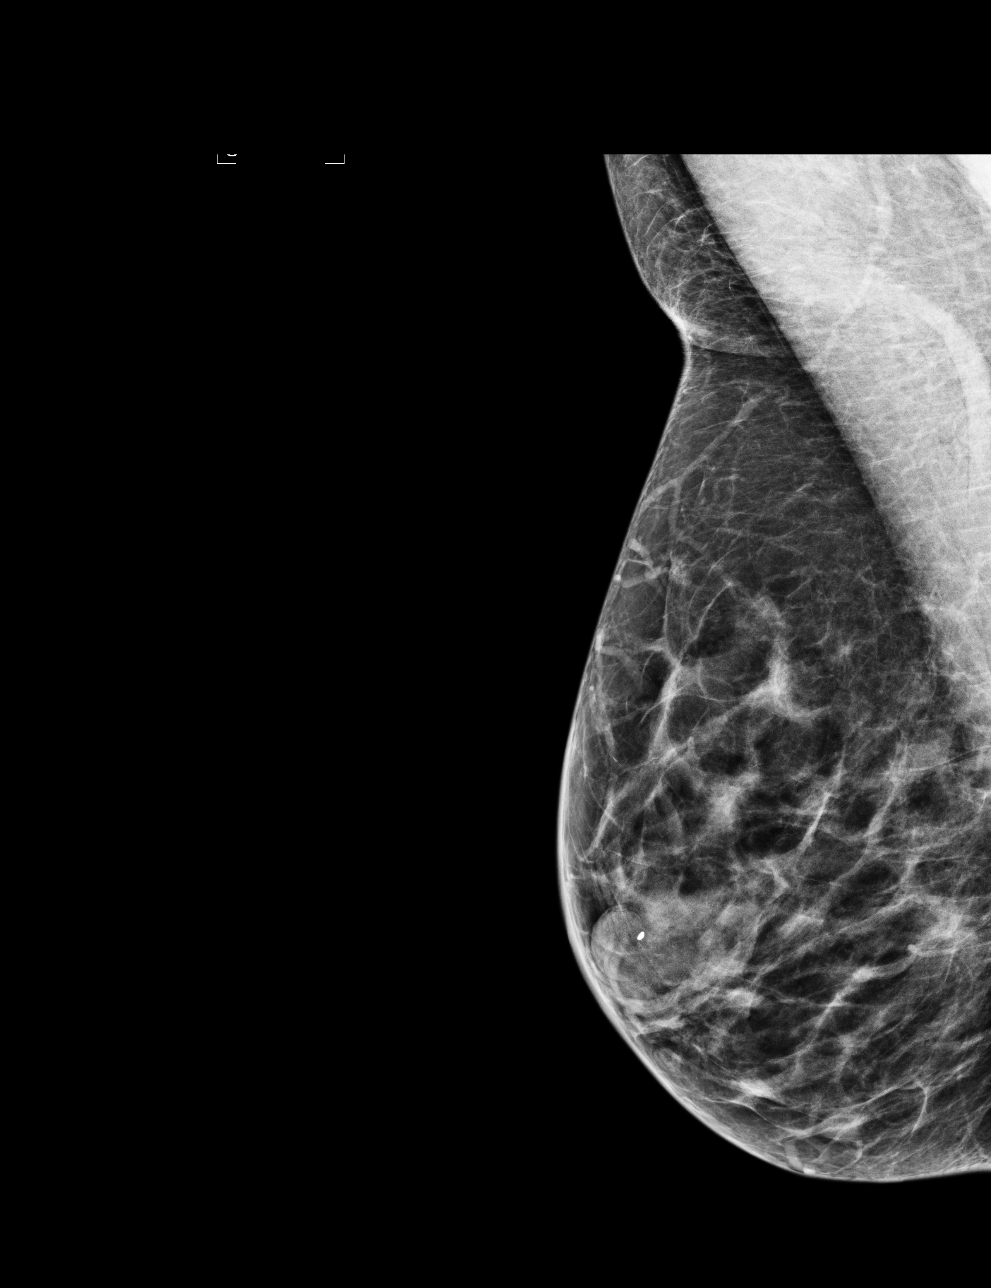

[4 of 4 positions shown; findings below may reference images not displayed]

IMPRESSION: No specific mammographic evidence of malignancy.  Next screening mammogram is recommended in one 
year.

A result letter of this screening mammogram will be mailed directly to the patient.

ASSESSMENT: Negative - BI-RADS 1

Screening mammogram in 1 year.
,

## 2011-07-03 ENCOUNTER — Other Ambulatory Visit: Payer: Self-pay | Admitting: Adult Health

## 2011-07-15 ENCOUNTER — Other Ambulatory Visit (HOSPITAL_COMMUNITY)
Admission: RE | Admit: 2011-07-15 | Discharge: 2011-07-15 | Disposition: A | Discharge status: Home / Self Care | Payer: 59 | Source: Ambulatory Visit | Attending: Obstetrics and Gynecology | Admitting: Obstetrics and Gynecology

## 2011-07-15 ENCOUNTER — Other Ambulatory Visit: Payer: Self-pay | Admitting: Adult Health

## 2011-07-15 DIAGNOSIS — Z01419 Encounter for gynecological examination (general) (routine) without abnormal findings: Secondary | ICD-10-CM | POA: Insufficient documentation

## 2012-06-07 ENCOUNTER — Other Ambulatory Visit: Payer: Self-pay | Admitting: Family Medicine

## 2012-06-07 DIAGNOSIS — Z139 Encounter for screening, unspecified: Secondary | ICD-10-CM

## 2012-06-21 ENCOUNTER — Ambulatory Visit (HOSPITAL_COMMUNITY)
Admission: RE | Admit: 2012-06-21 | Discharge: 2012-06-21 | Disposition: A | Discharge status: Home / Self Care | Payer: 59 | Source: Ambulatory Visit | Attending: Family Medicine | Admitting: Family Medicine

## 2012-06-21 DIAGNOSIS — Z1231 Encounter for screening mammogram for malignant neoplasm of breast: Secondary | ICD-10-CM | POA: Insufficient documentation

## 2012-06-21 DIAGNOSIS — Z139 Encounter for screening, unspecified: Secondary | ICD-10-CM

## 2012-06-21 IMAGING — MG MM DIGITAL SCREENING BILAT
4 series · 4 of 4 positions shown · non-contrast
Comparison: Previous exams.

CLINICAL DATA: Screening.

DIGITAL BILATERAL SCREENING MAMMOGRAM WITH CAD

[L CC]
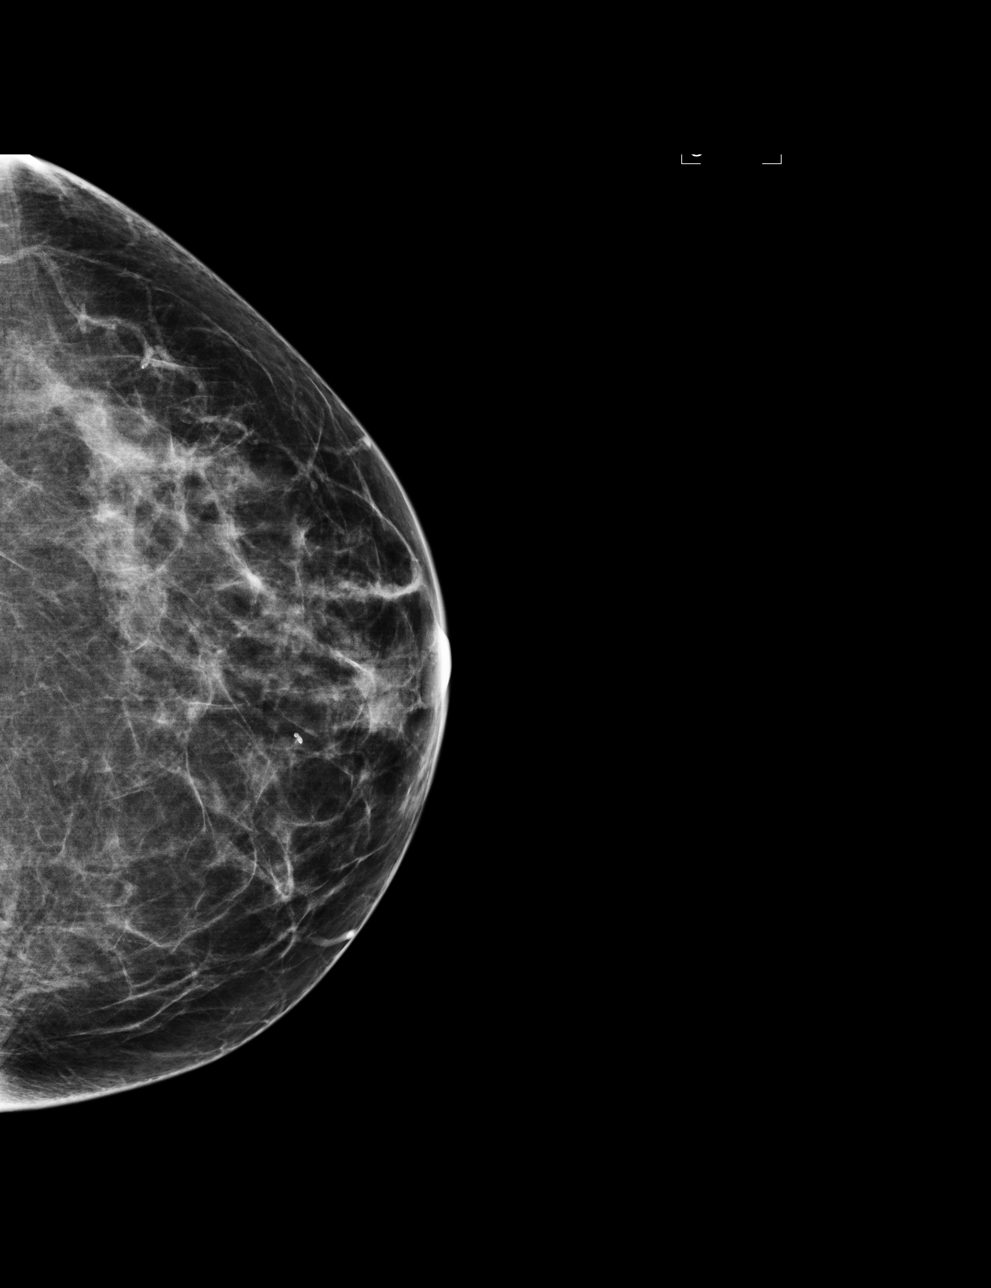

[L MLO]
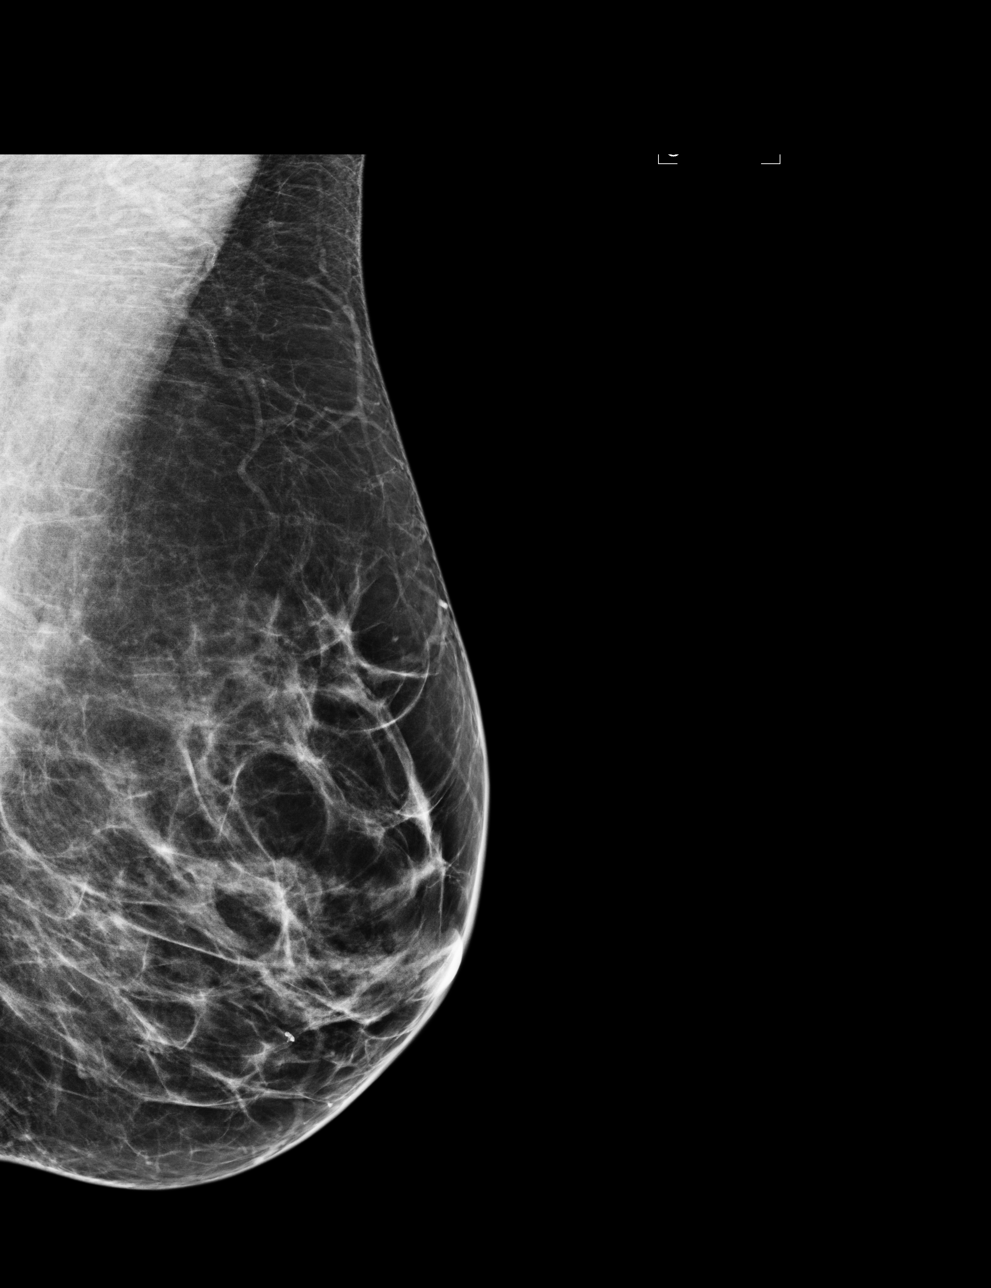

[R CC]
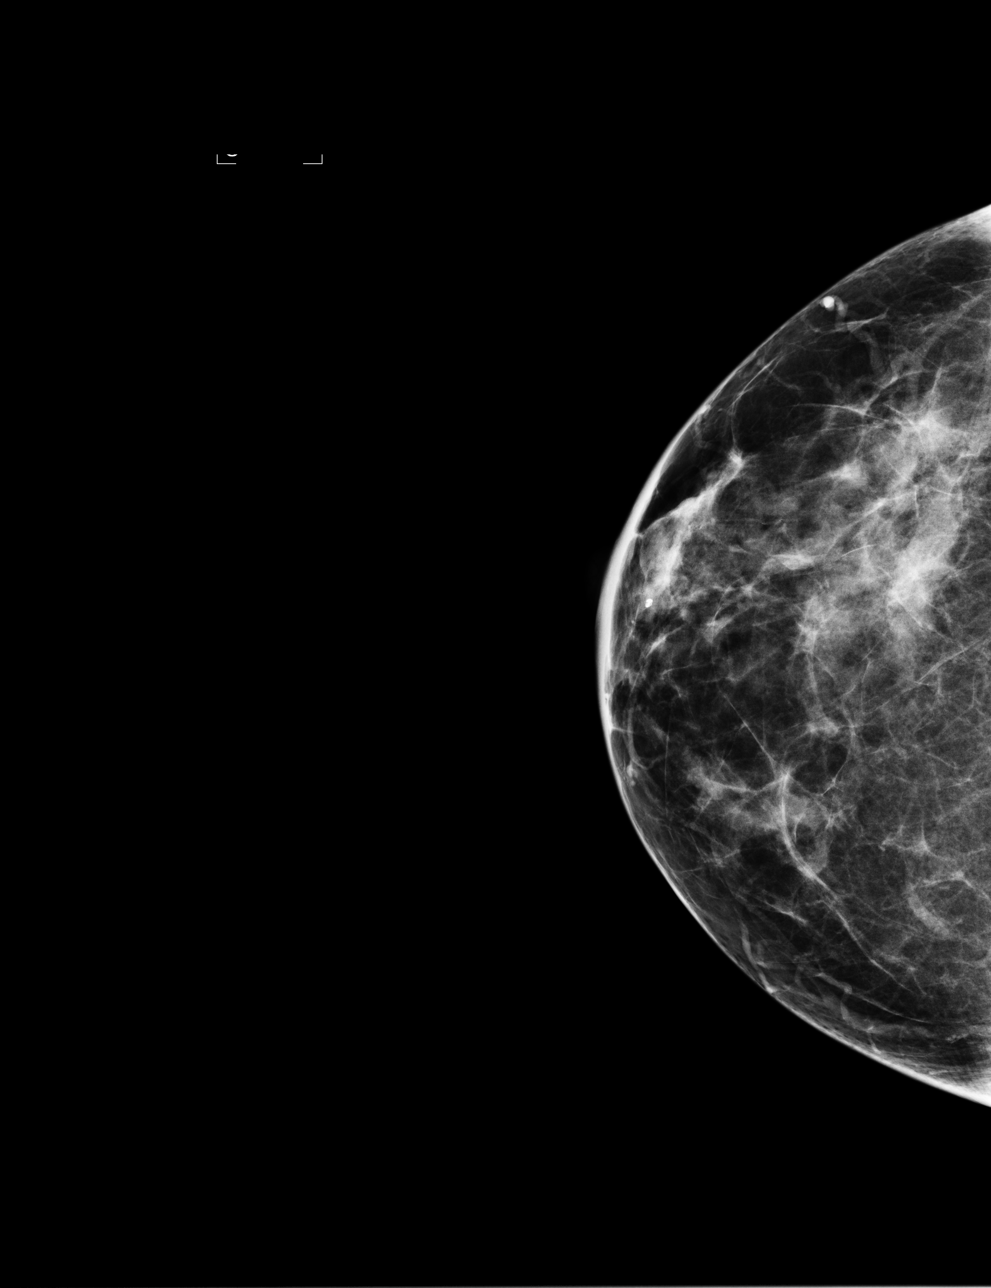

[R MLO]
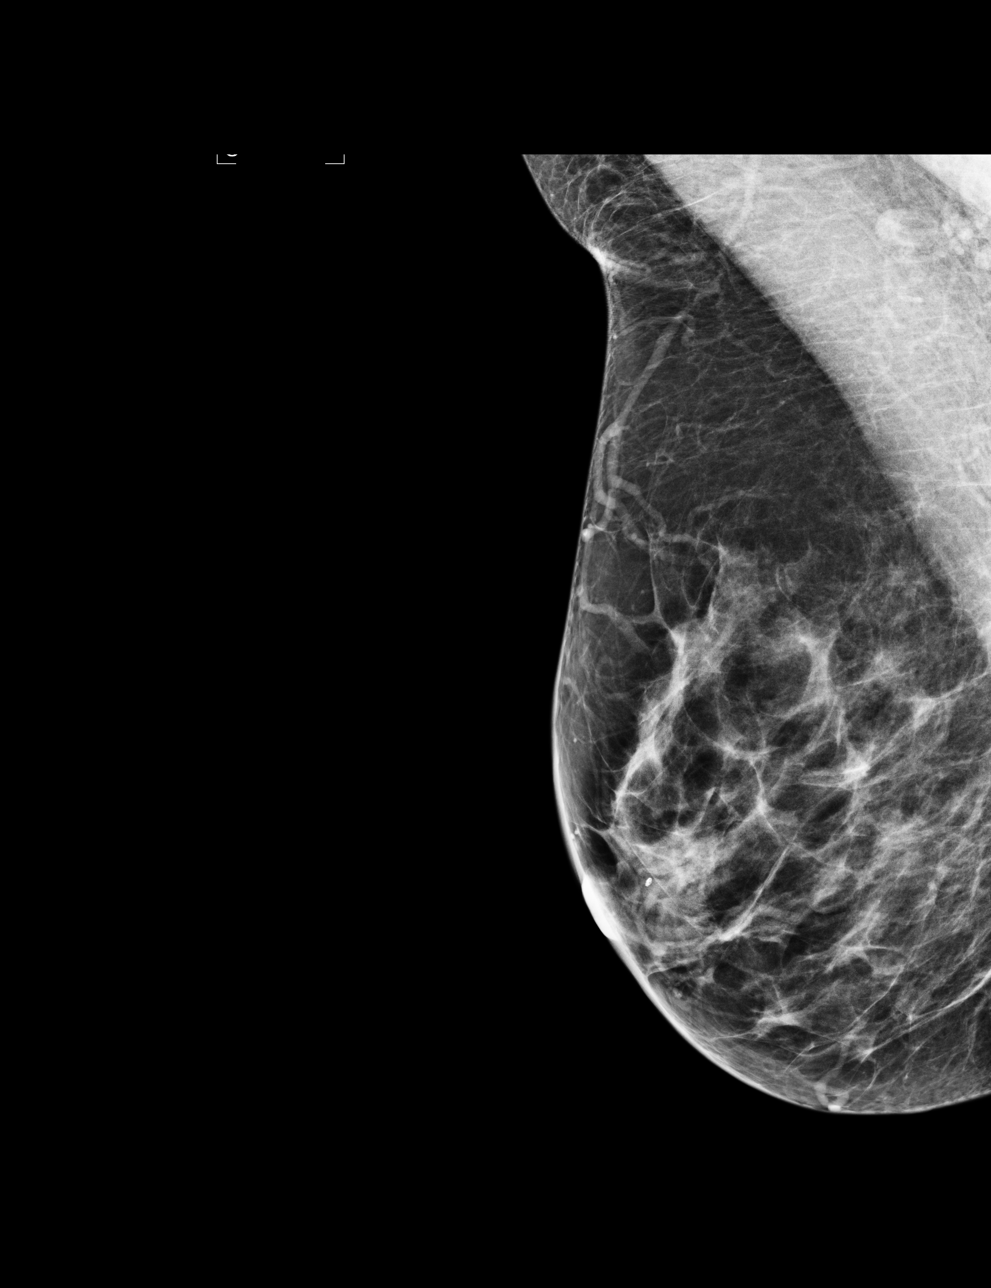

[4 of 4 positions shown; findings below may reference images not displayed]

FINDINGS: ACR Breast Density Category 3: The breast tissue is heterogeneously
dense.

No suspicious masses, architectural distortion, or calcifications
are present.

Images were processed with CAD.
IMPRESSION: No mammographic evidence of malignancy.

A result letter of this screening mammogram will be mailed directly
to the patient.

RECOMMENDATION:
Screening mammogram in one year. (Code:[BP])

BI-RADS CATEGORY 1:  Negative.

## 2012-07-15 ENCOUNTER — Other Ambulatory Visit (HOSPITAL_COMMUNITY)
Admission: RE | Admit: 2012-07-15 | Discharge: 2012-07-15 | Disposition: A | Discharge status: Home / Self Care | Payer: 59 | Source: Ambulatory Visit | Attending: Obstetrics and Gynecology | Admitting: Obstetrics and Gynecology

## 2012-07-15 ENCOUNTER — Other Ambulatory Visit: Payer: Self-pay | Admitting: Adult Health

## 2012-07-15 DIAGNOSIS — Z1151 Encounter for screening for human papillomavirus (HPV): Secondary | ICD-10-CM | POA: Insufficient documentation

## 2012-07-15 DIAGNOSIS — Z01419 Encounter for gynecological examination (general) (routine) without abnormal findings: Secondary | ICD-10-CM | POA: Insufficient documentation

## 2013-01-18 ENCOUNTER — Telehealth: Payer: Self-pay | Admitting: Family Medicine

## 2013-01-18 DIAGNOSIS — E559 Vitamin D deficiency, unspecified: Secondary | ICD-10-CM

## 2013-01-18 DIAGNOSIS — Z139 Encounter for screening, unspecified: Secondary | ICD-10-CM

## 2013-01-18 DIAGNOSIS — Z79899 Other long term (current) drug therapy: Secondary | ICD-10-CM

## 2013-01-18 DIAGNOSIS — R5381 Other malaise: Secondary | ICD-10-CM

## 2013-01-18 NOTE — Telephone Encounter (Signed)
What labs does she need? 

## 2013-01-19 NOTE — Telephone Encounter (Signed)
Lab order faxed.

## 2013-01-19 NOTE — Telephone Encounter (Signed)
pls send over cbc, fasting lipid, chem 7, TSH and vit D, thanks

## 2013-02-01 LAB — CBC WITH DIFFERENTIAL/PLATELET
Basophils Absolute: 0 10*3/uL (ref 0.0–0.1)
Lymphocytes Relative: 48 % — ABNORMAL HIGH (ref 12–46)
Lymphs Abs: 1.6 10*3/uL (ref 0.7–4.0)
Neutrophils Relative %: 40 % — ABNORMAL LOW (ref 43–77)
Platelets: 267 10*3/uL (ref 150–400)
RBC: 4.13 MIL/uL (ref 3.87–5.11)
WBC: 3.4 10*3/uL — ABNORMAL LOW (ref 4.0–10.5)

## 2013-02-01 LAB — LIPID PANEL
Cholesterol: 181 mg/dL (ref 0–200)
HDL: 67 mg/dL (ref >39)
Total CHOL/HDL Ratio: 2.7 Ratio

## 2013-02-01 LAB — BASIC METABOLIC PANEL
BUN: 12 mg/dL (ref 6–23)
CO2: 27 mEq/L (ref 19–32)
Calcium: 9.6 mg/dL (ref 8.4–10.5)
Glucose, Bld: 87 mg/dL (ref 70–99)
Potassium: 4.2 mEq/L (ref 3.5–5.3)

## 2013-02-02 LAB — TSH: TSH: 1.413 u[IU]/mL (ref 0.350–4.500)

## 2013-02-23 ENCOUNTER — Ambulatory Visit (INDEPENDENT_AMBULATORY_CARE_PROVIDER_SITE_OTHER): Payer: 59 | Admitting: Family Medicine

## 2013-02-23 ENCOUNTER — Encounter: Payer: Self-pay | Admitting: Family Medicine

## 2013-02-23 VITALS — BP 102/68 | HR 72 | Resp 16 | Ht 63.0 in | Wt 124.4 lb

## 2013-02-23 DIAGNOSIS — N63 Unspecified lump in unspecified breast: Secondary | ICD-10-CM

## 2013-02-23 DIAGNOSIS — D72819 Decreased white blood cell count, unspecified: Secondary | ICD-10-CM

## 2013-02-23 DIAGNOSIS — Z1211 Encounter for screening for malignant neoplasm of colon: Secondary | ICD-10-CM

## 2013-02-23 DIAGNOSIS — E559 Vitamin D deficiency, unspecified: Secondary | ICD-10-CM

## 2013-02-23 DIAGNOSIS — Z139 Encounter for screening, unspecified: Secondary | ICD-10-CM

## 2013-02-23 DIAGNOSIS — N6320 Unspecified lump in the left breast, unspecified quadrant: Secondary | ICD-10-CM

## 2013-02-23 DIAGNOSIS — M549 Dorsalgia, unspecified: Secondary | ICD-10-CM

## 2013-02-23 MED ORDER — ERGOCALCIFEROL 1.25 MG (50000 UT) PO CAPS: 50000.0000 [IU] | ORAL_CAPSULE | ORAL | Status: DC

## 2013-02-23 NOTE — Progress Notes (Signed)
  Subjective:    Patient ID: Holly Murphy, female    DOB: 01-16-1961, 52 y.o.   MRN: [id number]  HPI Pt in for annual follow up annd lab review. Concern today re possible left breast mass at 6 o clock which she felt 2 weeks ago and not since then Continue to keep active and has maintained a healthy weight, though she has had some weight gain Ongoing low back pain disturbs her sleep, change in mattress did not help, neither did chiropracter or imaging. No real interest at this time for xrays or orthopedic eval Pin is non radiating localized to SI joint area and she has no lower extremity symptoms or incontinence of bowel or bladder Needs colonoscopy, 52 !   Review of Systems See HPI Denies recent fever or chills. Denies sinus pressure, nasal congestion, ear pain or sore throat. Denies chest congestion, productive cough or wheezing. Denies chest pains, palpitations and leg swelling Denies abdominal pain, nausea, vomiting,diarrhea or constipation.   Denies dysuria, frequency, hesitancy or incontinence.  Denies headaches, seizures, numbness, or tingling. Denies depression, anxiety or insomnia. Denies skin break down or rash.        Objective:   Physical Exam  Patient alert and oriented and in no cardiopulmonary distress.  HEENT: No facial asymmetry, EOMI, no sinus tenderness,  oropharynx pink and moist.  Neck supple no adenopathy.  Chest: Clear to auscultation bilaterally. Breast: possible nodularity at 6 oclock on left breast , no nipple d/c or inversion , no axillary or supra clavicular nodes CVS: S1, S2 no murmurs, no S3.  ABD: Soft non tender. Bowel sounds normal.  Ext: No edema  MS: Adequate ROM spine, shoulders, hips and knees.Tender over SI joint area  Skin: Intact, no ulcerations or rash noted.  Psych: Good eye contact, normal affect. Memory intact not anxious or depressed appearing.  CNS: CN 2-12 intact, power, tone and sensation normal throughout.        Assessment & Plan:

## 2013-02-23 NOTE — Patient Instructions (Addendum)
F/u in 6 month, call if you need me before  New for vit D is once weekly capsule for 6 month total  New for bone health, age 51 is calcium with vit D daily, I will also send thi to your pharmacy  You are referred for a diagnostic mammogram of left breast  You are referred for hematologist to review your blood work  Please seriously consider an orthopedic asessment of the localized hip pain that disturbs your sleep, I believe that you can get a lot of help for this that will improve your quality of life, without too much intervention!   Please cut back on fried and fatty foods, bad cholesterol is SLIGHTLY elevated  Fasting lipid, and vit D in 6 month, just before  Next visit  You are referred to Dr Karilyn Cota for screening colonoscopy, happy 52!!!

## 2013-02-27 ENCOUNTER — Encounter (INDEPENDENT_AMBULATORY_CARE_PROVIDER_SITE_OTHER): Payer: Self-pay | Admitting: *Deleted

## 2013-03-01 ENCOUNTER — Encounter (HOSPITAL_COMMUNITY): Payer: Self-pay

## 2013-03-01 ENCOUNTER — Telehealth: Payer: Self-pay | Admitting: Adult Health

## 2013-03-01 ENCOUNTER — Other Ambulatory Visit: Payer: Self-pay | Admitting: Family Medicine

## 2013-03-01 ENCOUNTER — Encounter (HOSPITAL_COMMUNITY): Payer: 59 | Attending: Hematology and Oncology

## 2013-03-01 ENCOUNTER — Ambulatory Visit (HOSPITAL_COMMUNITY)
Admission: RE | Admit: 2013-03-01 | Discharge: 2013-03-01 | Disposition: A | Discharge status: Home / Self Care | Payer: 59 | Source: Ambulatory Visit | Attending: Family Medicine | Admitting: Family Medicine

## 2013-03-01 ENCOUNTER — Encounter (HOSPITAL_COMMUNITY): Payer: 59

## 2013-03-01 VITALS — BP 124/77 | HR 77 | Temp 98.8°F | Resp 14 | Ht 62.0 in | Wt 125.0 lb

## 2013-03-01 DIAGNOSIS — D72819 Decreased white blood cell count, unspecified: Secondary | ICD-10-CM | POA: Insufficient documentation

## 2013-03-01 DIAGNOSIS — N6320 Unspecified lump in the left breast, unspecified quadrant: Secondary | ICD-10-CM

## 2013-03-01 DIAGNOSIS — Z1231 Encounter for screening mammogram for malignant neoplasm of breast: Secondary | ICD-10-CM | POA: Insufficient documentation

## 2013-03-01 DIAGNOSIS — D702 Other drug-induced agranulocytosis: Secondary | ICD-10-CM

## 2013-03-01 LAB — COMPREHENSIVE METABOLIC PANEL
AST: 16 U/L (ref 0–37)
CO2: 29 mEq/L (ref 19–32)
Calcium: 9.6 mg/dL (ref 8.4–10.5)
Creatinine, Ser: 0.59 mg/dL (ref 0.50–1.10)
GFR calc Af Amer: >90 mL/min (ref >90)
GFR calc non Af Amer: >90 mL/min (ref >90)
Glucose, Bld: 132 mg/dL — ABNORMAL HIGH (ref 70–99)
Total Protein: 7 g/dL (ref 6.0–8.3)

## 2013-03-01 LAB — CBC WITH DIFFERENTIAL/PLATELET
Hemoglobin: 13.2 g/dL (ref 12.0–15.0)
Lymphs Abs: 1.6 10*3/uL (ref 0.7–4.0)
Monocytes Relative: 8 % (ref 3–12)
Neutro Abs: 2.6 10*3/uL (ref 1.7–7.7)
Neutrophils Relative %: 57 % (ref 43–77)
RBC: 4.12 MIL/uL (ref 3.87–5.11)

## 2013-03-01 LAB — LACTATE DEHYDROGENASE: LDH: 171 U/L (ref 94–250)

## 2013-03-01 IMAGING — US US BREAST*L*
1 series · 2 of 2 positions shown · non-contrast
Comparison: [DATE], [DATE], [DATE], [DATE]

CLINICAL DATA: The patient thinks she feels MATAHARI to marble sized
mass at 6 o'clock in the left inframammary fold intermittently.

DIGITAL DIAGNOSTIC LEFT MAMMOGRAM WITH CAD AND LEFT BREAST
ULTRASOUND:

[Series 1: us breast*left* · 0.08mm/px · 2 of 2 slices shown]
[im 1/2]
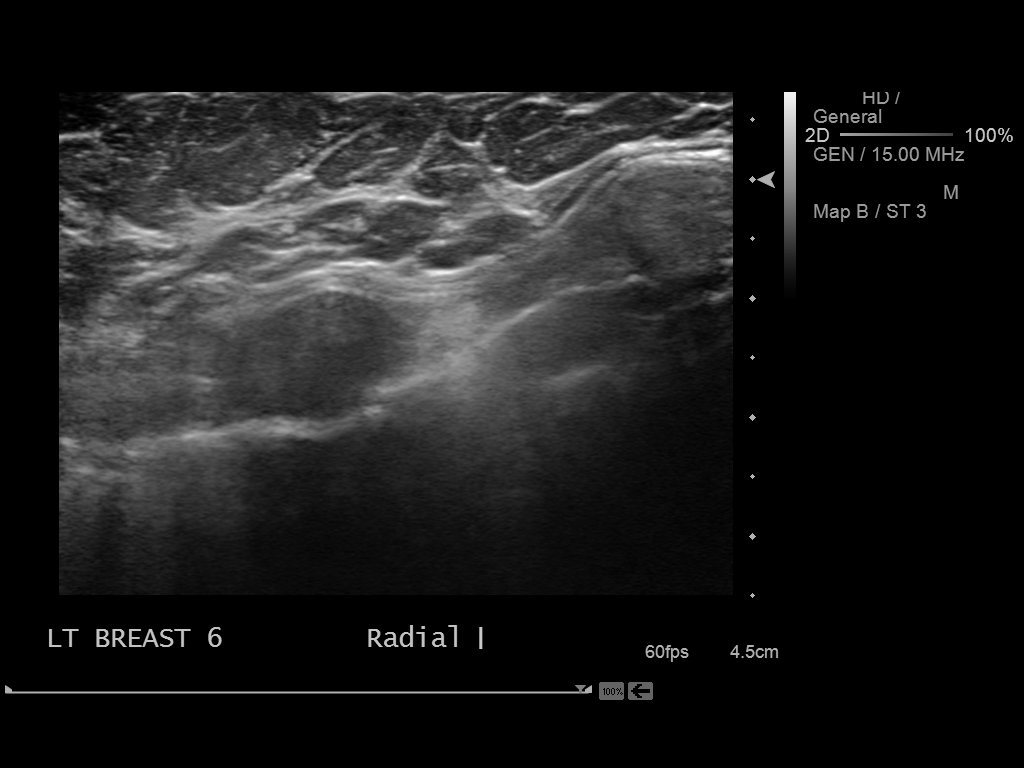
[im 2/2]
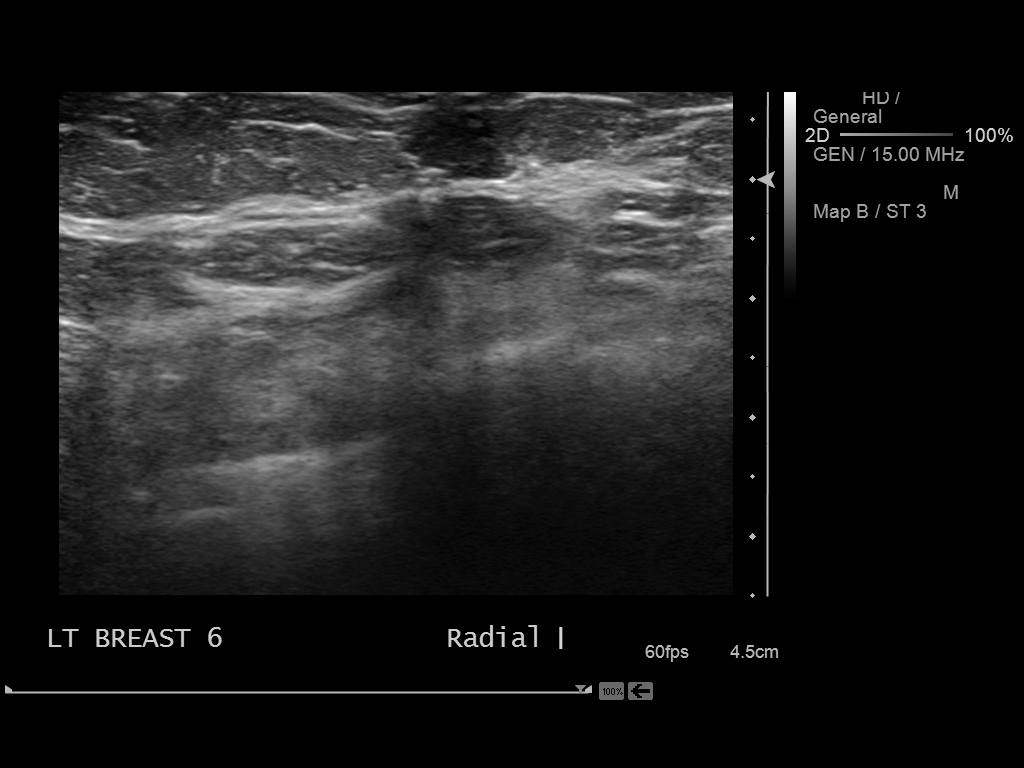

[2 of 2 positions shown; findings below may reference images not displayed]

FINDINGS: ACR Breast Density Category b:  There are scattered areas of
fibroglandular density.

There is no suspicious dominant mass, architectural distortion, or
calcification to suggest malignancy.

Mammographic images were processed with CAD.

On physical exam, no mass is palpated in the 6 o'clock position of
the left breast.

Ultrasound is performed, showing predominantly fatty tissue in the
area of concern at 6 o'clock near the inframammary fold.
IMPRESSION: No mammographic or sonographic evidence of malignancy.

RECOMMENDATION:
Yearly screening mammography is suggested with next scheduled exam
in [DATE].

I have discussed the findings and recommendations with the patient.
Results were also provided in writing at the conclusion of the
visit.  If applicable, a reminder letter will be sent to the
patient regarding the next appointment.

BI-RADS CATEGORY 1:  Negative.

## 2013-03-01 IMAGING — MG MM DIGITAL DIAGNOSTIC UNILAT L
3 series · 3 of 3 positions shown · non-contrast
Comparison: [DATE], [DATE], [DATE], [DATE]

CLINICAL DATA: The patient thinks she feels MATAHARI to marble sized
mass at 6 o'clock in the left inframammary fold intermittently.

DIGITAL DIAGNOSTIC LEFT MAMMOGRAM WITH CAD AND LEFT BREAST
ULTRASOUND:

[L CC]
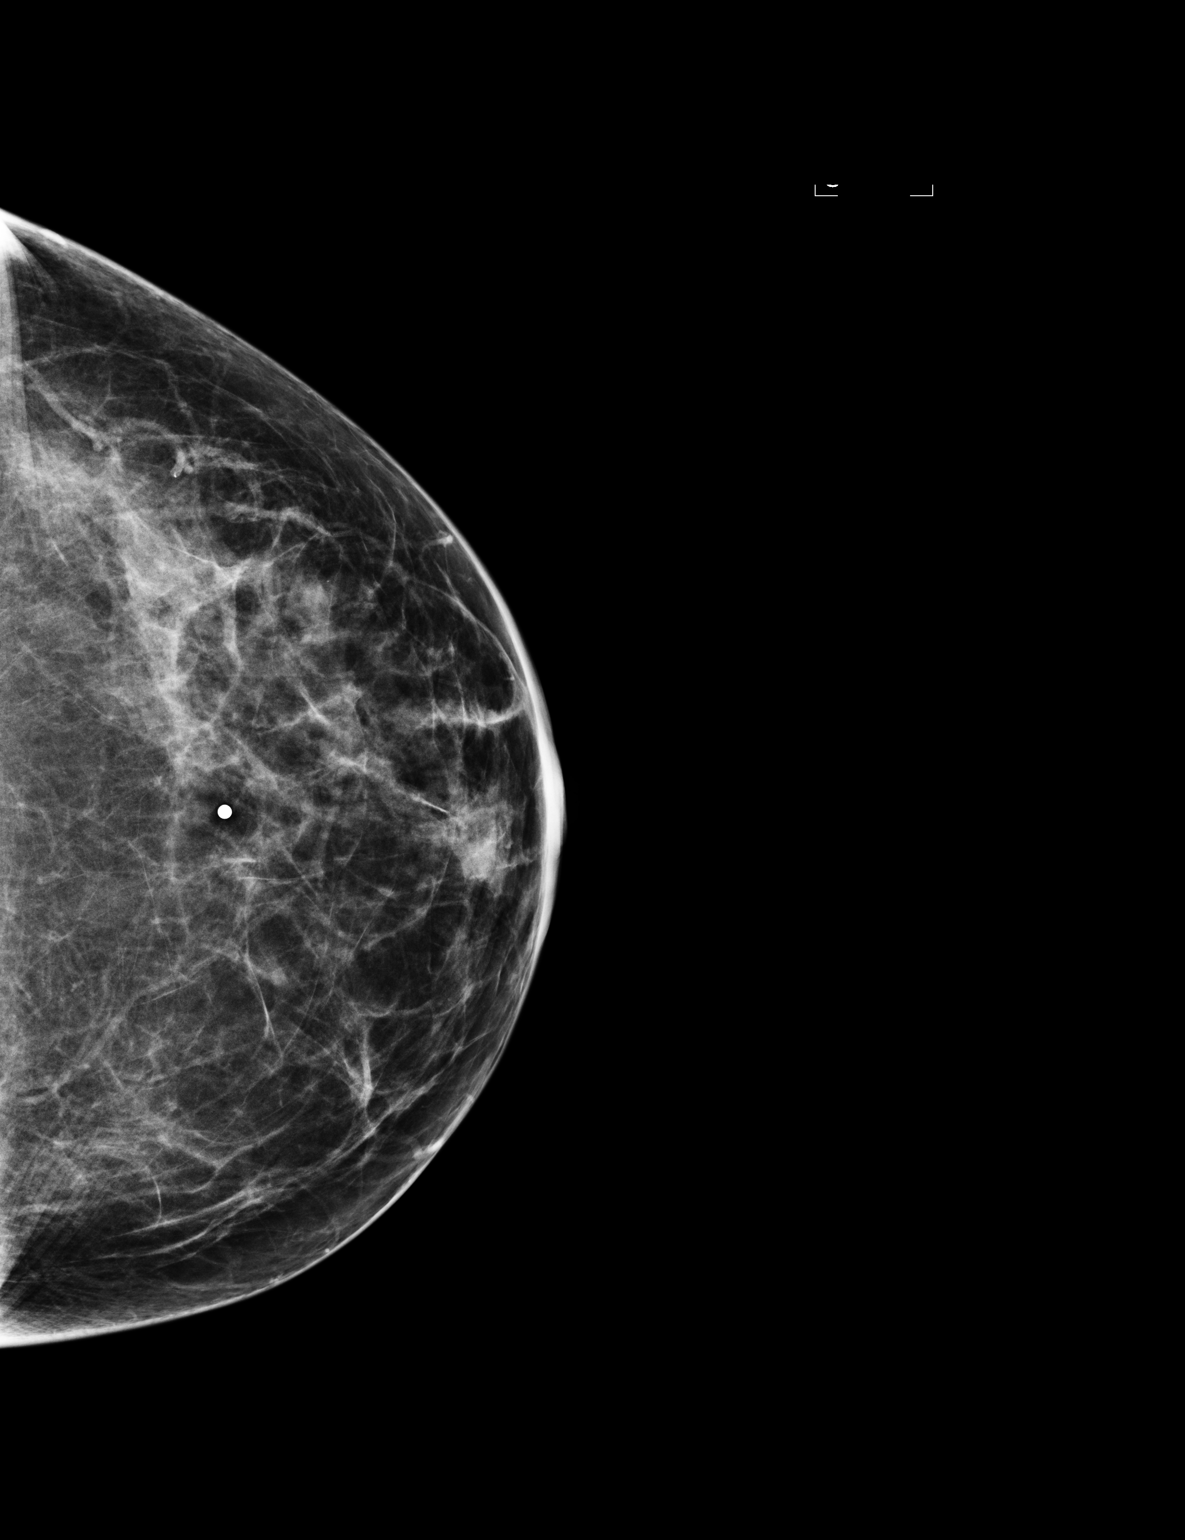

[L MLO]
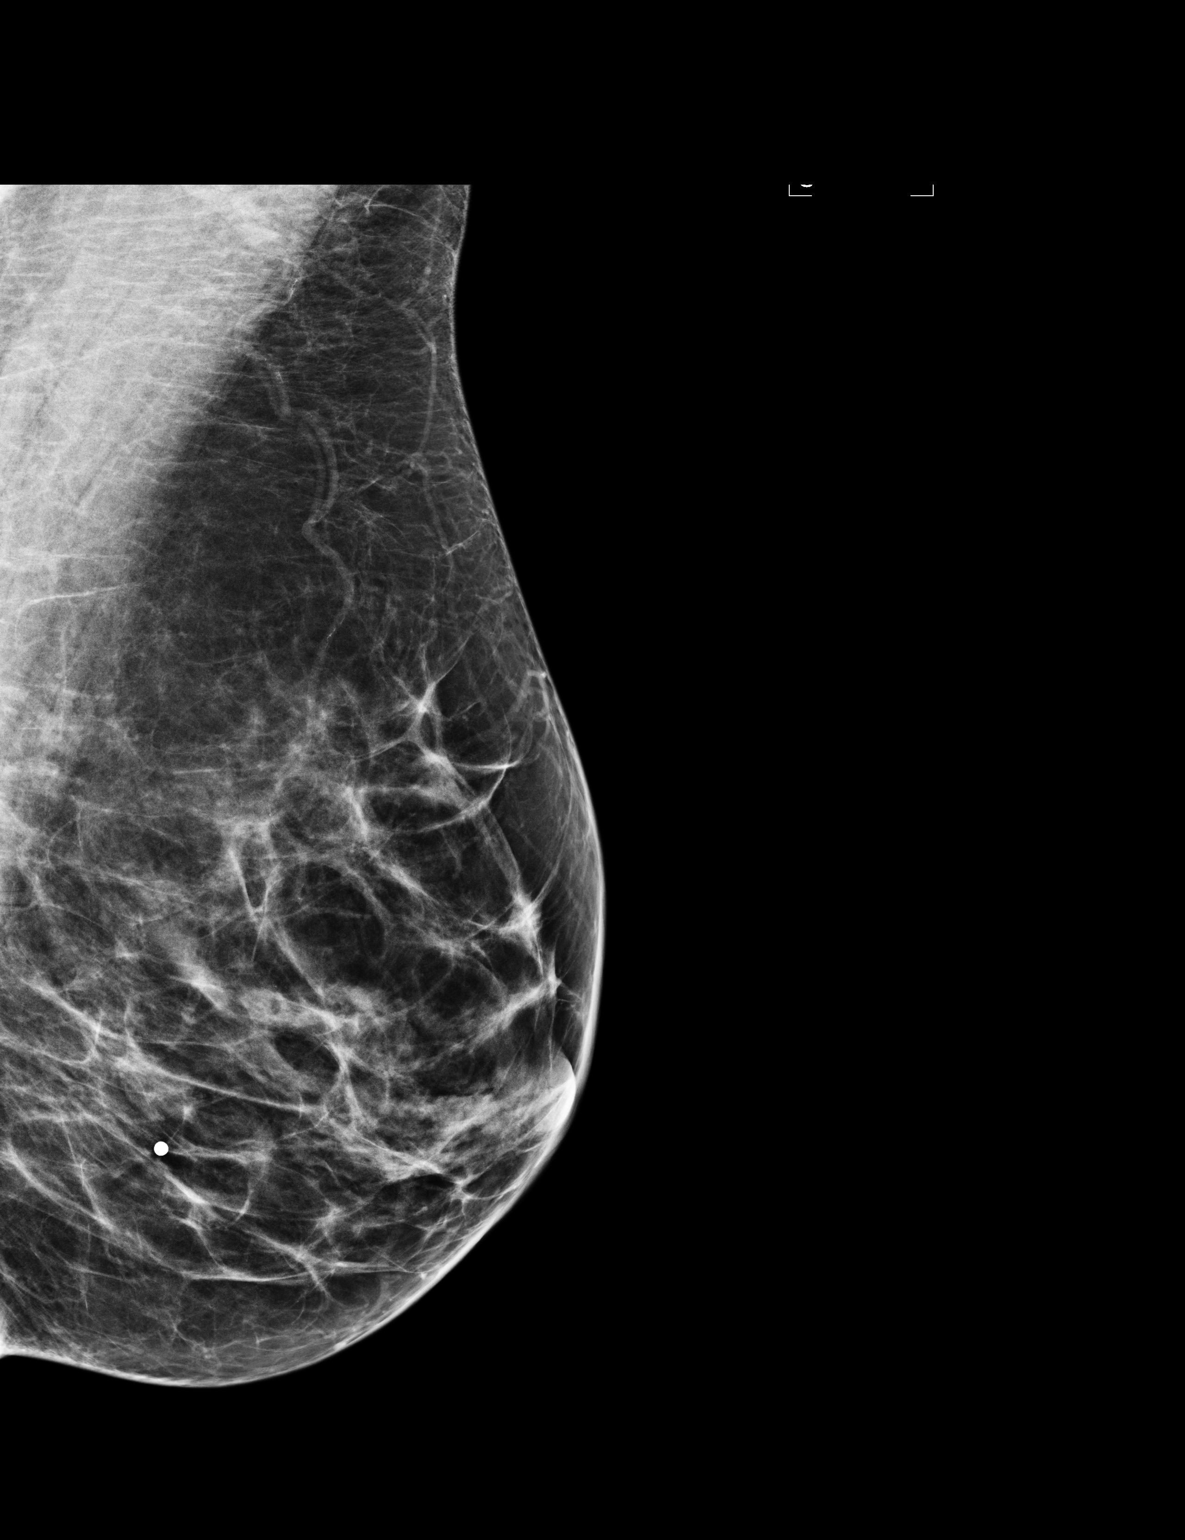

[L ML]
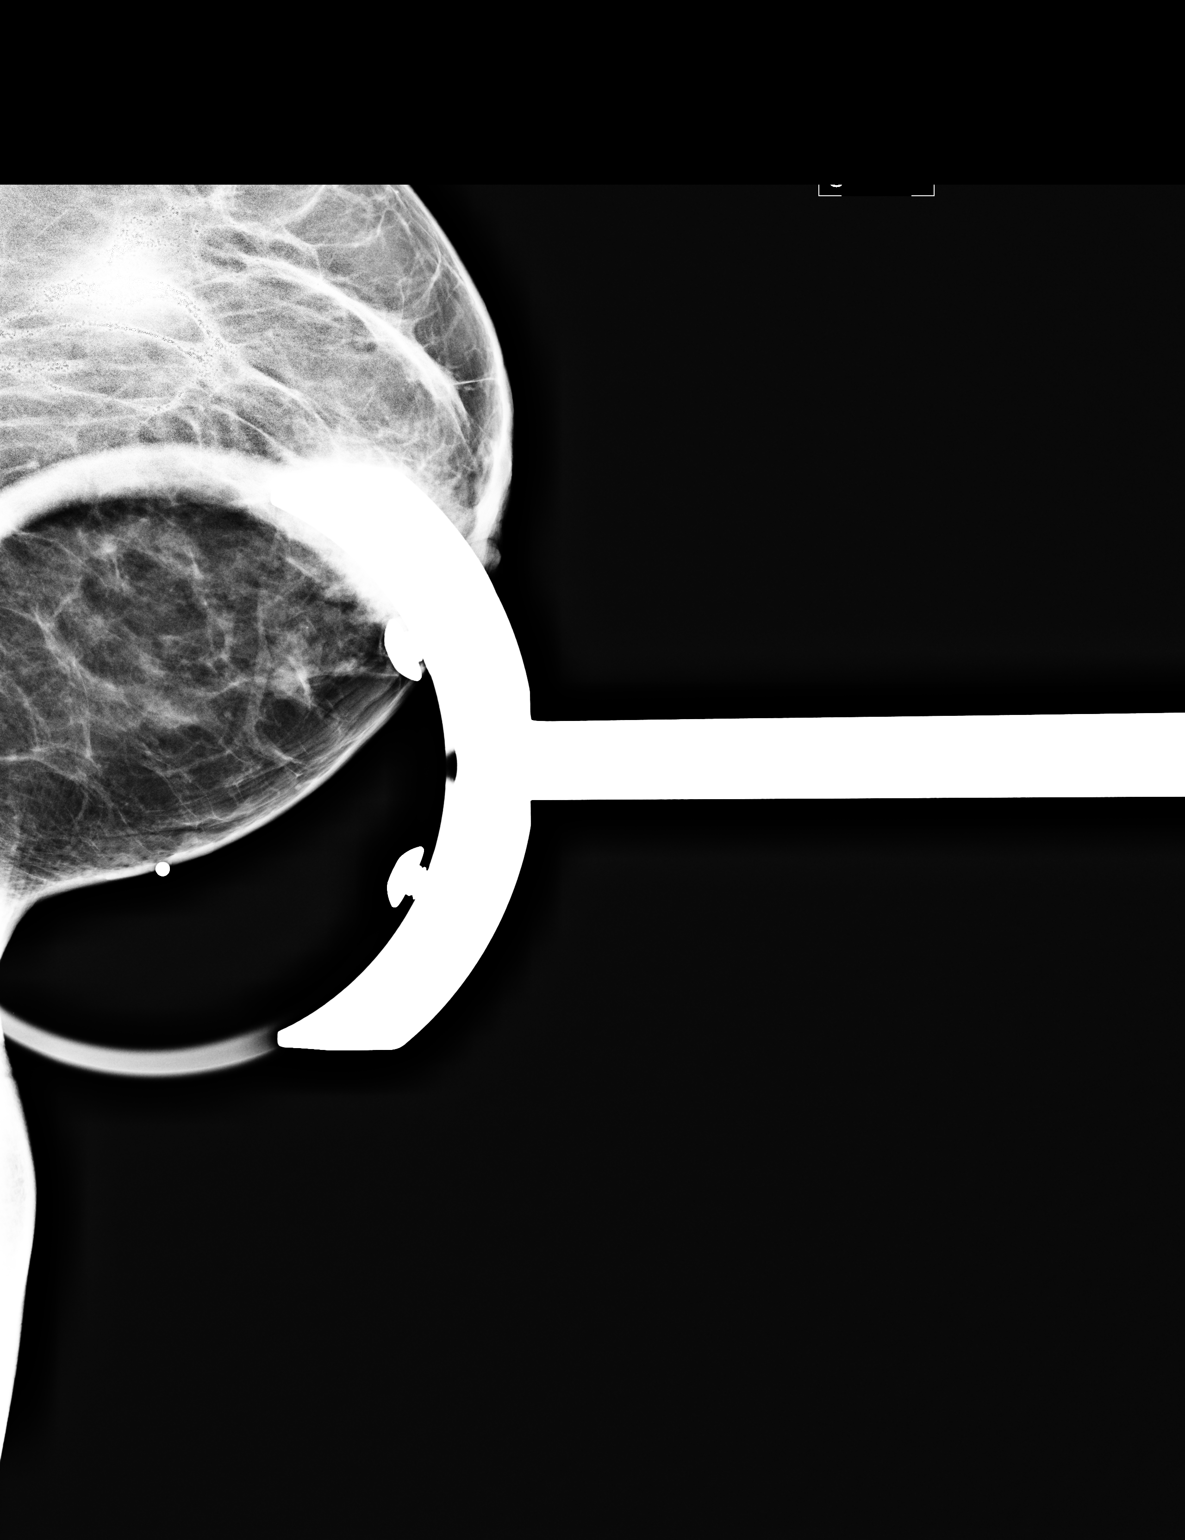

[3 of 3 positions shown; findings below may reference images not displayed]

FINDINGS: ACR Breast Density Category b:  There are scattered areas of
fibroglandular density.

There is no suspicious dominant mass, architectural distortion, or
calcification to suggest malignancy.

Mammographic images were processed with CAD.

On physical exam, no mass is palpated in the 6 o'clock position of
the left breast.

Ultrasound is performed, showing predominantly fatty tissue in the
area of concern at 6 o'clock near the inframammary fold.
IMPRESSION: No mammographic or sonographic evidence of malignancy.

RECOMMENDATION:
Yearly screening mammography is suggested with next scheduled exam
in [DATE].

I have discussed the findings and recommendations with the patient.
Results were also provided in writing at the conclusion of the
visit.  If applicable, a reminder letter will be sent to the
patient regarding the next appointment.

BI-RADS CATEGORY 1:  Negative.

## 2013-03-01 NOTE — Progress Notes (Addendum)
Patient History and Physical   Holly Murphy [id number] 1960-11-29 52 y.o. 03/01/2013  Referring MD: Holly Overman, MD.  Chief Complaint:  Low white count  HPI:  Thank you Dr. Lodema Murphy for referring [name]. 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  She is here for evaluation of low white count. Looking back at her records, leukopenia has been present at least since December 2010, that time patient had a WBC of 3.6K with essentially normal the hemoglobin and platelet count.  There were no differential counts the time.  Since then wbc has ranged between 2.8-4 K and absolute neutrophil count has ranged between 1.4-1.9 K . All this while she's maintained normal platelet count and hemoglobin. Recent CBC was on 01/19/2013 which showed WBC of 3.4, hemoglobin of 12.9 , platelet of  267K with absolute neutrophil count of 1.4K   She feels well and has no history of chronic or recurrent infections.  She denies any unintended weight loss, night sweats, fever or chills.  She has not noticed any lymphadenopathy.  She also denies any current or new medication.  She denies notes taking any herbal supplement.   PMH: Past Medical History  Diagnosis Date  . Seasonal allergies   . Depression 06/2009  . Perimenopausal   . Situational stress     with Mom's deterioating health . Off celexa for at least 2 months     History reviewed. No pertinent past surgical history.  Allergies: No Known Allergies  Medications: Current outpatient prescriptions:acetaminophen (TYLENOL) 500 MG tablet, Take 500 mg by mouth every 6 (six) hours as needed.  , Disp: , Rfl: ;  diphenhydramine-acetaminophen (TYLENOL PM) 25-500 MG TABS, Take 1 tablet by mouth at bedtime as needed., Disp: , Rfl: ;  ergocalciferol (VITAMIN D2) 50000 UNITS capsule, Take 1 capsule (50,000 Units total) by mouth once a week. One capsule once weekly, Disp: 12 capsule, Rfl: 1 senna (SENOKOT) 8.6  MG tablet, Take 1 tablet by mouth as needed.  , Disp: , Rfl:    Social History:   reports that she has never smoked. She has never used smokeless tobacco. She reports that she does not drink alcohol or use illicit drugs. 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 Married , with 2 children.  Family History: Family History  Problem Relation Age of Onset  . Cancer      family history   . Diabetes      family history   . Heart defect      family history   . Arthritis      family history     Review of Systems:14 point review of system is as in the history above otherwise negative.    Physical Exam: Blood pressure 124/77, pulse 77, temperature 98.8 F (37.1 C), temperature source Oral, resp. rate 14, height 5\' 2"  (1.575 m), weight 125 lb (56.7 kg). GENERAL: No distress, .  SKIN:  No rashes or significant lesions , no ecchymosis or petechia or rash. HEAD: Normocephalic, No masses, lesions, tenderness or abnormalities  EYES: Conjunctiva are pink , non-injected and no jaundice. ENT: External ears normal ,lips, buccal mucosa, and tongue normal and mucous membranes are moist . LYMPH: No palpable lymphadenopathy,  In the neck supraclavicular area or axilla or inguinal areas. LUNGS: Clear to auscultation , no crackles or wheezes HEART: regular rate & rhythm, no murmurs, no gallops, S1 normal and S2 normal  ABDOMEN:  Abdomen soft, non-tender, normal bowel sounds, no masses or organomegaly and no hepatosplenomegaly palpable.  EXTREMITIES: No edema, no skin discoloration or tenderness.     Lab Results: Lab Results  Component Value Date   WBC 3.4* 01/19/2013   HGB 12.9 01/19/2013   HCT 37.9 01/19/2013   MCV 91.8 01/19/2013   PLT 267 01/19/2013  ANC 1.4K  Labs today are pending.    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 This has been present since 06/2009 at the least.   However in the range her neutrophil count had the existed, patient is in increase risk  of infection.Usually patient with absolute neutrophil count of 500 or more do not have a significant risk of infection . Also she lacks B symptoms and overall feels well which supports diagnosis of constitutional etiology.  Recommendations: 1. A repeat CBC, CMP, LDH  and vitamin B12 with review of peripheral blood smear today.  If you've these are normal patient will not need additional workup our routine hematology follow up. 2. I have made her return visit in when necessary basis pending review of the above lab data. 3. I reassured the patient that there is no obvious clinical evidence of malignancy or significant hematologic abnormality.  Besides her other counts are normal.  Thank you Dr. Lodema Murphy for this kind referral, and I will update you on review of peripheral blood smear and today's labs in the addendum to follow.  All questions were satisfactorily answered.She knows to call if she has any concern.  I spent 50% of the time was spent counseling the patient face to face. The total time spent in the appointment was 45 minutes.   Holly Hammers, MD FACP. Hematology/Oncology.    Addendum: Review of peripheral blood smear showed  granular lymphocytes, band forms, adequate platelets and normocytic and normochromic red blood cells . Due to granular lymphocytes on smear flow cytometric we'll be ordered to rule out the remote possibility of LGL since this can sometimes be associated with cytopenias . However I repeat CBC at this visit shows normal values including differentials which makes this unlikely but still possible.  Flow cytometry 03/03/2013 did not reveal any evidence of monoclonal B-cell or T-cell population.  No additional workup is indicated at this time. Result was discussed with patient and  routine medical care with Dr. Garnette Murphy recommended.  Patient can was referred back for any new concern otherwise routine hematology follow up is not advised that this time.

## 2013-03-01 NOTE — Progress Notes (Signed)
Holly Murphy presented for [employer]. Labs per MD order drawn via Peripheral Line 23 gauge needle inserted in right AC  Good blood return present. Procedure without incident.  Needle removed intact. Patient tolerated procedure well.

## 2013-03-01 NOTE — Telephone Encounter (Signed)
Pt had questions and they were answered.

## 2013-03-01 NOTE — Patient Instructions (Signed)
[facility] Discharge Instructions  RECOMMENDATIONS MADE BY THE CONSULTANT AND ANY TEST RESULTS WILL BE SENT TO YOUR REFERRING PHYSICIAN.  EXAM FINDINGS BY THE PHYSICIAN TODAY AND SIGNS OR SYMPTOMS TO REPORT TO CLINIC OR PRIMARY PHYSICIAN: Exam and discussion by Dr. Sharia Reeve.  Probably nothing to worry aboutn  MEDICATIONS PRESCRIBED:  none  INSTRUCTIONS GIVEN AND DISCUSSED: none  SPECIAL INSTRUCTIONS/FOLLOW-UP: As needed.  Thank you for choosing [facility] to provide your oncology and hematology care.  To afford each patient quality time with our providers, please arrive at least 15 minutes before your scheduled appointment time.  With your help, our goal is to use those 15 minutes to complete the necessary work-up to ensure our physicians have the information they need to help with your evaluation and healthcare recommendations.    Effective January 1st, 2014, we ask that you re-schedule your appointment with our physicians should you arrive 10 or more minutes late for your appointment.  We strive to give you quality time with our providers, and arriving late affects you and other patients whose appointments are after yours.    Again, thank you for choosing [facility].  Our hope is that these requests will decrease the amount of time that you wait before being seen by our physicians.       _____________________________________________________________  Should you have questions after your visit to [facility], please contact our office at [phone] between the hours of 8:30 a.m. and 5:00 p.m.  Voicemails left after 4:30 p.m. will not be returned until the following business day.  For prescription refill requests, have your pharmacy contact our office with your prescription refill request.

## 2013-03-03 ENCOUNTER — Encounter (HOSPITAL_BASED_OUTPATIENT_CLINIC_OR_DEPARTMENT_OTHER): Payer: 59

## 2013-03-03 DIAGNOSIS — D72819 Decreased white blood cell count, unspecified: Secondary | ICD-10-CM

## 2013-03-03 NOTE — Progress Notes (Signed)
Labs drawn today for Flow Cytometry 

## 2013-03-05 NOTE — Assessment & Plan Note (Signed)
Pot concern and slight noodularityin area of concern will refer for imaging , low index of suspiscion for actual pathology

## 2013-03-05 NOTE — Assessment & Plan Note (Signed)
Unchanged and ongoing , but no interest in furhter testing at this time

## 2013-03-05 NOTE — Assessment & Plan Note (Signed)
Encouraged to commit to 6 month course of weekly vit D

## 2013-03-05 NOTE — Assessment & Plan Note (Signed)
Mild , over several years will get hematology eval, I believe this is benign , rest of  Blood count is normal

## 2013-03-29 ENCOUNTER — Other Ambulatory Visit (INDEPENDENT_AMBULATORY_CARE_PROVIDER_SITE_OTHER): Payer: Self-pay | Admitting: *Deleted

## 2013-03-29 ENCOUNTER — Encounter (INDEPENDENT_AMBULATORY_CARE_PROVIDER_SITE_OTHER): Payer: Self-pay | Admitting: *Deleted

## 2013-03-29 DIAGNOSIS — Z1211 Encounter for screening for malignant neoplasm of colon: Secondary | ICD-10-CM

## 2013-03-30 ENCOUNTER — Telehealth (INDEPENDENT_AMBULATORY_CARE_PROVIDER_SITE_OTHER): Payer: Self-pay | Admitting: *Deleted

## 2013-03-30 DIAGNOSIS — Z1211 Encounter for screening for malignant neoplasm of colon: Secondary | ICD-10-CM

## 2013-03-30 MED ORDER — PEG-KCL-NACL-NASULF-NA ASC-C 100 G PO SOLR: 1.0000 | Freq: Once | ORAL | Status: DC

## 2013-03-30 NOTE — Telephone Encounter (Signed)
Patient need movi prep 

## 2013-04-11 ENCOUNTER — Telehealth (HOSPITAL_COMMUNITY): Payer: Self-pay | Admitting: Hematology and Oncology

## 2013-04-11 NOTE — Telephone Encounter (Signed)
I was given a bill by Yuridiana to review. She was charged twice for a new pt visit. I had to enter her account to review the billing  Inquiry. I emailed Peyton Najjar to correct the mistake.

## 2013-04-27 ENCOUNTER — Telehealth (INDEPENDENT_AMBULATORY_CARE_PROVIDER_SITE_OTHER): Payer: Self-pay | Admitting: *Deleted

## 2013-04-27 NOTE — Telephone Encounter (Signed)
agree

## 2013-04-27 NOTE — Telephone Encounter (Signed)
  Procedure: tcs  Reason/Indication:  screening  Has patient had this procedure before?  no  If so, when, by whom and where?    Is there a family history of colon cancer?  no  Who?  What age when diagnosed?    Is patient diabetic?   no      Does patient have prosthetic heart valve?  no  Do you have a pacemaker?  no  Has patient ever had endocarditis? no  Has patient had joint replacement within last 12 months?  no  Does patient tend to be constipated or take laxatives? no  Is patient on Coumadin, Plavix and/or Aspirin? no  Medications: tylenol prn  Allergies: nkda  Medication Adjustment:   Procedure date & time: 05/24/13 at 930

## 2013-05-10 ENCOUNTER — Encounter (HOSPITAL_COMMUNITY): Payer: Self-pay | Admitting: Pharmacy Technician

## 2013-05-18 ENCOUNTER — Other Ambulatory Visit: Payer: Self-pay

## 2013-05-22 ENCOUNTER — Encounter (INDEPENDENT_AMBULATORY_CARE_PROVIDER_SITE_OTHER): Payer: Self-pay | Admitting: Internal Medicine

## 2013-05-24 ENCOUNTER — Encounter (HOSPITAL_COMMUNITY)
Admission: RE | Disposition: A | Discharge status: Home / Self Care | Payer: Self-pay | Source: Ambulatory Visit | Attending: Internal Medicine

## 2013-05-24 ENCOUNTER — Encounter (HOSPITAL_COMMUNITY): Payer: Self-pay

## 2013-05-24 ENCOUNTER — Ambulatory Visit (HOSPITAL_COMMUNITY)
Admission: RE | Admit: 2013-05-24 | Discharge: 2013-05-24 | Disposition: A | Discharge status: Home / Self Care | Payer: 59 | Source: Ambulatory Visit | Attending: Internal Medicine | Admitting: Internal Medicine

## 2013-05-24 DIAGNOSIS — Z1211 Encounter for screening for malignant neoplasm of colon: Secondary | ICD-10-CM

## 2013-05-24 DIAGNOSIS — K644 Residual hemorrhoidal skin tags: Secondary | ICD-10-CM | POA: Insufficient documentation

## 2013-05-24 DIAGNOSIS — K6389 Other specified diseases of intestine: Secondary | ICD-10-CM | POA: Insufficient documentation

## 2013-05-24 HISTORY — PX: COLONOSCOPY: SHX5424

## 2013-05-24 SURGERY — COLONOSCOPY
Anesthesia: Moderate Sedation

## 2013-05-24 MED ORDER — MEPERIDINE HCL 50 MG/ML IJ SOLN
INTRAMUSCULAR | Status: AC
  Filled 2013-05-24: qty 1

## 2013-05-24 MED ORDER — NYSTATIN-TRIAMCINOLONE 100000-0.1 UNIT/GM-% EX CREA: 1.0000 "application " | TOPICAL_CREAM | Freq: Two times a day (BID) | CUTANEOUS | Status: DC

## 2013-05-24 MED ORDER — MEPERIDINE HCL 50 MG/ML IJ SOLN
INTRAMUSCULAR | Status: DC | PRN
  Administered 2013-05-24 (×2): 20 mg via INTRAVENOUS
  Administered 2013-05-24: 10 mg via INTRAVENOUS

## 2013-05-24 MED ORDER — MIDAZOLAM HCL 5 MG/5ML IJ SOLN
INTRAMUSCULAR | Status: DC | PRN
  Administered 2013-05-24 (×3): 2 mg via INTRAVENOUS
  Administered 2013-05-24: 1 mg via INTRAVENOUS

## 2013-05-24 MED ORDER — SODIUM CHLORIDE 0.9 % IV SOLN
INTRAVENOUS | Status: DC
  Administered 2013-05-24: 09:00:00 via INTRAVENOUS

## 2013-05-24 MED ORDER — STERILE WATER FOR IRRIGATION IR SOLN
Status: DC | PRN
  Administered 2013-05-24: 10:00:00

## 2013-05-24 MED ORDER — MIDAZOLAM HCL 5 MG/5ML IJ SOLN
INTRAMUSCULAR | Status: AC
  Filled 2013-05-24: qty 10

## 2013-05-24 NOTE — H&P (Signed)
Holly Murphy is an 52 y.o. female.   Chief Complaint: Patient's here for colonoscopy. 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 She denies abdominal pain change in bowel habits or rectal bleeding. Family history is negative for colorectal carcinoma. Past Medical History  Diagnosis Date  . Seasonal allergies   . Depression 06/2009  . Perimenopausal   . Situational stress     with Mom's deterioating health . Off celexa for at least 2 months     History reviewed. No pertinent past surgical history.  Family History  Problem Relation Age of Onset  . Cancer      family history   . Diabetes      family history   . Heart defect      family history   . Arthritis      family history    Social History:  reports that she has never smoked. She has never used smokeless tobacco. She reports that she does not drink alcohol or use illicit drugs.  Allergies: No Known Allergies  Medications Prior to Admission  Medication Sig Dispense Refill  . naproxen sodium (ANAPROX) 220 MG tablet Take 220 mg by mouth 2 (two) times daily with a meal.        No results found for this or any previous visit (from the past 48 hour(s)). No results found.  ROS  Blood pressure 124/81, pulse 98, temperature 97.6 F (36.4 C), temperature source Oral, resp. rate 17, height 5' 2.5" (1.588 m), weight 129 lb (58.514 kg), SpO2 98.00%. Physical Exam  Constitutional: She appears well-developed and well-nourished.  HENT:  Mouth/Throat: Oropharynx is clear and moist.  Eyes: Conjunctivae are normal. No scleral icterus.  Neck: No thyromegaly present.  Cardiovascular: Normal rate, regular rhythm and normal heart sounds.   No murmur heard. Respiratory: Effort normal and breath sounds normal.  GI: Soft. She exhibits no distension and no mass. There is no tenderness.  Musculoskeletal: She exhibits no edema.  Lymphadenopathy:    She has no cervical adenopathy.   Neurological: She is alert.  Skin: Skin is warm and dry.     Assessment/Plan Average risk screening colonoscopy.  Derenda Giddings U 05/24/2013, 9:45 AM

## 2013-05-24 NOTE — Op Note (Signed)
COLONOSCOPY PROCEDURE REPORT  PATIENT:  Holly Murphy  MR#:  [id number] Birthdate:  02/08/1961, 52 y.o., female Endoscopist:  Dr. Malissa Hippo, MD Referred By:  Dr. Syliva Overman, MD  Procedure Date: 05/24/2013  Procedure:   Colonoscopy  Indications:  Patient is 52 year-old Caucasian female who is undergoing average risk screening colonoscopy.  Informed Consent:  The procedure and risks were reviewed with the patient and informed consent was obtained.  Medications:  Demerol 50 mg IV Versed 7 mg IV  Description of procedure:  After a digital rectal exam was performed, that colonoscope was advanced from the anus through the rectum and colon to the area of the cecum, ileocecal valve and appendiceal orifice. The cecum was deeply intubated. These structures were well-seen and photographed for the record. From the level of the cecum and ileocecal valve, the scope was slowly and cautiously withdrawn. The mucosal surfaces were carefully surveyed utilizing scope tip to flexion to facilitate fold flattening as needed. The scope was pulled down into the rectum where a thorough exam including retroflexion was performed.  Findings:   Prep excellent. Patchy mucosal pigmentation noted the rectosigmoid junction and distal sigmoid colon. Rest of the mucosa was normal. Normal rectal mucosa. Small hemorrhoids below the dentate line.   Therapeutic/Diagnostic Maneuvers Performed:  None  Complications:  None  Cecal Withdrawal Time:  9 minutes  Impression: Examination performed to cecum. 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 Small external hemorrhoids. No evidence of colonic polyps.  Recommendations:  Standard instructions given. Next screening exam in 10 years.  REHMAN,NAJEEB U  05/24/2013 10:15 AM  CC: Dr. Syliva Overman, MD & Dr. Bonnetta Barry ref. provider found

## 2013-05-29 ENCOUNTER — Encounter (HOSPITAL_COMMUNITY): Payer: Self-pay | Admitting: Internal Medicine

## 2013-07-04 ENCOUNTER — Other Ambulatory Visit: Payer: Self-pay | Admitting: Family Medicine

## 2013-07-04 DIAGNOSIS — Z139 Encounter for screening, unspecified: Secondary | ICD-10-CM

## 2013-07-11 ENCOUNTER — Ambulatory Visit (HOSPITAL_COMMUNITY)
Admission: RE | Admit: 2013-07-11 | Discharge: 2013-07-11 | Disposition: A | Discharge status: Home / Self Care | Payer: 59 | Source: Ambulatory Visit | Attending: Family Medicine | Admitting: Family Medicine

## 2013-07-11 DIAGNOSIS — Z139 Encounter for screening, unspecified: Secondary | ICD-10-CM

## 2013-07-11 DIAGNOSIS — Z1231 Encounter for screening mammogram for malignant neoplasm of breast: Secondary | ICD-10-CM | POA: Insufficient documentation

## 2013-07-11 IMAGING — MG MM DIGITAL SCREENING
4 series · 4 of 4 positions shown · non-contrast
Comparison: Previous exam(s).

CLINICAL DATA: Screening.

EXAM:
DIGITAL SCREENING BILATERAL MAMMOGRAM WITH CAD

[L CC]
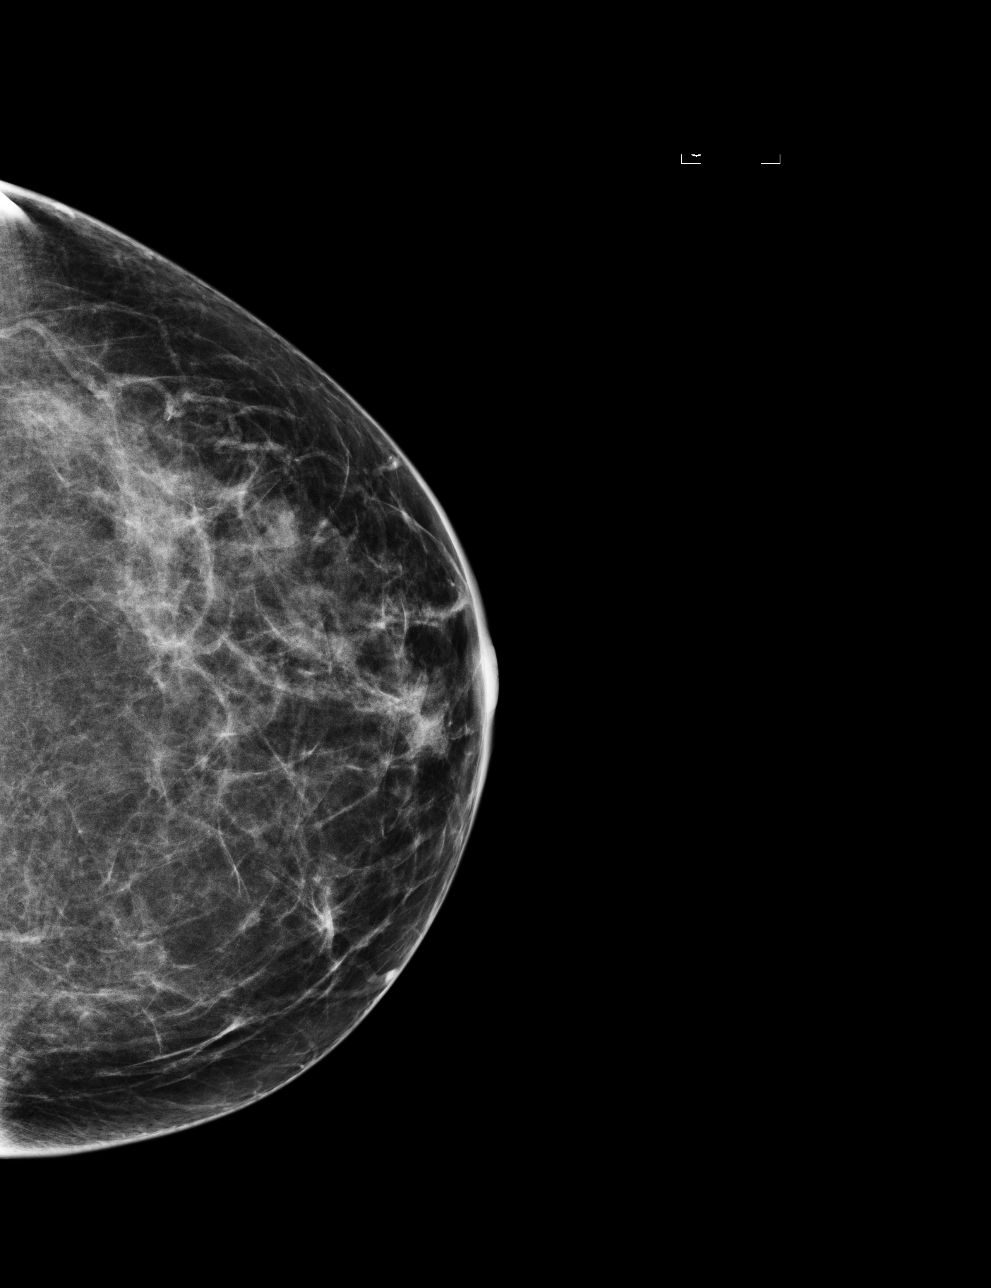

[L MLO]
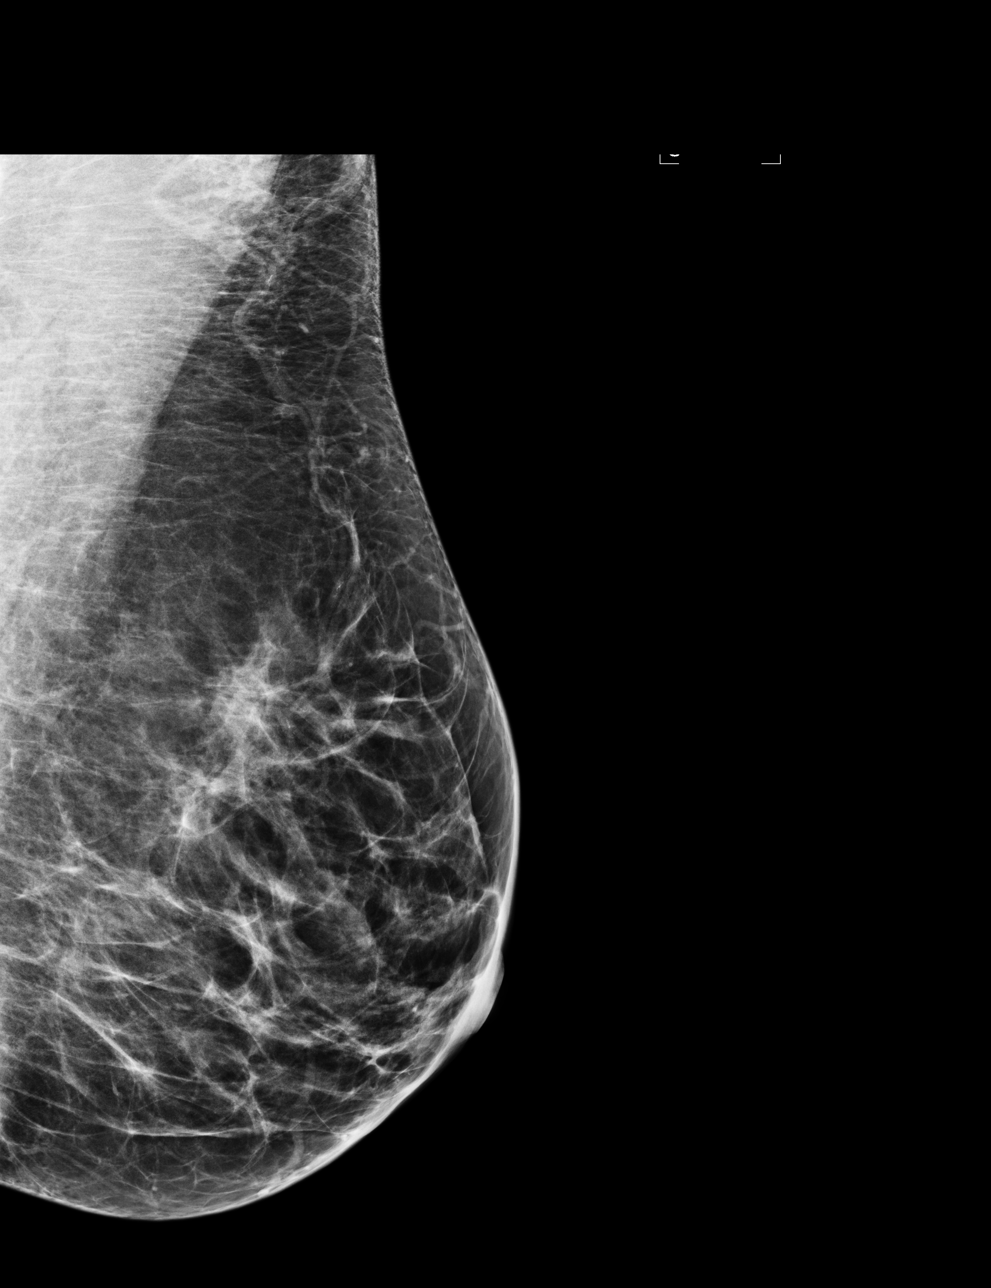

[R CC]
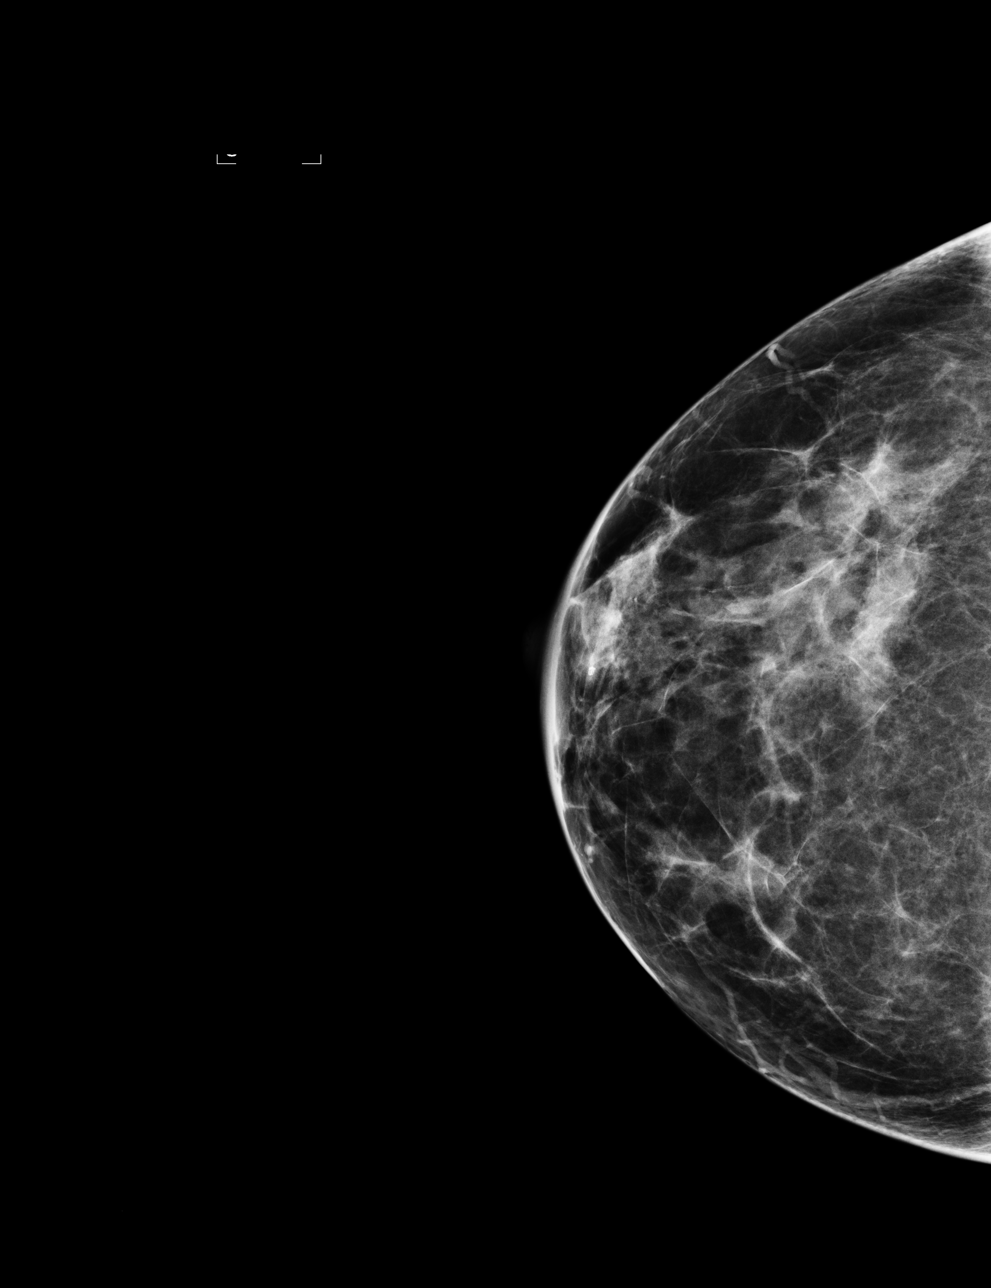

[R MLO]
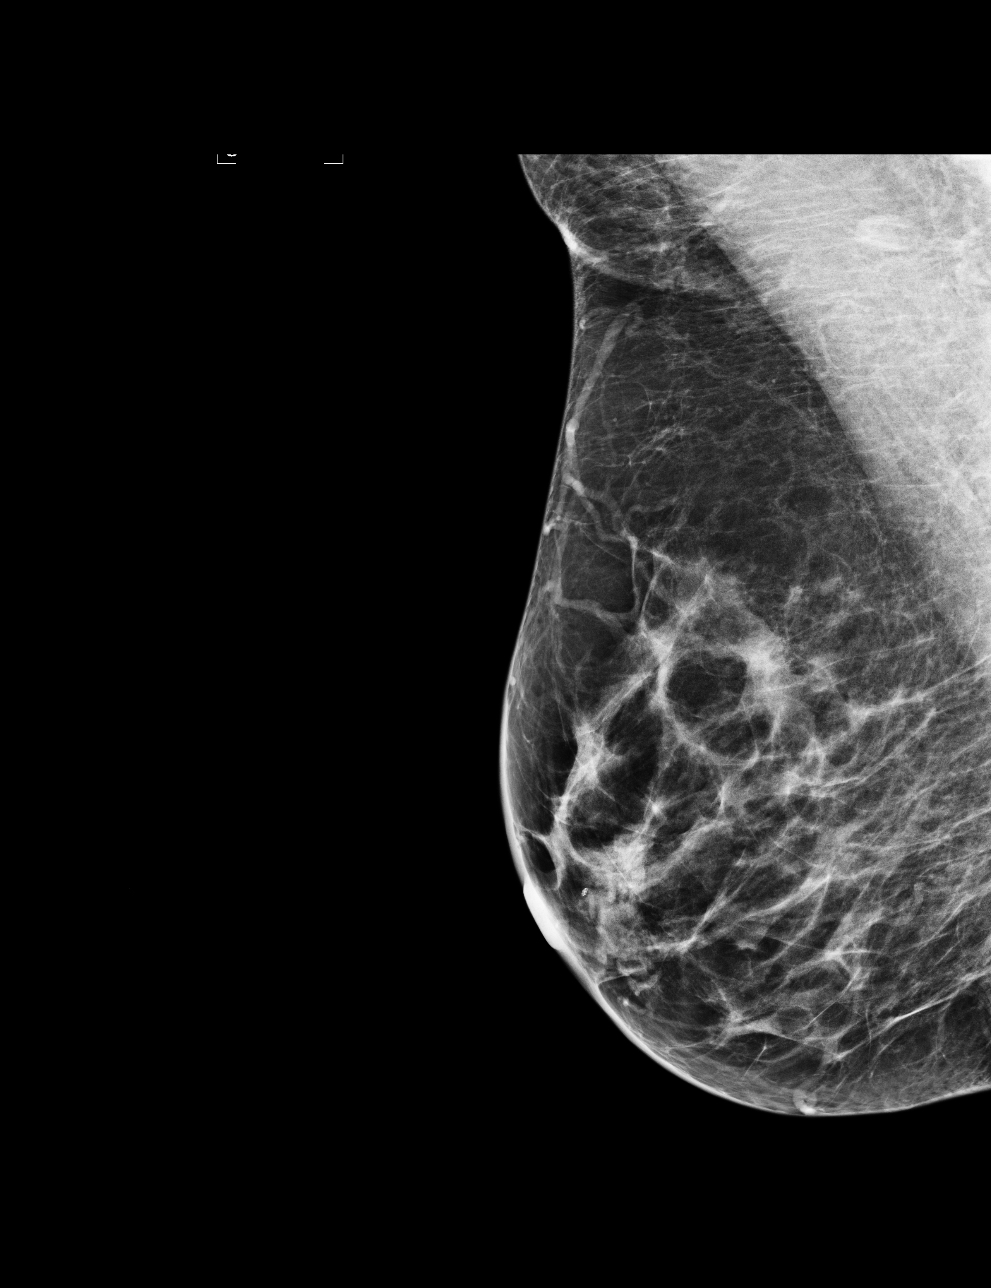

[4 of 4 positions shown; findings below may reference images not displayed]

ACR Breast Density Category b: There are scattered areas of
fibroglandular density.
FINDINGS: There are no findings suspicious for malignancy. Images were
processed with CAD.
IMPRESSION: No mammographic evidence of malignancy. A result letter of this
screening mammogram will be mailed directly to the patient.

RECOMMENDATION:
Screening mammogram in one year. (Code:[HN])

BI-RADS CATEGORY  1: Negative

## 2013-07-13 ENCOUNTER — Encounter: Payer: Self-pay | Admitting: Family Medicine

## 2013-08-24 ENCOUNTER — Ambulatory Visit: Payer: 59 | Admitting: Family Medicine

## 2013-09-25 ENCOUNTER — Other Ambulatory Visit: Payer: Self-pay | Admitting: Family Medicine

## 2013-09-27 LAB — CBC WITH DIFFERENTIAL/PLATELET
BASOS PCT: 1 % (ref 0–1)
Basophils Absolute: 0 10*3/uL (ref 0.0–0.1)
EOS PCT: 2 % (ref 0–5)
Eosinophils Absolute: 0.1 10*3/uL (ref 0.0–0.7)
HEMATOCRIT: 38.4 % (ref 36.0–46.0)
HEMOGLOBIN: 13.2 g/dL (ref 12.0–15.0)
Lymphocytes Relative: 51 % — ABNORMAL HIGH (ref 12–46)
Lymphs Abs: 1.5 10*3/uL (ref 0.7–4.0)
MCH: 31.1 pg (ref 26.0–34.0)
MCHC: 34.4 g/dL (ref 30.0–36.0)
MCV: 90.6 fL (ref 78.0–100.0)
MONO ABS: 0.2 10*3/uL (ref 0.1–1.0)
Monocytes Relative: 8 % (ref 3–12)
Neutro Abs: 1.1 10*3/uL — ABNORMAL LOW (ref 1.7–7.7)
Neutrophils Relative %: 38 % — ABNORMAL LOW (ref 43–77)
Platelets: 256 10*3/uL (ref 150–400)
RBC: 4.24 MIL/uL (ref 3.87–5.11)
RDW: 13.5 % (ref 11.5–15.5)
WBC: 2.9 10*3/uL — ABNORMAL LOW (ref 4.0–10.5)

## 2013-09-27 LAB — LIPID PANEL
Cholesterol: 184 mg/dL (ref 0–200)
HDL: 73 mg/dL (ref >39)
LDL CALC: 98 mg/dL (ref 0–99)
Total CHOL/HDL Ratio: 2.5 Ratio
Triglycerides: 65 mg/dL (ref <150)
VLDL: 13 mg/dL (ref 0–40)

## 2013-09-27 LAB — BASIC METABOLIC PANEL
BUN: 11 mg/dL (ref 6–23)
CHLORIDE: 106 meq/L (ref 96–112)
CO2: 31 mEq/L (ref 19–32)
CREATININE: 0.64 mg/dL (ref 0.50–1.10)
Calcium: 9.3 mg/dL (ref 8.4–10.5)
Glucose, Bld: 74 mg/dL (ref 70–99)
Potassium: 3.9 mEq/L (ref 3.5–5.3)
Sodium: 142 mEq/L (ref 135–145)

## 2013-09-27 LAB — TSH: TSH: 1.068 u[IU]/mL (ref 0.350–4.500)

## 2013-09-27 LAB — VITAMIN D 25 HYDROXY (VIT D DEFICIENCY, FRACTURES): VIT D 25 HYDROXY: 40 ng/mL (ref 30–89)

## 2013-10-02 ENCOUNTER — Ambulatory Visit (INDEPENDENT_AMBULATORY_CARE_PROVIDER_SITE_OTHER): Payer: 59 | Admitting: Family Medicine

## 2013-10-02 ENCOUNTER — Encounter: Payer: Self-pay | Admitting: Family Medicine

## 2013-10-02 VITALS — BP 110/72 | HR 79 | Resp 16 | Ht 63.0 in | Wt 133.1 lb

## 2013-10-02 DIAGNOSIS — D72819 Decreased white blood cell count, unspecified: Secondary | ICD-10-CM

## 2013-10-02 DIAGNOSIS — E559 Vitamin D deficiency, unspecified: Secondary | ICD-10-CM

## 2013-10-02 DIAGNOSIS — M949 Disorder of cartilage, unspecified: Secondary | ICD-10-CM

## 2013-10-02 DIAGNOSIS — M8580 Other specified disorders of bone density and structure, unspecified site: Secondary | ICD-10-CM

## 2013-10-02 DIAGNOSIS — M899 Disorder of bone, unspecified: Secondary | ICD-10-CM

## 2013-10-02 DIAGNOSIS — Z13820 Encounter for screening for osteoporosis: Secondary | ICD-10-CM

## 2013-10-02 DIAGNOSIS — M549 Dorsalgia, unspecified: Secondary | ICD-10-CM

## 2013-10-02 NOTE — Patient Instructions (Signed)
F/u in 6 month, call if you need me before  CBC  prior to visit.  It is important that you exercise regularly at least 30 minutes 5 times a week. If you develop chest pain, have severe difficulty breathing, or feel very tired, stop exercising immediately and seek medical attention    Consider chiropracter pr PT for back pain as needed  You are referred for dexa

## 2013-10-02 NOTE — Progress Notes (Signed)
   Subjective:    Patient ID: Holly Murphy, female    DOB: 03/17/1961, 53 y.o.   MRN: [id number]  HPI The PT is here for follow up and re-evaluation of chronic medical conditions, medication management and review of any available recent lab and radiology data.  Preventive health is updated, specifically  Cancer screening and Immunization.   Questions or concerns regarding consultations or procedures which the PT has had in the interim are  addressed.  There are no new concerns.  There are no specific complaints       Review of Systems See HPI Denies recent fever or chills. Denies sinus pressure, nasal congestion, ear pain or sore throat. Denies chest congestion, productive cough or wheezing. Denies chest pains, palpitations and leg swelling Denies abdominal pain, nausea, vomiting,diarrhea or constipation.   Denies dysuria, frequency, hesitancy or incontinence. Denies joint pain, swelling and limitation in mobility. Denies headaches, seizures, numbness, or tingling. Denies depression, anxiety or insomnia. Denies skin break down or rash.        Objective:   Physical Exam BP 110/72  Pulse 79  Resp 16  Ht 5\' 3"  (1.6 m)  Wt 133 lb 1.9 oz (60.383 kg)  BMI 23.59 kg/m2  SpO2 100% Patient alert and oriented and in no cardiopulmonary distress.  HEENT: No facial asymmetry, EOMI,   oropharynx pink and moist.  Neck supple no JVD, no mass.  Chest: Clear to auscultation bilaterally.  CVS: S1, S2 no murmurs, no S3.  ABD: Soft non tender.   Ext: No edema  MS: Adequate ROM spine, shoulders, hips and knees.  Skin: Intact, no ulcerations or rash noted.  Psych: Good eye contact, normal affect. Memory intact not anxious or depressed appearing.  CNS: CN 2-12 intact, power,  normal throughout.no focal deficits noted.        Assessment & Plan:  Leukopenia Follow cBC twice yearly, refer back to hematology if changes noted  Osteopenia Increased and commit to calcium  and D supplement daily and daily weight bearing exercise .Rept dexa in 2 to 3 years  BACK PAIN Less severe and less frrequent, uses OTC anti inflammatory as needed, continue same

## 2013-10-10 ENCOUNTER — Ambulatory Visit (HOSPITAL_COMMUNITY)
Admission: RE | Admit: 2013-10-10 | Discharge: 2013-10-10 | Disposition: A | Discharge status: Home / Self Care | Payer: 59 | Source: Ambulatory Visit | Attending: Family Medicine | Admitting: Family Medicine

## 2013-10-10 DIAGNOSIS — M899 Disorder of bone, unspecified: Secondary | ICD-10-CM | POA: Insufficient documentation

## 2013-10-10 DIAGNOSIS — Z780 Asymptomatic menopausal state: Secondary | ICD-10-CM | POA: Insufficient documentation

## 2013-10-10 DIAGNOSIS — Z13820 Encounter for screening for osteoporosis: Secondary | ICD-10-CM

## 2013-10-10 DIAGNOSIS — M949 Disorder of cartilage, unspecified: Principal | ICD-10-CM

## 2013-12-27 DIAGNOSIS — M8580 Other specified disorders of bone density and structure, unspecified site: Secondary | ICD-10-CM | POA: Insufficient documentation

## 2013-12-27 NOTE — Assessment & Plan Note (Addendum)
Increased and commit to calcium and D supplement daily and daily weight bearing exercise .Rept dexa in 2 to 3 years

## 2013-12-27 NOTE — Assessment & Plan Note (Signed)
Follow cBC twice yearly, refer back to hematology if changes noted

## 2013-12-27 NOTE — Assessment & Plan Note (Signed)
Less severe and less frrequent, uses OTC anti inflammatory as needed, continue same

## 2014-03-30 ENCOUNTER — Other Ambulatory Visit: Payer: Self-pay | Admitting: Family Medicine

## 2014-03-30 LAB — LIPID PANEL
CHOLESTEROL: 193 mg/dL (ref 0–200)
HDL: 76 mg/dL (ref >39)
LDL Cholesterol: 107 mg/dL — ABNORMAL HIGH (ref 0–99)
Total CHOL/HDL Ratio: 2.5 Ratio
Triglycerides: 48 mg/dL (ref <150)
VLDL: 10 mg/dL (ref 0–40)

## 2014-03-31 LAB — VITAMIN D 25 HYDROXY (VIT D DEFICIENCY, FRACTURES): Vit D, 25-Hydroxy: 21 ng/mL — ABNORMAL LOW (ref 30–89)

## 2014-04-10 ENCOUNTER — Encounter: Payer: Self-pay | Admitting: Family Medicine

## 2014-04-10 ENCOUNTER — Ambulatory Visit (INDEPENDENT_AMBULATORY_CARE_PROVIDER_SITE_OTHER): Payer: 59 | Admitting: Family Medicine

## 2014-04-10 VITALS — BP 110/74 | HR 81 | Resp 16 | Ht 63.0 in | Wt 134.4 lb

## 2014-04-10 DIAGNOSIS — M545 Low back pain, unspecified: Secondary | ICD-10-CM

## 2014-04-10 DIAGNOSIS — E559 Vitamin D deficiency, unspecified: Secondary | ICD-10-CM

## 2014-04-10 DIAGNOSIS — E785 Hyperlipidemia, unspecified: Secondary | ICD-10-CM

## 2014-04-10 DIAGNOSIS — D72819 Decreased white blood cell count, unspecified: Secondary | ICD-10-CM

## 2014-04-10 NOTE — Patient Instructions (Addendum)
F/u in 6 month, call if you need me before  Vitamin D level is again reduced and below desired level. Since you have thinning of the bones, I recommend resuming weekly vit D3, and this is sent to your pharmacy  Your "bad cholesterol" , for the first time, is above 100, it is 107, I recommend specifically attempting to increase vegetable and fruit intake, fresh or frozen, aim for this to be about 60 % of daily intake , and reduce fried and fatty foods, cheese , butter and red meat , use beans , egg white and lean white meat baked or grilled in appropriate portions, to hel[p with lowering the cholesterol Your total and good cholesterol, also triglycerides are excellent, so overall doing very  well, just best to reduce LDL.  Labs prior to f/u are CBC , stat, fasting lipid, mp, TSH and Vit D

## 2014-04-15 NOTE — Assessment & Plan Note (Signed)
Unchnaged, due to pt forgetting to take nmedication daily will send in weekly med

## 2014-04-16 ENCOUNTER — Encounter: Payer: Self-pay | Admitting: Family Medicine

## 2014-04-16 DIAGNOSIS — E785 Hyperlipidemia, unspecified: Secondary | ICD-10-CM | POA: Insufficient documentation

## 2014-04-16 MED ORDER — VITAMIN D3 1.25 MG (50000 UT) PO CAPS: 50000.0000 [IU] | ORAL_CAPSULE | ORAL | Status: DC

## 2014-04-16 NOTE — Assessment & Plan Note (Signed)
No recent flares, no longer seeing chiropracter, occasional use of otc naproxen

## 2014-04-16 NOTE — Progress Notes (Signed)
   Subjective:    Patient ID: Holly Murphy, female    DOB: 12-28-1960, 53 y.o.   MRN: [id number]  HPI The PT is here for follow up and re-evaluation of chronic medical conditions, medication management and review of any available recent lab and radiology data.  Preventive health is updated, specifically  Cancer screening and Immunization.   . The PT denies any adverse reactions to current medications since the last visit.  There are no new concerns.  There are no specific complaints       Review of Systems See HPI  Denies recent fever or chills. Denies sinus pressure, nasal congestion, ear pain or sore throat. Denies chest congestion, productive cough or wheezing. Denies chest pains, palpitations and leg swelling Denies abdominal pain, nausea, vomiting,diarrhea or constipation.   Denies dysuria, frequency, hesitancy or incontinence. Denies joint pain, swelling and limitation in mobility. Denies headaches, seizures, numbness, or tingling. Denies depression, anxiety or insomnia. Denies skin break down or rash.        Objective:   Physical Exam  BP 110/74  Pulse 81  Resp 16  Ht 5\' 3"  (1.6 m)  Wt 134 lb 6.4 oz (60.963 kg)  BMI 23.81 kg/m2  SpO2 100% Patient alert and oriented and in no cardiopulmonary distress.  HEENT: No facial asymmetry, EOMI,   oropharynx pink and moist.  Neck supple no JVD, no mass.  Chest: Clear to auscultation bilaterally.  CVS: S1, S2 no murmurs, no S3.Regular rate.  ABD: Soft non tender.   Ext: No edema  MS: Adequate ROM spine, shoulders, hips and knees.  Skin: Intact, no ulcerations or rash noted.  Psych: Good eye contact, normal affect. Memory intact not anxious or depressed appearing.  CNS: CN 2-12 intact, power,  normal throughout.no focal deficits noted.       Assessment & Plan:  Vitamin D deficiency Unchnaged, due to pt forgetting to take nmedication daily will send in weekly med  Leukopenia rept cbc stat in 6  month, has had full hematology eval in the past , no significant pathology  Dyslipidemia, goal LDL below 100 Pt needs to focus on lowering fried and fatty food  Repeat lab in 6 months  Backache No recent flares, no longer seeing chiropracter, occasional use of otc naproxen

## 2014-04-16 NOTE — Assessment & Plan Note (Signed)
rept cbc stat in 6 month, has had full hematology eval in the past , no significant pathology

## 2014-04-16 NOTE — Addendum Note (Signed)
Addended by: Eual Fines on: 04/16/2014 08:25 AM   Modules accepted: Orders

## 2014-04-16 NOTE — Assessment & Plan Note (Signed)
Pt needs to focus on lowering fried and fatty food  Repeat lab in 6 months

## 2014-04-19 ENCOUNTER — Telehealth (HOSPITAL_COMMUNITY): Payer: Self-pay | Admitting: Oncology

## 2014-04-19 NOTE — Telephone Encounter (Signed)
Per Holly Murphy's request I entered her account to obtain an itemized bill for a drs visit.

## 2014-08-13 ENCOUNTER — Other Ambulatory Visit: Payer: Self-pay | Admitting: Family Medicine

## 2014-08-13 DIAGNOSIS — Z1231 Encounter for screening mammogram for malignant neoplasm of breast: Secondary | ICD-10-CM

## 2014-08-16 ENCOUNTER — Ambulatory Visit (HOSPITAL_COMMUNITY)
Admission: RE | Admit: 2014-08-16 | Discharge: 2014-08-16 | Disposition: A | Discharge status: Home / Self Care | Payer: 59 | Source: Ambulatory Visit | Attending: Family Medicine | Admitting: Family Medicine

## 2014-08-16 DIAGNOSIS — Z1231 Encounter for screening mammogram for malignant neoplasm of breast: Secondary | ICD-10-CM | POA: Diagnosis not present

## 2014-08-16 IMAGING — MG MM DIGITAL SCREENING
3 series · 3 of 3 positions shown · non-contrast
Comparison: Previous exam(s).

CLINICAL DATA: Screening.

EXAM:
DIGITAL SCREENING BILATERAL MAMMOGRAM WITH CAD

[L CC]
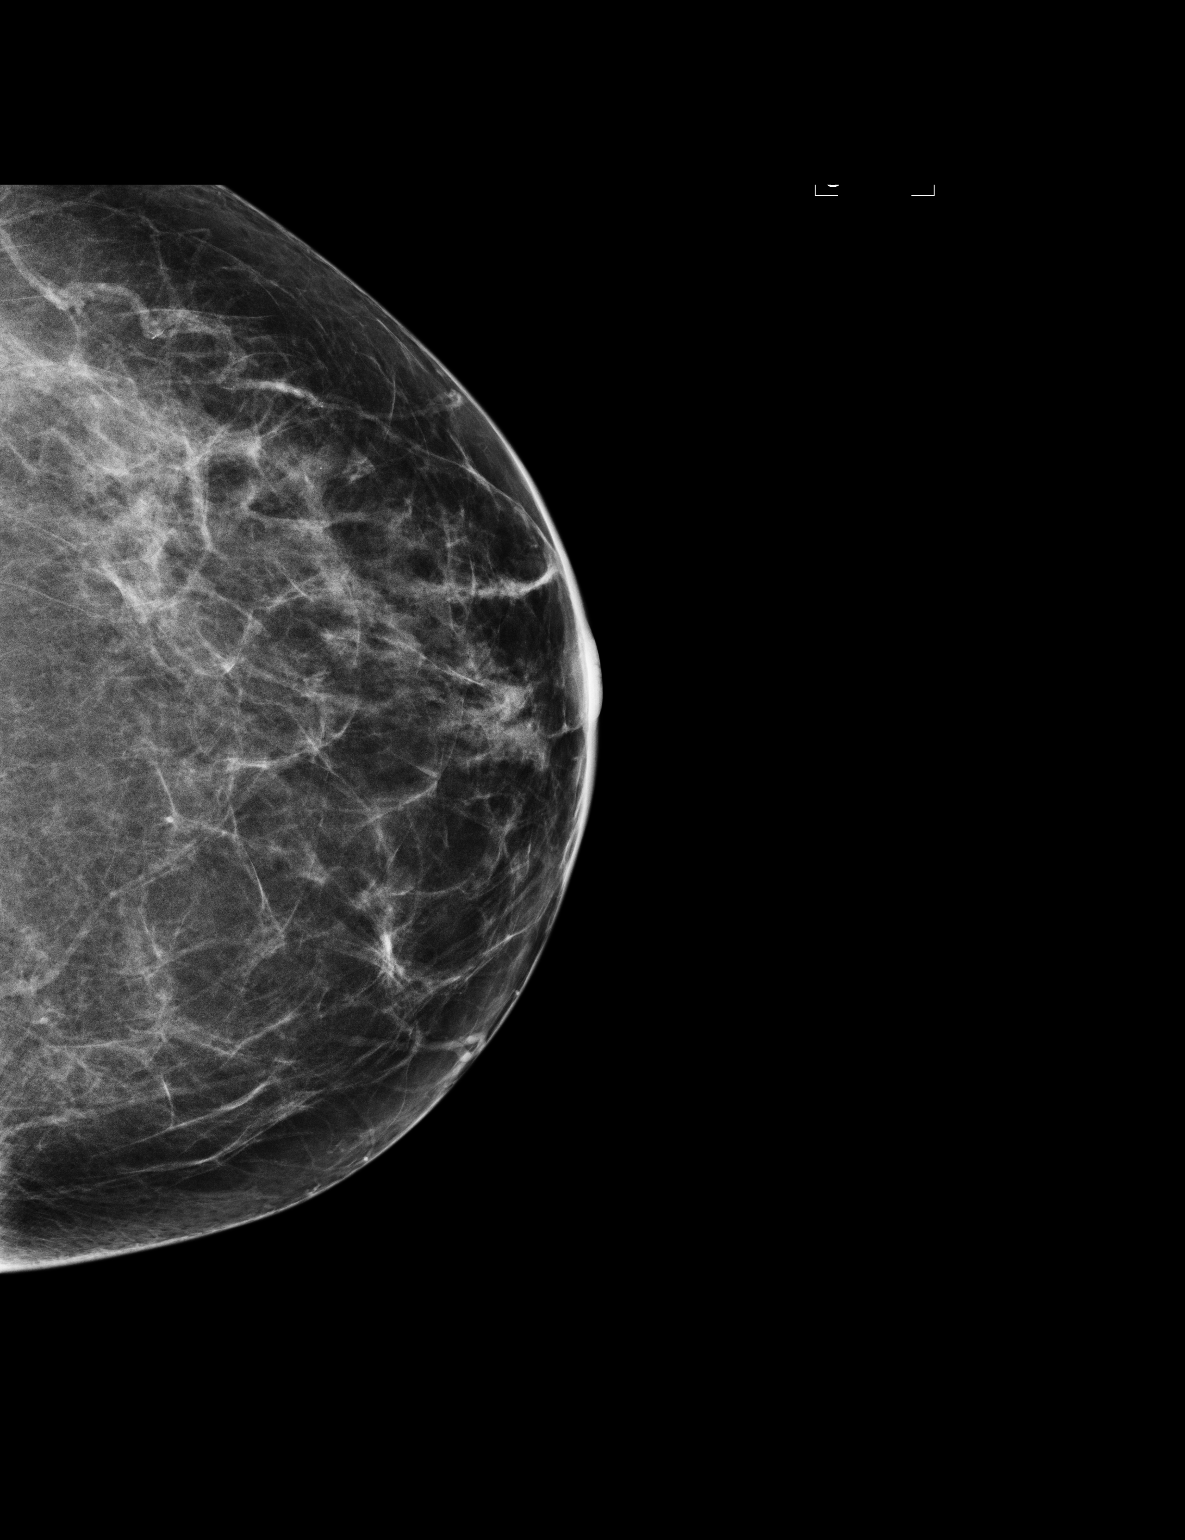

[L MLO]
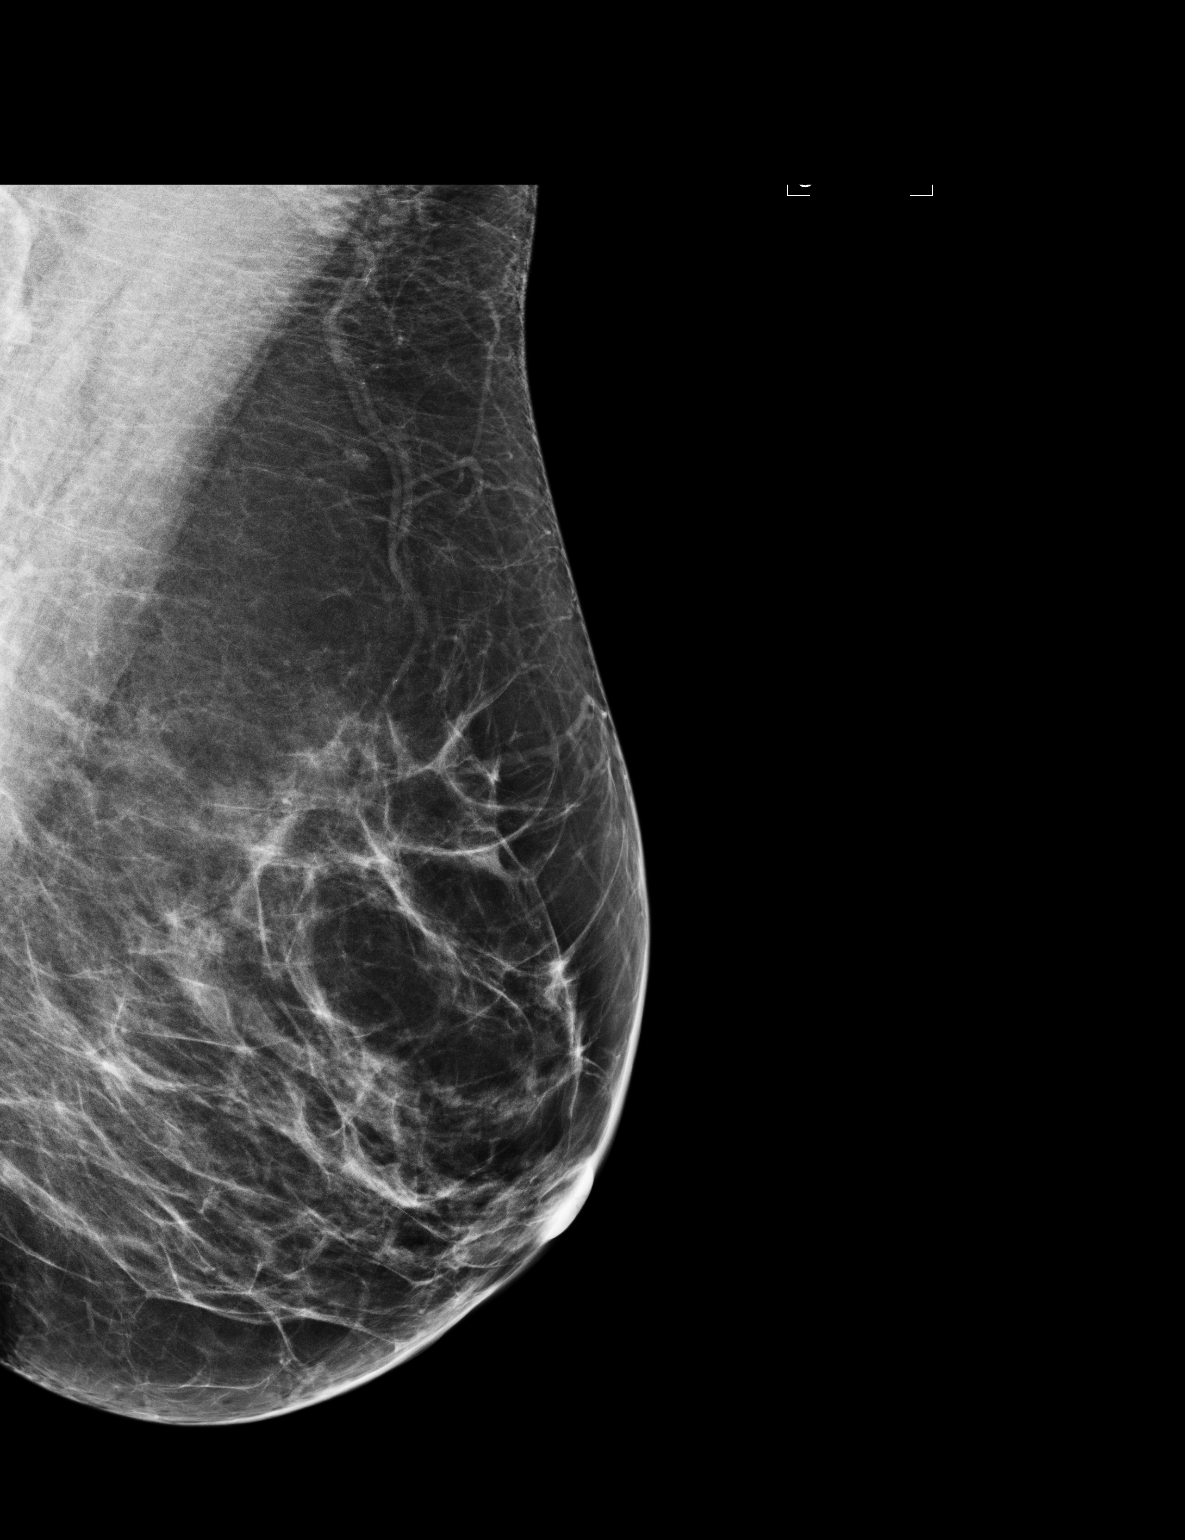

[R CC]
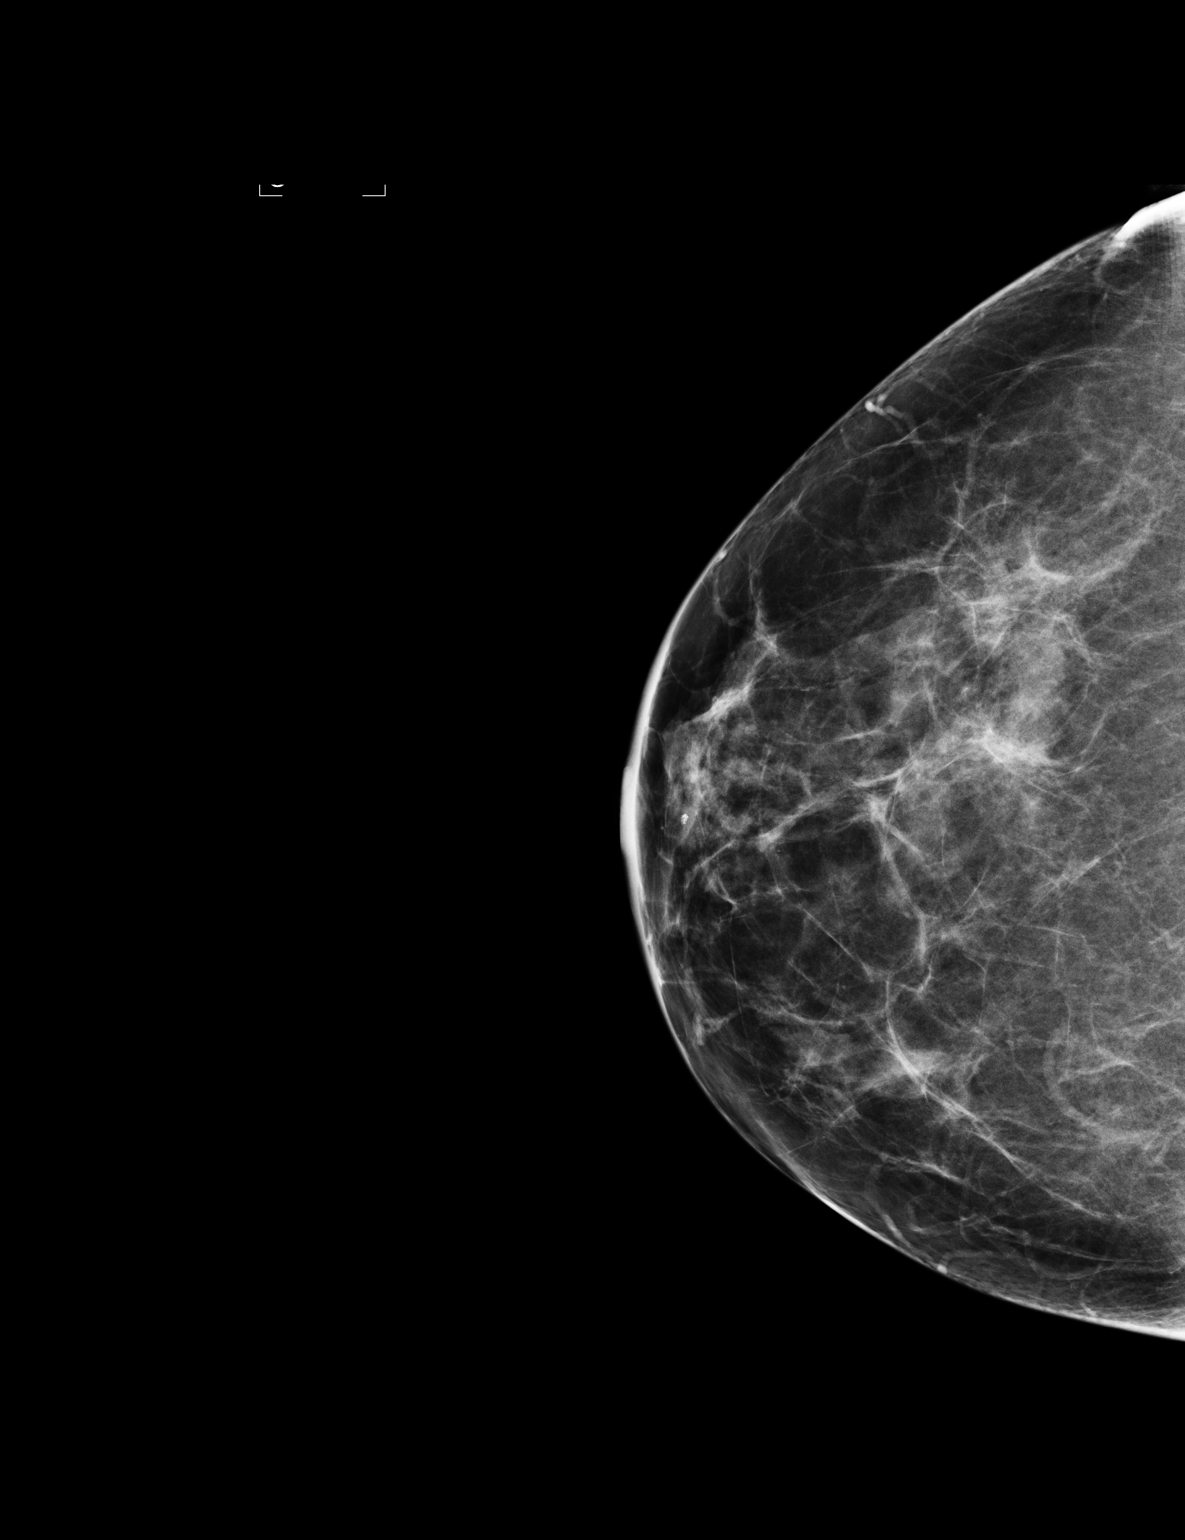

[3 of 3 positions shown; findings below may reference images not displayed]

ACR Breast Density Category b: There are scattered areas of
fibroglandular density.
FINDINGS: There are no findings suspicious for malignancy. Images were
processed with CAD.
IMPRESSION: No mammographic evidence of malignancy. A result letter of this
screening mammogram will be mailed directly to the patient.

RECOMMENDATION:
Screening mammogram in one year. (Code:[US])

BI-RADS CATEGORY  1: Negative.

## 2014-10-16 ENCOUNTER — Ambulatory Visit: Payer: 59 | Admitting: Family Medicine

## 2014-11-01 ENCOUNTER — Other Ambulatory Visit: Payer: Self-pay | Admitting: Family Medicine

## 2014-11-01 LAB — CBC WITH DIFFERENTIAL/PLATELET
BASOS PCT: 1 % (ref 0–1)
Basophils Absolute: 0 10*3/uL (ref 0.0–0.1)
EOS ABS: 0.1 10*3/uL (ref 0.0–0.7)
Eosinophils Relative: 2 % (ref 0–5)
HEMATOCRIT: 41.7 % (ref 36.0–46.0)
HEMOGLOBIN: 14.1 g/dL (ref 12.0–15.0)
LYMPHS ABS: 1.4 10*3/uL (ref 0.7–4.0)
Lymphocytes Relative: 44 % (ref 12–46)
MCH: 31.5 pg (ref 26.0–34.0)
MCHC: 33.8 g/dL (ref 30.0–36.0)
MCV: 93.1 fL (ref 78.0–100.0)
MONO ABS: 0.3 10*3/uL (ref 0.1–1.0)
MPV: 10 fL (ref 8.6–12.4)
Monocytes Relative: 10 % (ref 3–12)
Neutro Abs: 1.3 10*3/uL — ABNORMAL LOW (ref 1.7–7.7)
Neutrophils Relative %: 43 % (ref 43–77)
Platelets: 292 10*3/uL (ref 150–400)
RBC: 4.48 MIL/uL (ref 3.87–5.11)
RDW: 13.6 % (ref 11.5–15.5)
WBC: 3.1 10*3/uL — ABNORMAL LOW (ref 4.0–10.5)

## 2014-11-02 ENCOUNTER — Encounter: Payer: Self-pay | Admitting: Family Medicine

## 2014-11-02 LAB — COMPREHENSIVE METABOLIC PANEL
ALT: 9 U/L (ref 0–35)
AST: 21 U/L (ref 0–37)
Albumin: 4.3 g/dL (ref 3.5–5.2)
Alkaline Phosphatase: 46 U/L (ref 39–117)
BUN: 14 mg/dL (ref 6–23)
CO2: 24 mEq/L (ref 19–32)
Calcium: 9.5 mg/dL (ref 8.4–10.5)
Chloride: 105 mEq/L (ref 96–112)
Creat: 0.7 mg/dL (ref 0.50–1.10)
Glucose, Bld: 73 mg/dL (ref 70–99)
Potassium: 4.1 mEq/L (ref 3.5–5.3)
Sodium: 140 mEq/L (ref 135–145)
Total Bilirubin: 0.4 mg/dL (ref 0.2–1.2)
Total Protein: 6.9 g/dL (ref 6.0–8.3)

## 2014-11-02 LAB — TSH: TSH: 1.086 u[IU]/mL (ref 0.350–4.500)

## 2014-11-02 LAB — LIPID PANEL
Cholesterol: 178 mg/dL (ref 0–200)
HDL: 79 mg/dL (ref >=46)
LDL Cholesterol: 89 mg/dL (ref 0–99)
Total CHOL/HDL Ratio: 2.3 Ratio
Triglycerides: 48 mg/dL (ref <150)
VLDL: 10 mg/dL (ref 0–40)

## 2014-11-02 LAB — VITAMIN D 25 HYDROXY (VIT D DEFICIENCY, FRACTURES): VIT D 25 HYDROXY: 56 ng/mL (ref 30–100)

## 2014-11-12 ENCOUNTER — Ambulatory Visit (INDEPENDENT_AMBULATORY_CARE_PROVIDER_SITE_OTHER): Payer: 59 | Admitting: Family Medicine

## 2014-11-12 ENCOUNTER — Encounter: Payer: Self-pay | Admitting: Family Medicine

## 2014-11-12 VITALS — BP 118/78 | HR 78 | Resp 16 | Ht 63.0 in | Wt 134.0 lb

## 2014-11-12 DIAGNOSIS — Z1211 Encounter for screening for malignant neoplasm of colon: Secondary | ICD-10-CM

## 2014-11-12 DIAGNOSIS — M8580 Other specified disorders of bone density and structure, unspecified site: Secondary | ICD-10-CM | POA: Diagnosis not present

## 2014-11-12 DIAGNOSIS — E785 Hyperlipidemia, unspecified: Secondary | ICD-10-CM

## 2014-11-12 DIAGNOSIS — D72819 Decreased white blood cell count, unspecified: Secondary | ICD-10-CM | POA: Diagnosis not present

## 2014-11-12 DIAGNOSIS — M5489 Other dorsalgia: Secondary | ICD-10-CM

## 2014-11-12 DIAGNOSIS — E559 Vitamin D deficiency, unspecified: Secondary | ICD-10-CM | POA: Diagnosis not present

## 2014-11-12 LAB — POC HEMOCCULT BLD/STL (OFFICE/1-CARD/DIAGNOSTIC): FECAL OCCULT BLD: NEGATIVE

## 2014-11-12 NOTE — Progress Notes (Signed)
   Subjective:    Patient ID: Joice Lofts, female    DOB: 1961-07-05, 54 y.o.   MRN: [id number]  HPI The PT is here for follow up and re-evaluation of chronic medical conditions, medication management and review of any available recent lab and radiology data.  Preventive health is updated, specifically  Cancer screening and Immunization.   . Maintains a healthy lifestyle as far as diet and physical activity are concerned There are no new concerns.  There are no specific complaints      Review of Systems See HPI Denies recent fever or chills. Denies sinus pressure, nasal congestion, ear pain or sore throat. Denies chest congestion, productive cough or wheezing. Denies chest pains, palpitations and leg swelling Denies abdominal pain, nausea, vomiting,diarrhea or constipation.   Denies dysuria, frequency, hesitancy or incontinence. Denies joint pain, swelling and limitation in mobility. Denies headaches, seizures, numbness, or tingling. Denies depression, anxiety or insomnia. Denies skin break down or rash.        Objective:   Physical Exam BP 118/78 mmHg  Pulse 78  Resp 16  Ht 5\' 3"  (1.6 m)  Wt 134 lb (60.782 kg)  BMI 23.74 kg/m2  SpO2 100% Patient alert and oriented and in no cardiopulmonary distress.  HEENT: No facial asymmetry, EOMI,   oropharynx pink and moist.  Neck supple no JVD, no mass.  Chest: Clear to auscultation bilaterally.  CVS: S1, S2 no murmurs, no S3.Regular rate.  ABD: Soft non tender.no organomegaly or mass Rectal : no mass, heme negative stool   Ext: No edema  MS: Adequate ROM spine, shoulders, hips and knees.  Skin: Intact, no ulcerations or rash noted.  Psych: Good eye contact, normal affect. Memory intact not anxious or depressed appearing.  CNS: CN 2-12 intact, power,  normal throughout.no focal deficits noted.        Assessment & Plan:

## 2014-11-12 NOTE — Patient Instructions (Signed)
F/u with rectal in 1 years, call if you need me before  Excellent labs, keep doing what you are doing!  Call if you decide you need sleep evaluation, ask about ongoing snoring and monotr possible fatigue  CBc, fasting lipid, chem 7, TSH and vit D in 12 months, call for lab sheet 1 month before appt please  Rectal exam today is normal, and you have no hidden blood in stool  Thanks for choosing [facility], we consider it a privelige to serve you.

## 2014-11-30 NOTE — Assessment & Plan Note (Signed)
No significant change in WBC, recently had full hematology eval

## 2014-11-30 NOTE — Assessment & Plan Note (Signed)
Pt has osteopenia, she is encouraged to continue supplements

## 2014-11-30 NOTE — Assessment & Plan Note (Signed)
Corrected with dietary change, pt applauded on this , will re evaluate in 12 months

## 2014-11-30 NOTE — Assessment & Plan Note (Signed)
Weight bearing activity and adequate calcium and D intake encouraged, will not rept dexa for an additional 1 to 2 years

## 2014-11-30 NOTE — Assessment & Plan Note (Signed)
Not a significant complaint currently, uses OTC med for relief periodically

## 2015-06-18 ENCOUNTER — Other Ambulatory Visit: Payer: Self-pay | Admitting: Family Medicine

## 2015-06-18 DIAGNOSIS — Z1231 Encounter for screening mammogram for malignant neoplasm of breast: Secondary | ICD-10-CM

## 2015-06-24 ENCOUNTER — Encounter: Payer: Self-pay | Admitting: Family Medicine

## 2015-06-24 DIAGNOSIS — H9313 Tinnitus, bilateral: Secondary | ICD-10-CM

## 2015-06-25 NOTE — Telephone Encounter (Signed)
Referral entered to ent with preference to be seen the last week in December

## 2015-07-25 ENCOUNTER — Ambulatory Visit (INDEPENDENT_AMBULATORY_CARE_PROVIDER_SITE_OTHER): Payer: 59 | Admitting: Otolaryngology

## 2015-07-25 DIAGNOSIS — H9313 Tinnitus, bilateral: Secondary | ICD-10-CM

## 2015-07-25 DIAGNOSIS — H6123 Impacted cerumen, bilateral: Secondary | ICD-10-CM

## 2015-07-25 DIAGNOSIS — H903 Sensorineural hearing loss, bilateral: Secondary | ICD-10-CM | POA: Diagnosis not present

## 2015-07-26 ENCOUNTER — Encounter: Payer: Self-pay | Admitting: Adult Health

## 2015-07-26 ENCOUNTER — Ambulatory Visit (INDEPENDENT_AMBULATORY_CARE_PROVIDER_SITE_OTHER): Payer: 59 | Admitting: Adult Health

## 2015-07-26 ENCOUNTER — Other Ambulatory Visit (HOSPITAL_COMMUNITY)
Admission: RE | Admit: 2015-07-26 | Discharge: 2015-07-26 | Disposition: A | Discharge status: Home / Self Care | Payer: 59 | Source: Ambulatory Visit | Attending: Adult Health | Admitting: Adult Health

## 2015-07-26 VITALS — BP 130/80 | HR 68 | Ht 62.5 in | Wt 136.5 lb

## 2015-07-26 DIAGNOSIS — Z1151 Encounter for screening for human papillomavirus (HPV): Secondary | ICD-10-CM | POA: Diagnosis not present

## 2015-07-26 DIAGNOSIS — Z1212 Encounter for screening for malignant neoplasm of rectum: Secondary | ICD-10-CM | POA: Diagnosis not present

## 2015-07-26 DIAGNOSIS — L900 Lichen sclerosus et atrophicus: Secondary | ICD-10-CM | POA: Insufficient documentation

## 2015-07-26 DIAGNOSIS — N952 Postmenopausal atrophic vaginitis: Secondary | ICD-10-CM | POA: Insufficient documentation

## 2015-07-26 DIAGNOSIS — Z01419 Encounter for gynecological examination (general) (routine) without abnormal findings: Secondary | ICD-10-CM

## 2015-07-26 HISTORY — DX: Postmenopausal atrophic vaginitis: N95.2

## 2015-07-26 HISTORY — DX: Lichen sclerosus et atrophicus: L90.0

## 2015-07-26 LAB — HEMOCCULT GUIAC POC 1CARD (OFFICE): FECAL OCCULT BLD: NEGATIVE

## 2015-07-26 MED ORDER — ESTRADIOL 0.1 MG/GM VA CREA: 1.0000 | TOPICAL_CREAM | Freq: Every day | VAGINAL | Status: DC

## 2015-07-26 MED ORDER — CLOBETASOL PROPIONATE 0.05 % EX CREA: 1.0000 "application " | TOPICAL_CREAM | Freq: Two times a day (BID) | CUTANEOUS | Status: DC

## 2015-07-26 NOTE — Progress Notes (Signed)
Patient ID: Holly Murphy, female   DOB: Jan 20, 1961, 55 y.o.   MRN: [id number] History of Present Illness: Holly Murphy is a 55 year old white female, married, in for a well woman gyn exam and pap.Has hear loss and saw Dr Benjamine Mola.  PCP is Dr Moshe Cipro.She got flu shot at work.   Current Medications, Allergies, Past Medical History, Past Surgical History, Family History and Social History were reviewed in [employer] record.     Review of Systems: Patient denies any headaches, hearing loss, fatigue, blurred vision, shortness of breath, chest pain, abdominal pain, problems with bowel movements, urination, or intercourse(pain at times). No joint pain or mood swings.She is postmenopausal.   Physical Exam:BP 130/80 mmHg  Pulse 68  Ht 5' 2.5" (1.588 m)  Wt 136 lb 8 oz (61.916 kg)  BMI 24.55 kg/m2 General:  Well developed, well nourished, no acute distress Skin:  Warm and dry Neck:  Midline trachea, normal thyroid, good ROM, no lymphadenopathy Lungs; Clear to auscultation bilaterally Breast:  No dominant palpable mass, retraction, or nipple discharge Cardiovascular: Regular rate and rhythm Abdomen:  Soft, non tender, no hepatosplenomegaly Pelvic:  External genitalia has some color changes on labia, white and thin and then petechiae at introitus and thin tissue, looks like lichen sclerosus, has had in past.  The vagina has decreased color, moisture and rugae.Marland Kitchen Urethra has no lesions or masses. The cervix is smooth, pap with HPV performed.  Uterus is felt to be normal size, shape, and contour.  No adnexal masses or tenderness noted.Bladder is non tender, no masses felt. Rectal: Good sphincter tone, no polyps, or hemorrhoids felt.  Hemoccult negative. Extremities/musculoskeletal:  No swelling or varicosities noted, no clubbing or cyanosis Psych:  No mood changes, alert and cooperative,seems happy Discussed LSA and that is chronic and if not better after temovate may biopsy.    Impression: Well woman gyn exam and pap LSA  Vaginal atrophy    Plan: Given 4 tubes estrace vaginal cream to use on vulva and introitus daily for 2 weeks then 2-3 x weekly, lot# [id number] exp 4/19 Rx temovate 0.05% cream to use bid with 2 refills Follow up in 4 weeks, will get Dr Elonda Husky to co exam then if needed Physical in 1 year Pap in 3 if normal Mammogram yearly Labs with PCP  Colonoscopy per GI Review handout on LSA

## 2015-07-26 NOTE — Patient Instructions (Signed)
Mammogram 2/6 Use temovate bid Estrace daily for 2 weeks then 2-3 x week Follow up in 4 weeks Labs with PCP Physical in 1 year pap in 3 if normal Lichen Sclerosus Lichen sclerosus is a skin problem. It can happen on any part of the body, but it commonly involves the anal or genital areas. It can cause itching and discomfort in these areas. Treatment can help to control symptoms. When the genital area is affected, getting treatment is important because the condition can cause scarring that may lead to other problems. CAUSES The cause of this condition is not known. It could be the result of an overactive immune system or a lack of certain hormones. Lichen sclerosus is not an infection or a fungus. It is not passed from one person to another (not contagious). RISK FACTORS This condition is more likely to develop in women, usually after menopause. SYMPTOMS Symptoms of this condition include:  Thin, wrinkled, white areas on the skin.  Thickened white areas on the skin.  Red and swollen patches (lesions) on the skin.  Tears or cracks in the skin.  Bruising.  Blood blisters.  Severe itching. You may also have pain, itching, or burning with urination. Constipation is also common in people with lichen sclerosus. DIAGNOSIS This condition may be diagnosed with a physical exam. In some cases, a tissue sample (biopsy sample) may be removed to be looked at under a microscope. TREATMENT This condition is usually treated with medicated creams or ointments (topical steroids) that are applied over the affected areas. HOME CARE INSTRUCTIONS  Take over-the-counter and prescription medicines only as told by your health care provider.  Use creams or ointments as told by your health care provider.  Do not scratch the affected areas of skin.  Women should keep the vaginal area as clean and dry as possible.  Keep all follow-up visits as told by your health care provider. This is important. SEEK  MEDICAL CARE IF:  You have increasing redness, swelling, or pain in the affected area.  You have fluid, blood, or pus coming from the affected area.  You have new lesions on your skin.  You have pain or burning with urination.  You have pain during sex.   This information is not intended to replace advice given to you by your health care provider. Make sure you discuss any questions you have with your health care provider.   Document Released: 11/19/2010 Document Revised: 03/20/2015 Document Reviewed: 09/24/2014 Elsevier Interactive Patient Education [id number] [employer] Lichen Planus Lichen planus is a skin problem that causes redness, itching, small bumps, and sores. It can affect the skin in any area of the body. Some common areas affected include:  Arms, wrists, legs, or ankles.  Chest, back, or abdomen.  Genital areas such as the vulva and vagina.  Gums and inside of the mouth.  Scalp.  Fingernails or toenails. Treatment can help control the symptoms of this condition. The condition can last for a long time. It can take 6-18 months for it to go away. CAUSES The exact cause of this condition is not known. The condition is not passed from one person to another (not contagious). It may be related to an allergy or an autoimmune response. An autoimmune response occurs when the body's defense system (immune system) mistakenly attacks healthy tissues. RISK FACTORS This condition is more likely to develop in:  People who are older than 53 years of age.  People who take certain medicines.  People who  have been exposed to certain dyes or chemicals.  People with hepatitis C. SYMPTOMS Symptoms of this condition may include:  Itching, which can be severe.  Small reddish or purple bumps on the skin. These may have flat tops and may be round or irregular shaped.  Redness or white patches on the gums or tongue.  Redness, soreness, or a burning feeling in the genital area. This  may lead to pain or bleeding during sex.  Changes in the fingernails or toenails. The nails may become thin or rough. They may have ridges in them.  Redness or irritation of the eyes. This is rare. DIAGNOSIS This condition may be diagnosed based on:   A physical exam. The health care provider will examine your affected skin and check for changes inside your mouth.  Removal of a tissue sample (biopsy sample) to be looked at under a microscope. TREATMENT Treatment for this condition may depend on the severity of symptoms. In some cases, no treatment is needed. If treatment is needed to control symptoms, it may include:  Creams or ointments (topical steroids) to help control itching and irritation.  Medicine to be taken by mouth.  A treatment in which your skin is exposed to ultraviolet light (phototherapy).  Lozenges that you suck on to help treat sores in the mouth. HOME CARE INSTRUCTIONS  Take over-the-counter and prescription medicines only as told by your health care provider.  Use creams or ointments as told by your health care provider.  Do not scratch the affected areas of skin.  Women should keep the vaginal area as clean and dry as possible. SEEK MEDICAL CARE IF:  You have increasing redness, swelling, or pain in the affected area.  You have fluid, blood, or pus coming from the affected area.  Your eyes become irritated.   This information is not intended to replace advice given to you by your health care provider. Make sure you discuss any questions you have with your health care provider.   Document Released: 11/20/2010 Document Revised: 03/20/2015 Document Reviewed: 09/24/2014 Elsevier Interactive Patient Education [employer].

## 2015-07-29 ENCOUNTER — Telehealth: Payer: Self-pay | Admitting: Family Medicine

## 2015-07-29 ENCOUNTER — Other Ambulatory Visit: Payer: Self-pay | Admitting: Family Medicine

## 2015-07-29 ENCOUNTER — Encounter: Payer: Self-pay | Admitting: Family Medicine

## 2015-07-29 MED ORDER — AZITHROMYCIN 250 MG PO TABS: ORAL_TABLET | ORAL | Status: DC

## 2015-07-29 NOTE — Telephone Encounter (Signed)
Pls directly call pt in am , conflict in pharmacy, not sure where to send , som printed script. See her message she sent which I was trying to answer directly please

## 2015-07-30 NOTE — Telephone Encounter (Signed)
Sent to [facility] per pt 

## 2015-07-31 LAB — CYTOLOGY - PAP

## 2015-08-01 ENCOUNTER — Telehealth: Payer: Self-pay | Admitting: Adult Health

## 2015-08-01 NOTE — Telephone Encounter (Signed)
Mailbox full

## 2015-08-02 ENCOUNTER — Telehealth: Payer: Self-pay | Admitting: Adult Health

## 2015-08-02 NOTE — Telephone Encounter (Signed)
Left message to call.

## 2015-08-02 NOTE — Telephone Encounter (Signed)
Pt aware of pap unsat, but negative HPV,will repeat has scheduled

## 2015-08-19 ENCOUNTER — Ambulatory Visit (HOSPITAL_COMMUNITY)
Admission: RE | Admit: 2015-08-19 | Discharge: 2015-08-19 | Disposition: A | Discharge status: Home / Self Care | Payer: 59 | Source: Ambulatory Visit | Attending: Family Medicine | Admitting: Family Medicine

## 2015-08-19 DIAGNOSIS — Z1231 Encounter for screening mammogram for malignant neoplasm of breast: Secondary | ICD-10-CM | POA: Diagnosis not present

## 2015-08-19 IMAGING — MG MM DIGITAL SCREENING
4 series · 4 of 4 positions shown · non-contrast
Comparison: Previous exam(s).

CLINICAL DATA: Screening.

EXAM:
DIGITAL SCREENING BILATERAL MAMMOGRAM WITH CAD

[R MLO]
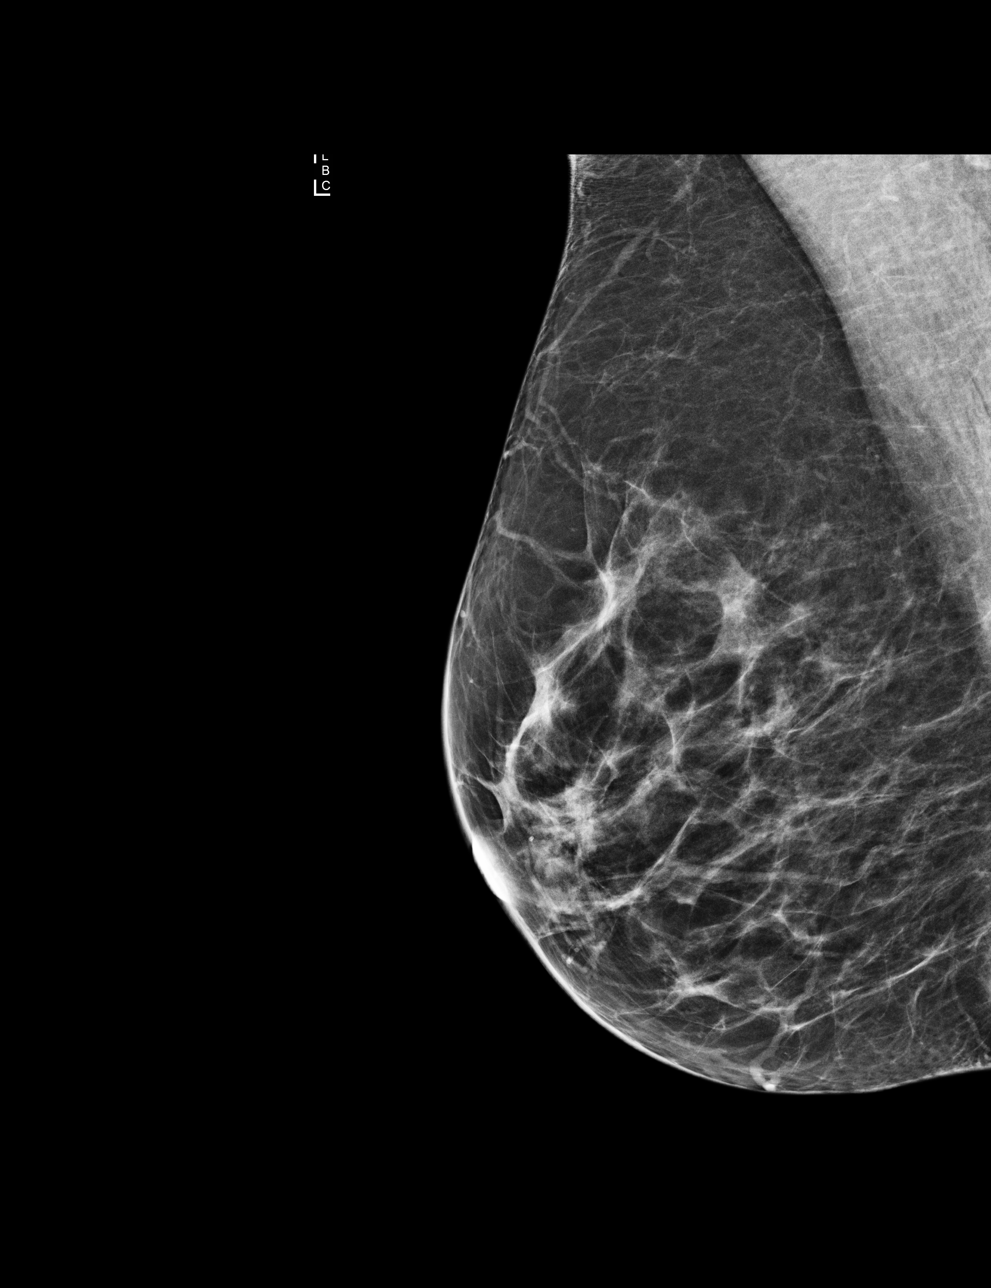

[L CC]
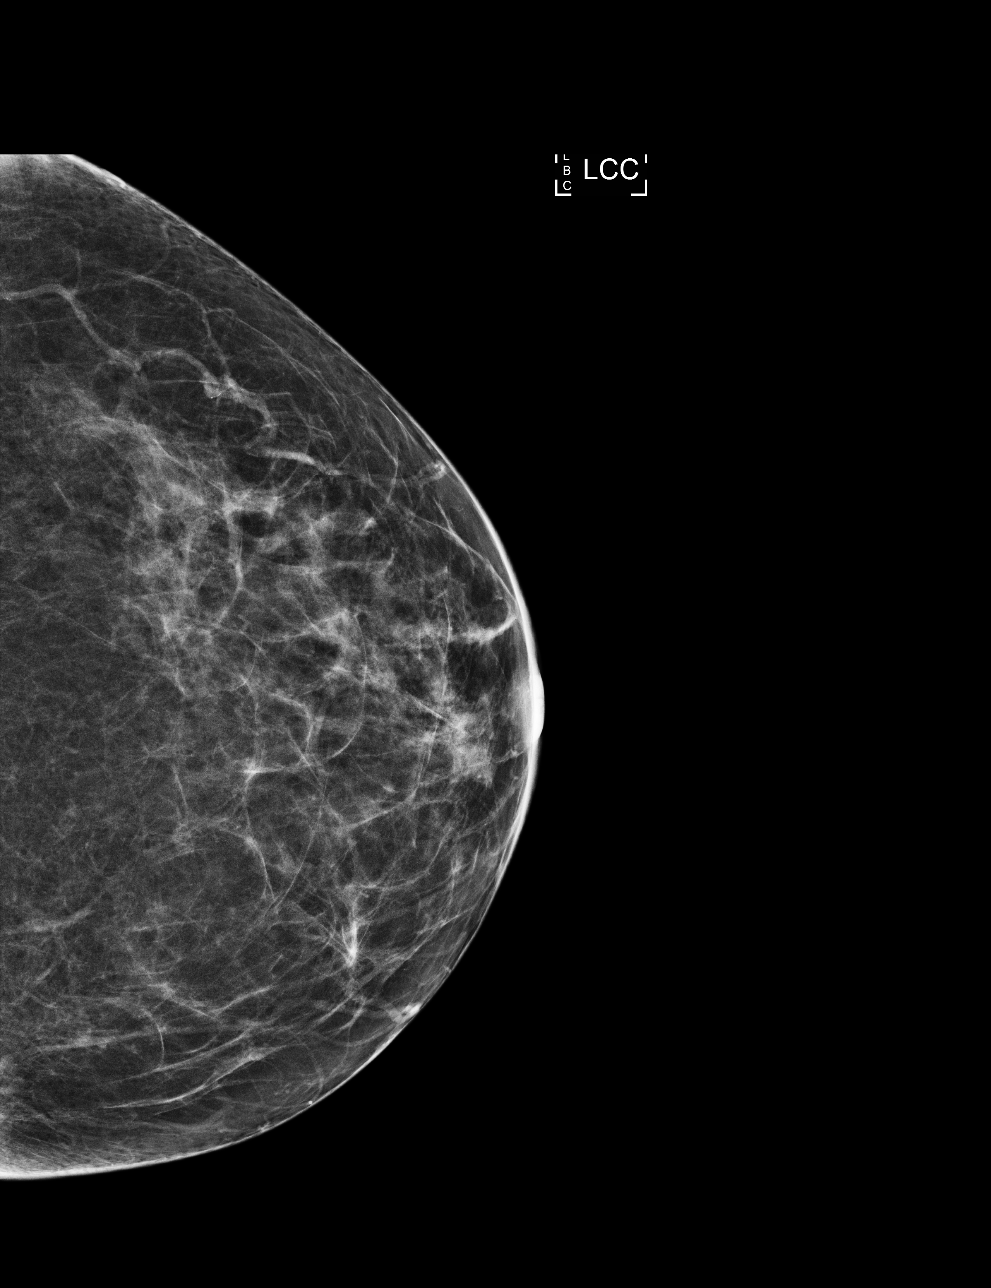

[R CC]
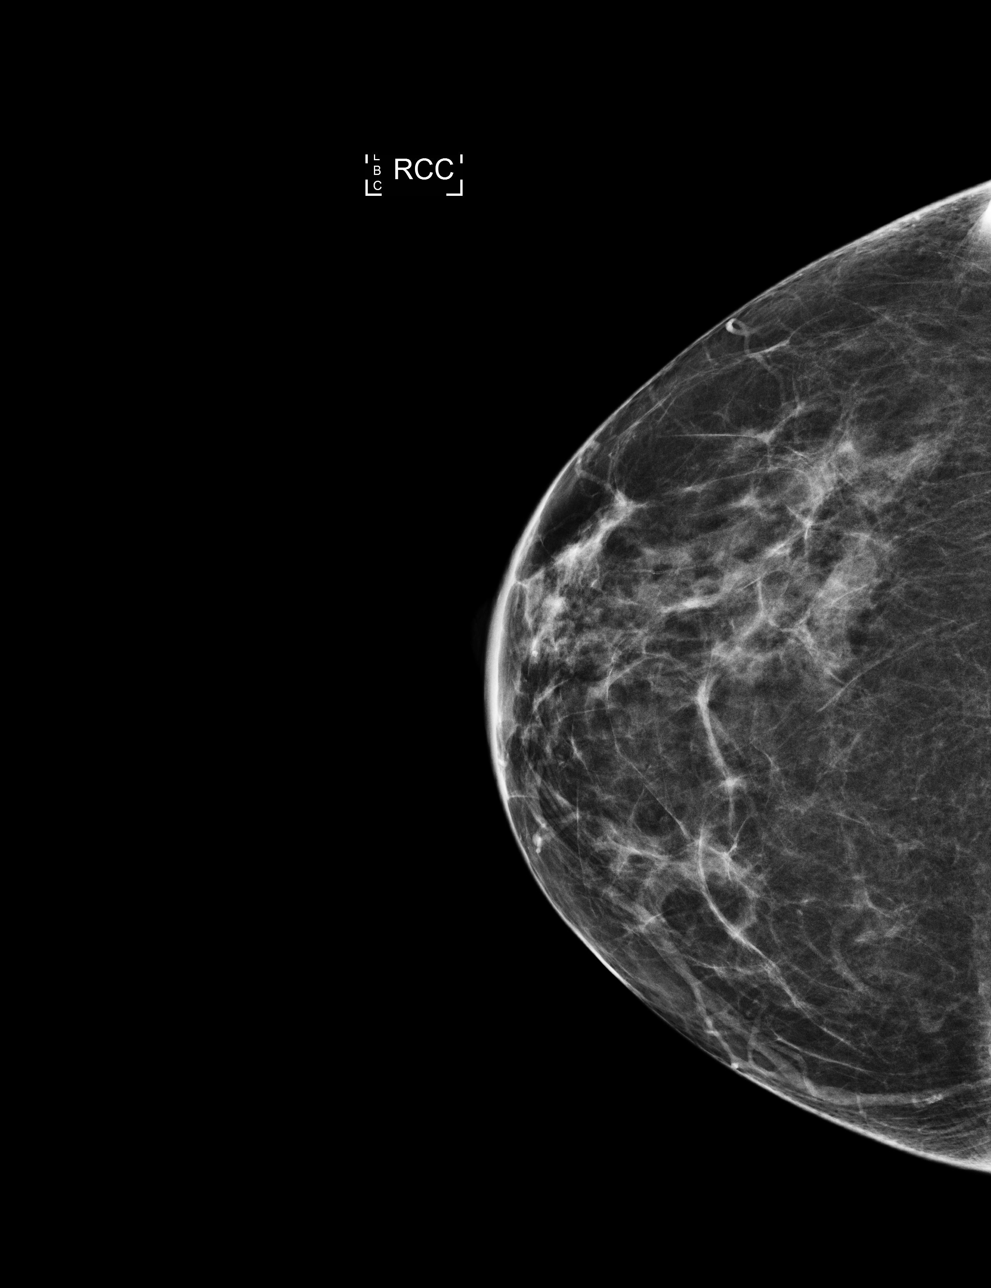

[L MLO]
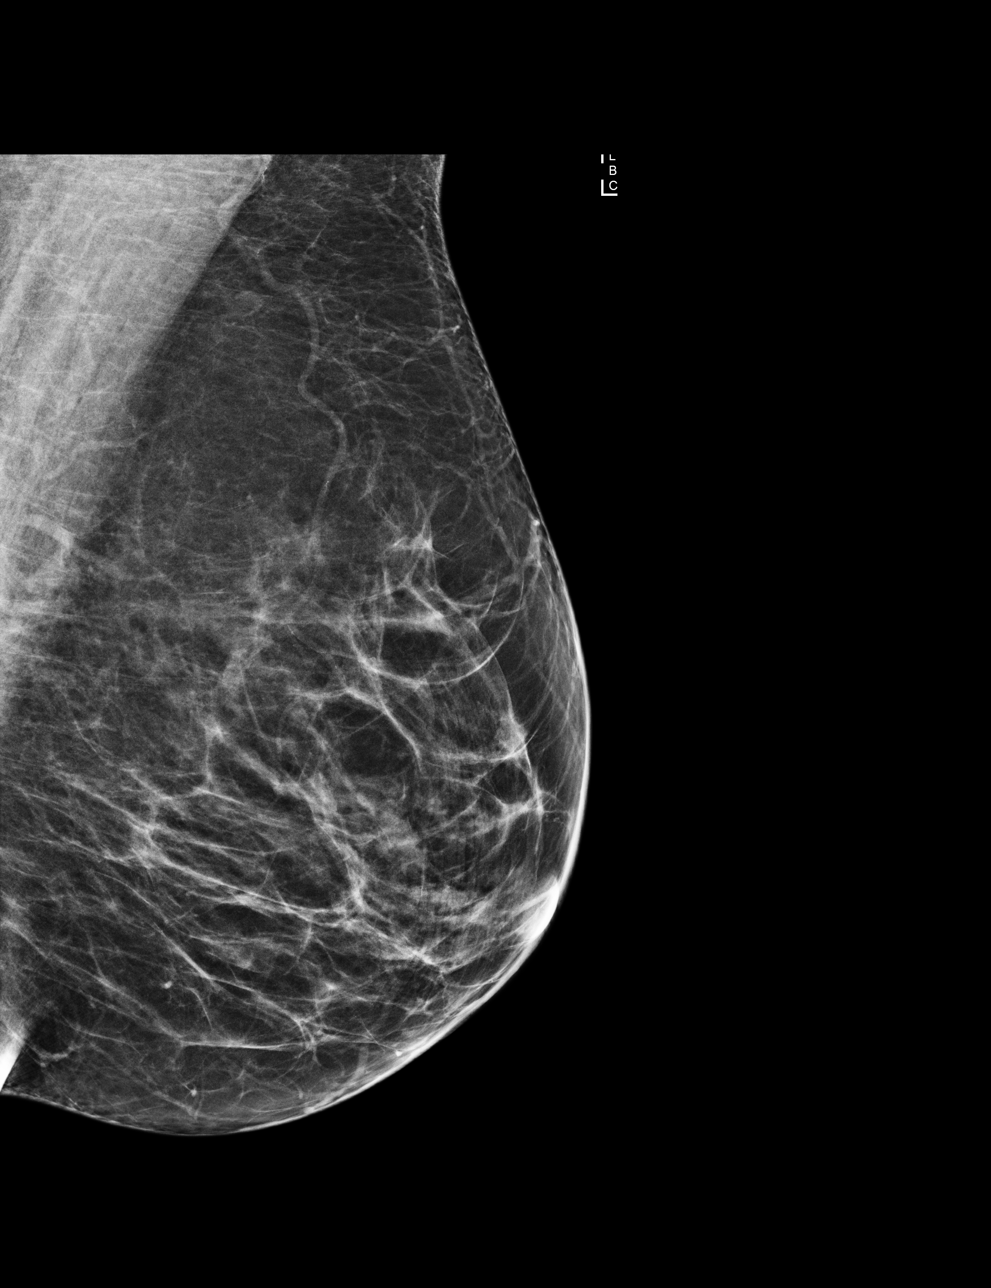

[4 of 4 positions shown; findings below may reference images not displayed]

ACR Breast Density Category b: There are scattered areas of
fibroglandular density.
FINDINGS: There are no findings suspicious for malignancy. Images were
processed with CAD.
IMPRESSION: No mammographic evidence of malignancy. A result letter of this
screening mammogram will be mailed directly to the patient.

RECOMMENDATION:
Screening mammogram in one year. (Code:[US])

BI-RADS CATEGORY  1: Negative.

## 2015-08-29 ENCOUNTER — Encounter: Payer: Self-pay | Admitting: Adult Health

## 2015-08-29 ENCOUNTER — Ambulatory Visit (INDEPENDENT_AMBULATORY_CARE_PROVIDER_SITE_OTHER): Payer: 59 | Admitting: Adult Health

## 2015-08-29 VITALS — BP 110/60 | HR 72 | Ht 62.5 in | Wt 138.5 lb

## 2015-08-29 DIAGNOSIS — N952 Postmenopausal atrophic vaginitis: Secondary | ICD-10-CM | POA: Diagnosis not present

## 2015-08-29 DIAGNOSIS — Z79890 Hormone replacement therapy (postmenopausal): Secondary | ICD-10-CM | POA: Diagnosis not present

## 2015-08-29 DIAGNOSIS — L900 Lichen sclerosus et atrophicus: Secondary | ICD-10-CM | POA: Diagnosis not present

## 2015-08-29 HISTORY — DX: Hormone replacement therapy: Z79.890

## 2015-08-29 MED ORDER — CONJ ESTROGENS-BAZEDOXIFENE 0.45-20 MG PO TABS: ORAL_TABLET | ORAL | Status: DC

## 2015-08-29 NOTE — Progress Notes (Signed)
Subjective:     Patient ID: Holly Murphy, female   DOB: 19-Jul-1960, 55 y.o.   MRN: [id number]  HPI Holly Murphy is a 55 year old white female married back in follow up of starting vaginal estrogen and temovate.She thinks it is better, but pulls with sex.  Review of Systems Patient denies any headaches, hearing loss, fatigue, blurred vision, shortness of breath, chest pain, abdominal pain, problems with bowel movements, urination, No joint pain or mood swings.See HPI for positives. Reviewed past medical,surgical, social and family history. Reviewed medications and allergies.      Objective:   Physical Exam BP 110/60 mmHg  Pulse 72  Ht 5' 2.5" (1.588 m)  Wt 138 lb 8 oz (62.823 kg)  BMI 24.91 kg/m2   Skin warm and dry, area at introitus is pink and looks much better, still has thin labia tissue and some paleness in tissues, Dr Elonda Husky for co exam, discussed HRT, and she will try, will stop estrogen vaginal cream and decrease temovate to 2 x weekly and will try [facility].Use lubricate with sex.  Face time 15 minutes with 50 % counseling. Assessment:     Vaginal atrophy LSA  HRT    Plan:    Continue temovate 2 x weekly  Given 56 tabs of Duavee to try take 1 daily lot [id number] exp 6/17. Will talk in about 6-8 weeks Follow up prn

## 2015-08-29 NOTE — Patient Instructions (Signed)
Stop estrogen cream  Start duavee 1 daily  Use temovate 2 x weekly  Follow up prn

## 2015-11-14 ENCOUNTER — Telehealth: Payer: Self-pay

## 2015-11-14 DIAGNOSIS — E785 Hyperlipidemia, unspecified: Secondary | ICD-10-CM

## 2015-11-14 DIAGNOSIS — E559 Vitamin D deficiency, unspecified: Secondary | ICD-10-CM

## 2015-11-14 NOTE — Telephone Encounter (Signed)
Lab order faxed to her

## 2015-11-15 ENCOUNTER — Other Ambulatory Visit (HOSPITAL_COMMUNITY)
Admission: RE | Admit: 2015-11-15 | Discharge: 2015-11-15 | Disposition: A | Discharge status: Home / Self Care | Payer: 59 | Source: Ambulatory Visit | Attending: Family Medicine | Admitting: Family Medicine

## 2015-11-15 ENCOUNTER — Telehealth: Payer: Self-pay | Admitting: Family Medicine

## 2015-11-15 DIAGNOSIS — E785 Hyperlipidemia, unspecified: Secondary | ICD-10-CM | POA: Insufficient documentation

## 2015-11-15 DIAGNOSIS — E559 Vitamin D deficiency, unspecified: Secondary | ICD-10-CM | POA: Diagnosis not present

## 2015-11-15 DIAGNOSIS — D72819 Decreased white blood cell count, unspecified: Secondary | ICD-10-CM

## 2015-11-15 LAB — LIPID PANEL
Cholesterol: 187 mg/dL (ref 0–200)
HDL: 80 mg/dL
LDL Cholesterol: 99 mg/dL (ref 0–99)
Total CHOL/HDL Ratio: 2.3 ratio
Triglycerides: 40 mg/dL
VLDL: 8 mg/dL (ref 0–40)

## 2015-11-15 LAB — COMPREHENSIVE METABOLIC PANEL
ALK PHOS: 47 U/L (ref 38–126)
ALT: 14 U/L (ref 14–54)
ANION GAP: 6 (ref 5–15)
AST: 17 U/L (ref 15–41)
Albumin: 4.2 g/dL (ref 3.5–5.0)
BILIRUBIN TOTAL: 0.5 mg/dL (ref 0.3–1.2)
BUN: 13 mg/dL (ref 6–20)
CALCIUM: 9 mg/dL (ref 8.9–10.3)
CO2: 27 mmol/L (ref 22–32)
Chloride: 108 mmol/L (ref 101–111)
Creatinine, Ser: 0.62 mg/dL (ref 0.44–1.00)
GFR calc Af Amer: >60 mL/min (ref >60)
GFR calc non Af Amer: >60 mL/min (ref >60)
GLUCOSE: 96 mg/dL (ref 65–99)
Potassium: 3.7 mmol/L (ref 3.5–5.1)
Sodium: 141 mmol/L (ref 135–145)
TOTAL PROTEIN: 6.8 g/dL (ref 6.5–8.1)

## 2015-11-15 LAB — CBC WITH DIFFERENTIAL/PLATELET
Basophils Absolute: 0 K/uL (ref 0.0–0.1)
Basophils Relative: 1 %
Eosinophils Absolute: 0.2 K/uL (ref 0.0–0.7)
Eosinophils Relative: 4 %
HCT: 40.2 % (ref 36.0–46.0)
Hemoglobin: 13.6 g/dL (ref 12.0–15.0)
Lymphocytes Relative: 42 %
Lymphs Abs: 1.5 K/uL (ref 0.7–4.0)
MCH: 31.8 pg (ref 26.0–34.0)
MCHC: 33.8 g/dL (ref 30.0–36.0)
MCV: 93.9 fL (ref 78.0–100.0)
Monocytes Absolute: 0.4 K/uL (ref 0.1–1.0)
Monocytes Relative: 10 %
Neutro Abs: 1.5 K/uL — ABNORMAL LOW (ref 1.7–7.7)
Neutrophils Relative %: 43 %
Platelets: 264 K/uL (ref 150–400)
RBC: 4.28 MIL/uL (ref 3.87–5.11)
RDW: 13.2 % (ref 11.5–15.5)
WBC: 3.5 K/uL — ABNORMAL LOW (ref 4.0–10.5)

## 2015-11-15 LAB — TSH: TSH: 1.438 u[IU]/mL (ref 0.350–4.500)

## 2015-11-15 NOTE — Telephone Encounter (Signed)
Cbc added to recently drawn labs.  Called and left voicemail for patient notifying.

## 2015-11-15 NOTE — Telephone Encounter (Signed)
Holly Murphy called in regards to her lab orders, she had labs drawn yesterday in the lab at [facility] and she is asking if a CBC needed to be added to that order, she states that Dr. Moshe Cipro normally draws a CBS

## 2015-11-16 LAB — VITAMIN D 25 HYDROXY (VIT D DEFICIENCY, FRACTURES): VIT D 25 HYDROXY: 17.6 ng/mL — AB (ref 30.0–100.0)

## 2015-11-18 ENCOUNTER — Ambulatory Visit (INDEPENDENT_AMBULATORY_CARE_PROVIDER_SITE_OTHER): Payer: 59 | Admitting: Family Medicine

## 2015-11-18 ENCOUNTER — Other Ambulatory Visit (HOSPITAL_COMMUNITY)
Admission: RE | Admit: 2015-11-18 | Discharge: 2015-11-18 | Disposition: A | Discharge status: Home / Self Care | Payer: 59 | Source: Ambulatory Visit | Attending: Family Medicine | Admitting: Family Medicine

## 2015-11-18 ENCOUNTER — Encounter: Payer: Self-pay | Admitting: Family Medicine

## 2015-11-18 VITALS — BP 118/74 | HR 88 | Resp 16 | Ht 63.0 in | Wt 137.1 lb

## 2015-11-18 DIAGNOSIS — L900 Lichen sclerosus et atrophicus: Secondary | ICD-10-CM

## 2015-11-18 DIAGNOSIS — D72819 Decreased white blood cell count, unspecified: Secondary | ICD-10-CM | POA: Diagnosis not present

## 2015-11-18 DIAGNOSIS — Z1159 Encounter for screening for other viral diseases: Secondary | ICD-10-CM

## 2015-11-18 DIAGNOSIS — F329 Major depressive disorder, single episode, unspecified: Secondary | ICD-10-CM | POA: Insufficient documentation

## 2015-11-18 DIAGNOSIS — E559 Vitamin D deficiency, unspecified: Secondary | ICD-10-CM | POA: Diagnosis not present

## 2015-11-18 DIAGNOSIS — F32A Depression, unspecified: Secondary | ICD-10-CM

## 2015-11-18 DIAGNOSIS — Z79890 Hormone replacement therapy (postmenopausal): Secondary | ICD-10-CM

## 2015-11-18 MED ORDER — ERGOCALCIFEROL 1.25 MG (50000 UT) PO CAPS: 50000.0000 [IU] | ORAL_CAPSULE | ORAL | Status: DC

## 2015-11-18 MED ORDER — FLUOXETINE HCL (PMDD) 10 MG PO TABS
ORAL_TABLET | ORAL | Status: DC
Start: 2015-11-18 — End: 2016-02-25

## 2015-11-18 MED FILL — VIT D2 1.25 MG (50,000 UNIT: 1.25 MG | 84 days supply | Qty: 12 | Fill #0

## 2015-11-18 MED FILL — FLUoxetine HCL 10 MG TABS: 10 | 60 days supply | Qty: 60 | Fill #0

## 2015-11-18 NOTE — Patient Instructions (Addendum)
F/u in 3 months, call if you need me before  New medications are weekly vitamin D weekly  New for depression / anxiety / stress is daily fluoxetine  PLEASE speak with employee health  Please work on good  health habits so that your health will improve. 1. Commitment to daily physical activity for 30 to 60  minutes, if you are able to do this.  2. Commitment to wise food choices. Aim for half of your  food intake to be vegetable and fruit, one quarter starchy foods, and one quarter protein. Try to eat on a regular schedule  3 meals per day, snacking between meals should be limited to vegetables or fruits or small portions of nuts. 64 ounces of water per day is generally recommended, unless you have specific health conditions, like heart failure or kidney failure where you will need to limit fluid intake.  3. Commitment to sufficient and a  good quality of physical and mental rest daily, generally between 6 to 8 hours per day.  WITH PERSISTANCE AND PERSEVERANCE, THE IMPOSSIBLE , BECOMES THE NORM!  Thank you  for choosing [facility]. We consider it a privelige to serve you.  Delivering excellent health care in a caring and  compassionate way is our goal.  Partnering with you,  so that together we can achieve this goal is our strategy.

## 2015-11-18 NOTE — Progress Notes (Signed)
   Subjective:    Patient ID: Holly Murphy, female    DOB: 05/11/1961, 55 y.o.   MRN: [id number]  HPI   Holly Murphy     MRN: [id number]      DOB: Mar 13, 1961   HPI [name] is here for follow up and re-evaluation of chronic medical conditions, medication management and review of any available recent lab and radiology data.  Preventive health is updated, specifically  Cancer screening and Immunization.   Questions or concerns regarding consultations or procedures which the PT has had in the interim are  addressed. The PT denies any adverse reactions to current medications since the last visit.  There are no new concerns.  There are no specific complaints   ROS Denies recent fever or chills. Denies sinus pressure, nasal congestion, ear pain or sore throat. Denies chest congestion, productive cough or wheezing. Denies chest pains, palpitations and leg swelling Denies abdominal pain, nausea, vomiting,diarrhea or constipation.   Denies dysuria, frequency, hesitancy or incontinence. Denies joint pain, swelling and limitation in mobility. Denies headaches, seizures, numbness, or tingling. reak down or rash.   PE  BP 118/74 mmHg  Pulse 88  Resp 16  Ht 5\' 3"  (1.6 m)  Wt 137 lb 1.9 oz (62.197 kg)  BMI 24.30 kg/m2  SpO2 100%  Patient alert and oriented and in no cardiopulmonary distress.  HEENT: No facial asymmetry, EOMI,   oropharynx pink and moist.  Neck supple no JVD, no mass.  Chest: Clear to auscultation bilaterally.  CVS: S1, S2 no murmurs, no S3.Regular rate.  ABD: Soft non tender.   Ext: No edema  MS: Adequate ROM spine, shoulders, hips and knees.  Skin: Intact, no ulcerations or rash noted.  Psych: Good eye contact, normal affect. Memory intact not anxious mildly tearful and  depressed appearing.  CNS: CN 2-12 intact, power,  normal throughout.no focal deficits noted.   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

## 2015-11-18 NOTE — Assessment & Plan Note (Signed)
Untreated and again has low vit D, resume  weekly supplement and increase outdoor activities

## 2015-11-18 NOTE — Assessment & Plan Note (Signed)
Stable no change in management, observation only

## 2015-11-18 NOTE — Assessment & Plan Note (Signed)
Decreased level of function at home and not exercising as before Sart fluoxetine and pt strongly encouraged to seek help through employee assistance F/u in 3 months, will message if having problems with medication, and may come in sooner

## 2015-11-18 NOTE — Assessment & Plan Note (Signed)
treated with topical steroid

## 2015-11-18 NOTE — Assessment & Plan Note (Signed)
Uses topical estrogen infrequently, no regular need perceived

## 2015-11-19 LAB — HIV ANTIBODY (ROUTINE TESTING W REFLEX): HIV Screen 4th Generation wRfx: NONREACTIVE

## 2015-11-19 LAB — HEPATITIS C ANTIBODY

## 2016-01-16 MED FILL — RESTASIS 0.05% EYE EMULSION: 0.05 | 90 days supply | Qty: 180 | Fill #0

## 2016-01-21 DIAGNOSIS — Z1283 Encounter for screening for malignant neoplasm of skin: Secondary | ICD-10-CM | POA: Diagnosis not present

## 2016-01-21 DIAGNOSIS — D225 Melanocytic nevi of trunk: Secondary | ICD-10-CM | POA: Diagnosis not present

## 2016-01-28 MED FILL — FLUoxetine HCL 10 MG TABS: 10 | 60 days supply | Qty: 60 | Fill #1

## 2016-01-28 MED FILL — VIT D2 1.25 MG (50,000 UNIT: 1.25 MG | 84 days supply | Qty: 12 | Fill #1

## 2016-02-25 ENCOUNTER — Ambulatory Visit (INDEPENDENT_AMBULATORY_CARE_PROVIDER_SITE_OTHER): Payer: 59 | Admitting: Family Medicine

## 2016-02-25 ENCOUNTER — Encounter: Payer: Self-pay | Admitting: Family Medicine

## 2016-02-25 VITALS — BP 116/76 | HR 74 | Resp 18 | Ht 63.0 in | Wt 142.0 lb

## 2016-02-25 DIAGNOSIS — E559 Vitamin D deficiency, unspecified: Secondary | ICD-10-CM

## 2016-02-25 DIAGNOSIS — F32A Depression, unspecified: Secondary | ICD-10-CM

## 2016-02-25 DIAGNOSIS — F329 Major depressive disorder, single episode, unspecified: Secondary | ICD-10-CM | POA: Diagnosis not present

## 2016-02-25 DIAGNOSIS — E663 Overweight: Secondary | ICD-10-CM | POA: Diagnosis not present

## 2016-02-25 DIAGNOSIS — M545 Low back pain, unspecified: Secondary | ICD-10-CM

## 2016-02-25 NOTE — Assessment & Plan Note (Signed)
Resolved with successful change in work environment, wan off fluoxetine over next 2 weeks Commit to regular exercise

## 2016-02-25 NOTE — Assessment & Plan Note (Signed)
Updated lab needed at/ before next visit. Continue weekly supplement 

## 2016-02-25 NOTE — Assessment & Plan Note (Signed)
Deteriorated. Patient re-educated about  the importance of commitment to a  minimum of 150 minutes of exercise per week.  The importance of healthy food choices with portion control discussed. Encouraged to start a food diary, count calories and to consider  joining a support group. Sample diet sheets offered. Goals set by the patient for the next several months.   Weight /BMI 02/25/2016 11/18/2015 08/29/2015  WEIGHT 142 lb 137 lb 1.9 oz 138 lb 8 oz  HEIGHT 5\' 3"  5\' 3"  5' 2.5"  BMI 25.15 kg/m2 24.3 kg/m2 24.91 kg/m2

## 2016-02-25 NOTE — Assessment & Plan Note (Signed)
Recommend yoga and pilate's for core and back strengthening

## 2016-02-25 NOTE — Progress Notes (Signed)
   Holly Murphy     MRN: [id number]      DOB: Sep 19, 1960   HPI Ms. Kubiak is here for follow up and re-evaluation of chronic medical conditions, medication management and review of any available recent lab and radiology data.  Preventive health is updated, specifically  Cancer screening and Immunization.   Questions or concerns regarding consultations or procedures which the PT has had in the interim are  addressed. The PT denies any adverse reactions to current medications since the last visit.  Much improved since last visit, negative depression screen and extremely comfortable and happy in her new work environment Concerned about weight gain and lack of exercise and will work on both  ROS Denies recent fever or chills. Denies sinus pressure, nasal congestion, ear pain or sore throat. Denies chest congestion, productive cough or wheezing. Denies chest pains, palpitations and leg swelling Denies abdominal pain, nausea, vomiting,diarrhea or constipation.   Denies dysuria, frequency, hesitancy or incontinence. Denies joint pain, swelling and limitation in mobility. Denies headaches, seizures, numbness, or tingling. Denies depression, anxiety or insomnia. Denies skin break down or rash.   PE  BP 116/76   Pulse 74   Resp 18   Ht 5\' 3"  (1.6 m)   Wt 142 lb (64.4 kg)   SpO2 100%   BMI 25.15 kg/m   Patient alert and oriented and in no cardiopulmonary distress.  HEENT: No facial asymmetry, EOMI,   oropharynx pink and moist.  Neck supple no JVD, no mass.  Chest: Clear to auscultation bilaterally.  CVS: S1, S2 no murmurs, no S3.Regular rate.  ABD: Soft non tender.   Ext: No edema  MS: Adequate ROM spine, shoulders, hips and knees.  Skin: Intact, no ulcerations or rash noted.  Psych: Good eye contact, normal affect. Memory intact not anxious or depressed appearing.  CNS: CN 2-12 intact, power,  normal throughout.no focal deficits noted.   Assessment &  Plan  Overweight (BMI 25.0-29.9) Deteriorated. Patient re-educated about  the importance of commitment to a  minimum of 150 minutes of exercise per week.  The importance of healthy food choices with portion control discussed. Encouraged to start a food diary, count calories and to consider  joining a support group. Sample diet sheets offered. Goals set by the patient for the next several months.   Weight /BMI 02/25/2016 11/18/2015 08/29/2015  WEIGHT 142 lb 137 lb 1.9 oz 138 lb 8 oz  HEIGHT 5\' 3"  5\' 3"  5' 2.5"  BMI 25.15 kg/m2 24.3 kg/m2 24.91 kg/m2      Vitamin D deficiency Updated lab needed at/ before next visit. Continue weekly supplement  Backache Recommend yoga and pilate's for core and back strengthening  Depression Resolved with successful change in work environment, wan off fluoxetine over next 2 weeks Commit to regular exercise

## 2016-02-25 NOTE — Patient Instructions (Addendum)
F/U in 6 months, call if you need me before   Vit D, in 5 month and 3 weeks  WEIGHT LOSS GOAL OF 10 POUNDS IN 6 MONTHS    Pls start meal prep, and focus on plant based eating  Enjoy exercise , kloook at yoga etc for back strengthening y to reduce  Pain  Fluoxtine stop in 2 weeks. Take one 3 times this week, alt days, then one twice weekly for week 2  Thankful job is GREAT!

## 2016-05-11 MED FILL — VIT D2 1.25 MG (50,000 UNIT: 1.25 MG | 84 days supply | Qty: 12 | Fill #2

## 2016-07-09 DIAGNOSIS — H524 Presbyopia: Secondary | ICD-10-CM | POA: Diagnosis not present

## 2016-07-09 DIAGNOSIS — H52223 Regular astigmatism, bilateral: Secondary | ICD-10-CM | POA: Diagnosis not present

## 2016-07-09 DIAGNOSIS — H5203 Hypermetropia, bilateral: Secondary | ICD-10-CM | POA: Diagnosis not present

## 2016-08-13 ENCOUNTER — Other Ambulatory Visit: Payer: Self-pay | Admitting: Family Medicine

## 2016-08-13 DIAGNOSIS — Z1231 Encounter for screening mammogram for malignant neoplasm of breast: Secondary | ICD-10-CM

## 2016-08-21 ENCOUNTER — Ambulatory Visit (HOSPITAL_COMMUNITY): Payer: 59

## 2016-08-27 ENCOUNTER — Ambulatory Visit: Payer: 59 | Admitting: Family Medicine

## 2016-09-02 ENCOUNTER — Ambulatory Visit (HOSPITAL_COMMUNITY): Payer: 59

## 2016-09-07 ENCOUNTER — Ambulatory Visit (HOSPITAL_COMMUNITY): Payer: 59

## 2016-09-14 ENCOUNTER — Ambulatory Visit (HOSPITAL_COMMUNITY): Payer: 59

## 2016-09-14 ENCOUNTER — Ambulatory Visit (HOSPITAL_COMMUNITY)
Admission: RE | Admit: 2016-09-14 | Discharge: 2016-09-14 | Disposition: A | Discharge status: Home / Self Care | Payer: 59 | Source: Ambulatory Visit | Attending: Family Medicine | Admitting: Family Medicine

## 2016-09-14 DIAGNOSIS — Z1231 Encounter for screening mammogram for malignant neoplasm of breast: Secondary | ICD-10-CM | POA: Insufficient documentation

## 2016-09-14 IMAGING — MG DIGITAL SCREENING BILATERAL MAMMOGRAM WITH CAD
4 series · 4 of 4 positions shown · non-contrast
Comparison: Previous exam(s).

CLINICAL DATA: Screening.

EXAM:
DIGITAL SCREENING BILATERAL MAMMOGRAM WITH CAD

[R CC]
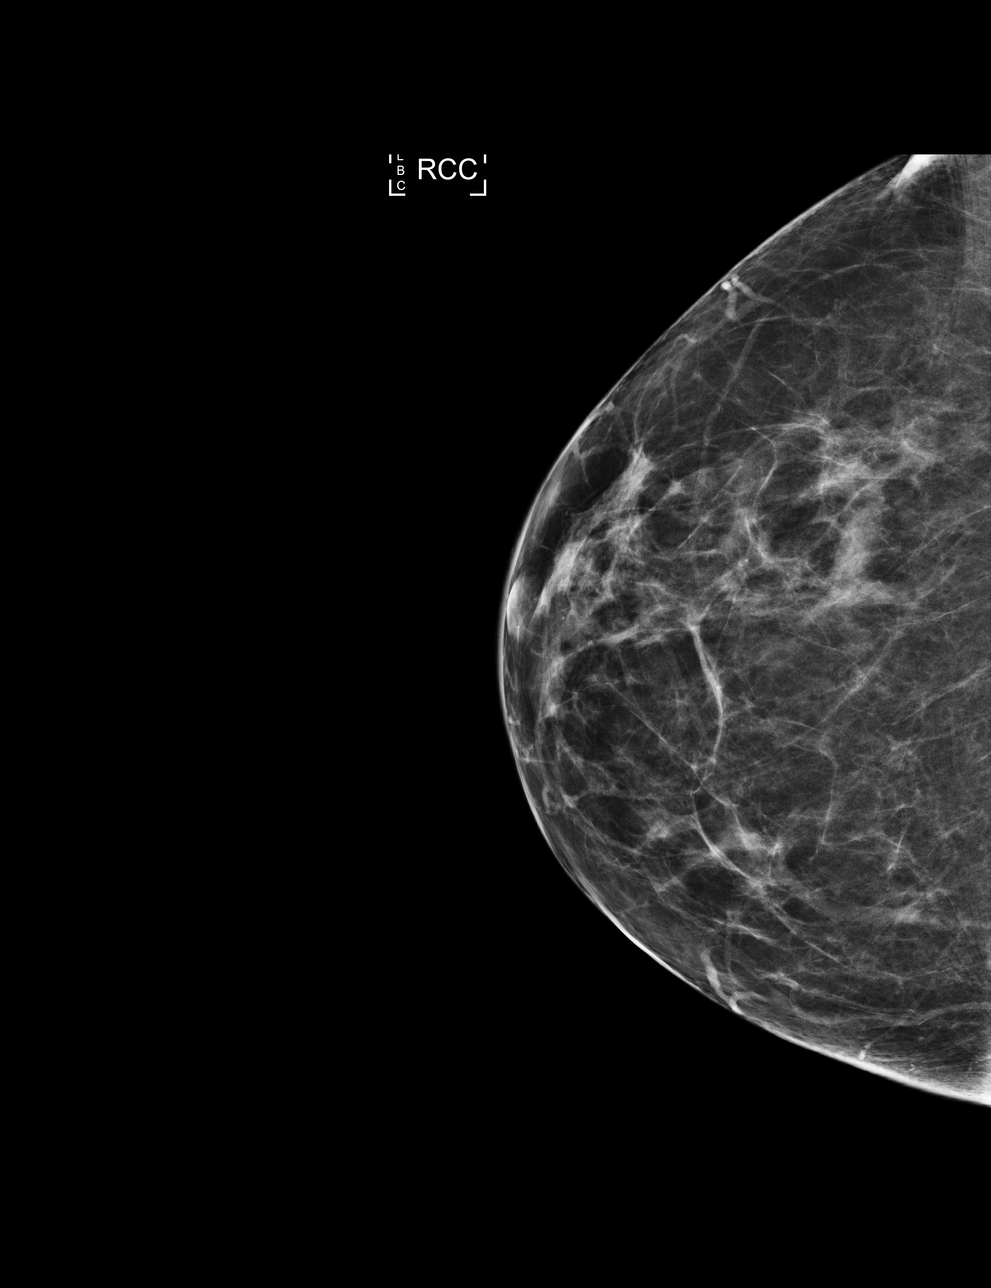

[L CC]
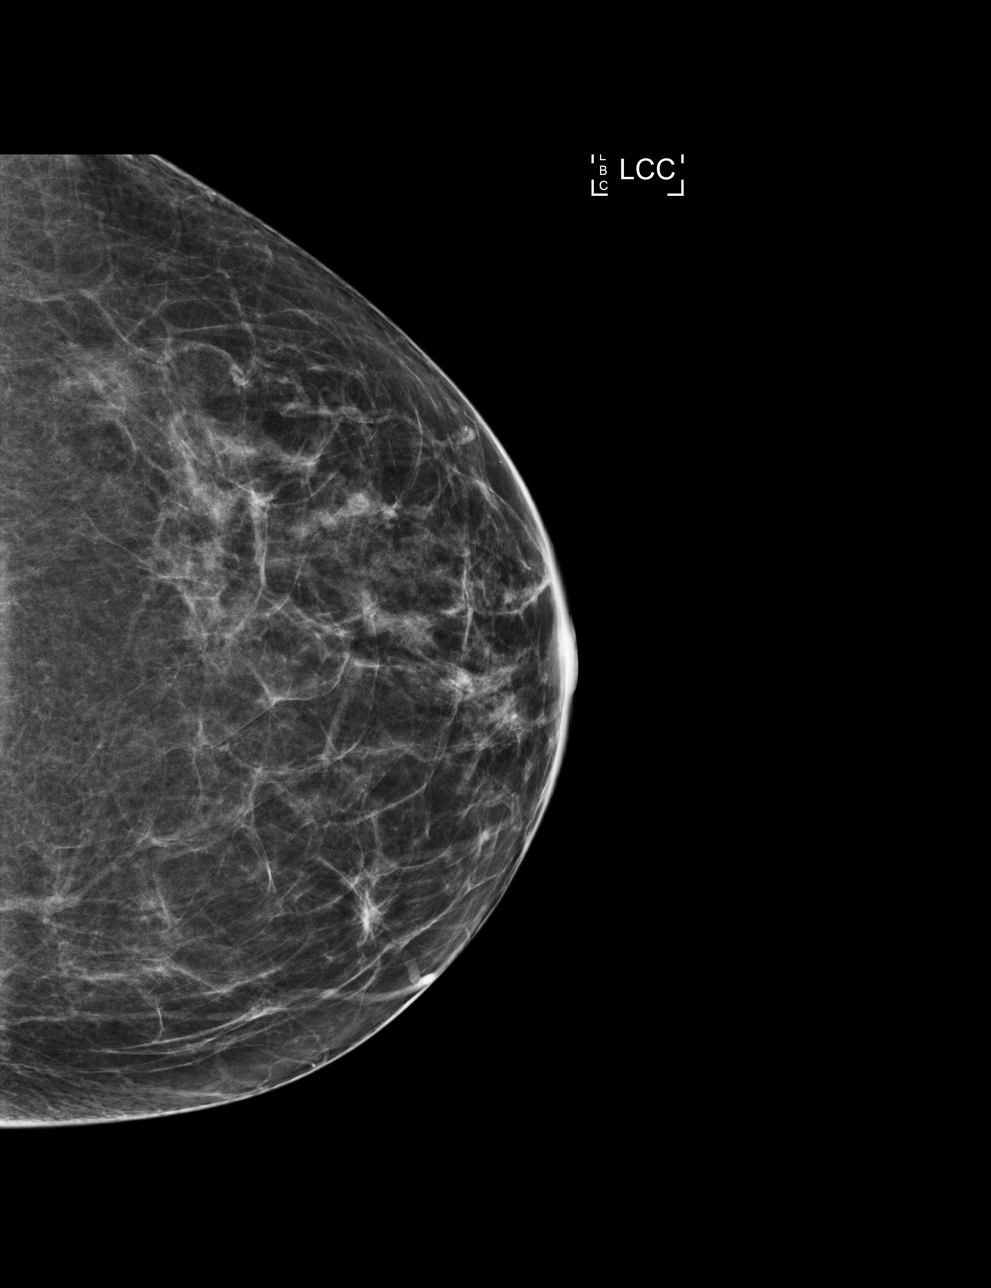

[R MLO]
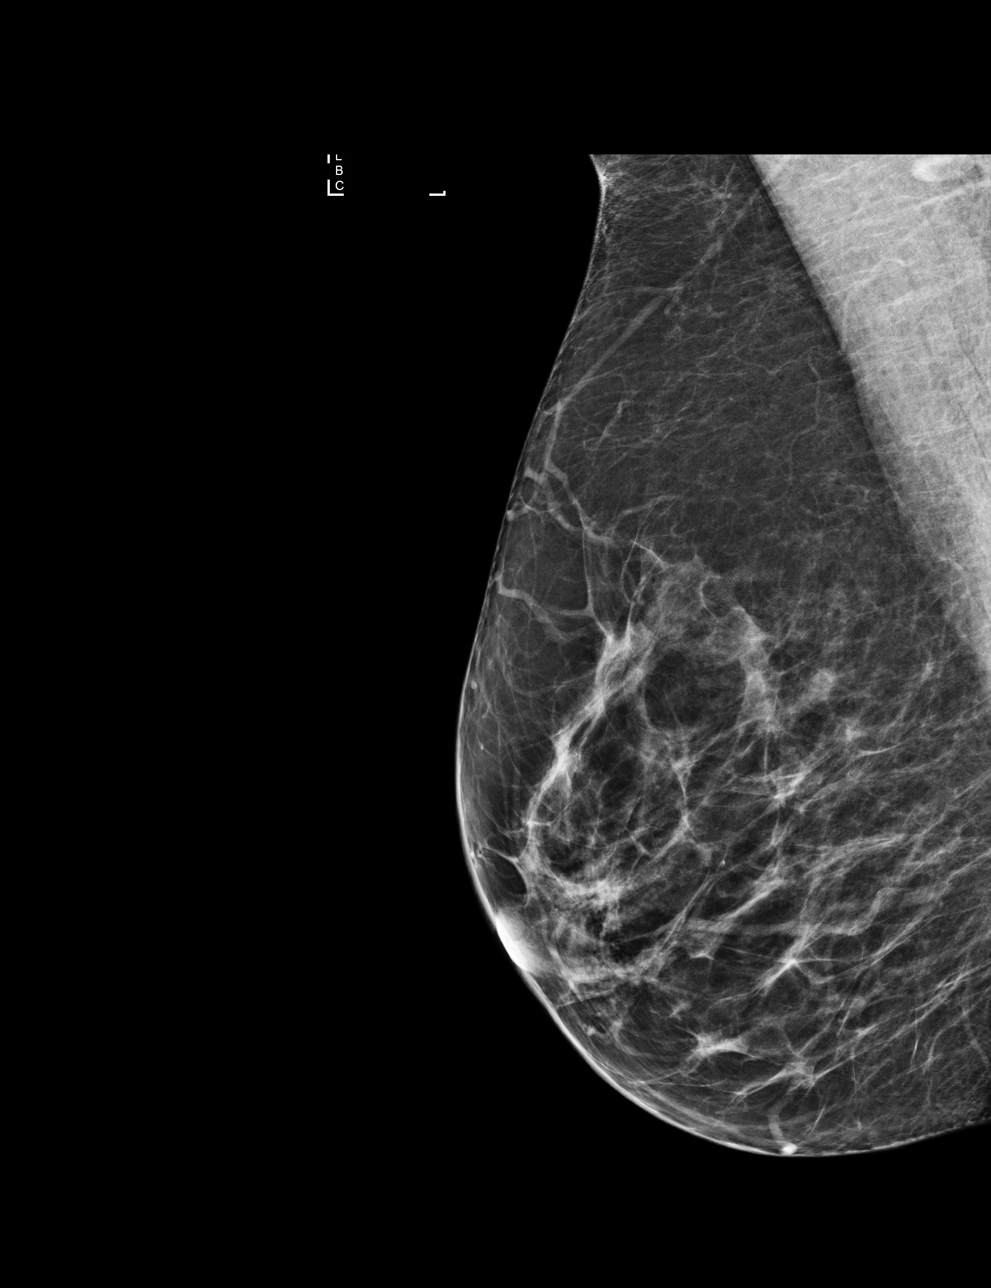

[L MLO]
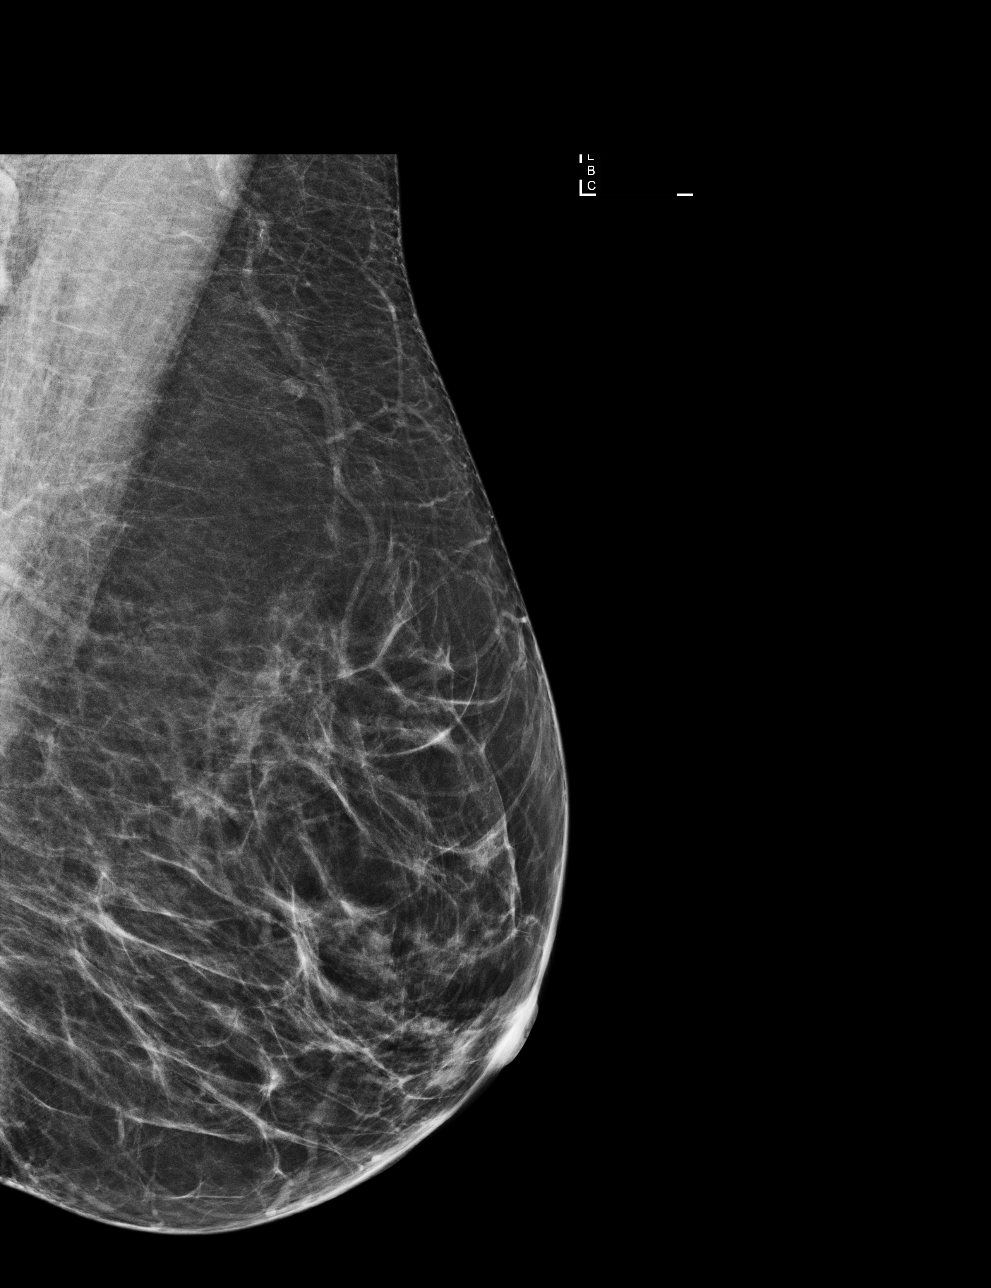

[4 of 4 positions shown; findings below may reference images not displayed]

ACR Breast Density Category b: There are scattered areas of
fibroglandular density.
FINDINGS: There are no findings suspicious for malignancy. Images were
processed with CAD.
IMPRESSION: No mammographic evidence of malignancy. A result letter of this
screening mammogram will be mailed directly to the patient.

RECOMMENDATION:
Screening mammogram in one year. (Code:[US])

BI-RADS CATEGORY  1: Negative.

## 2016-09-22 ENCOUNTER — Telehealth: Payer: Self-pay

## 2016-09-22 DIAGNOSIS — E559 Vitamin D deficiency, unspecified: Secondary | ICD-10-CM

## 2016-09-22 NOTE — Telephone Encounter (Signed)
Labs sent

## 2016-09-23 LAB — VITAMIN D 25 HYDROXY (VIT D DEFICIENCY, FRACTURES): VIT D 25 HYDROXY: 26 ng/mL — AB (ref 30–100)

## 2016-09-28 ENCOUNTER — Ambulatory Visit (INDEPENDENT_AMBULATORY_CARE_PROVIDER_SITE_OTHER): Payer: 59 | Admitting: Family Medicine

## 2016-09-28 ENCOUNTER — Encounter: Payer: Self-pay | Admitting: Family Medicine

## 2016-09-28 VITALS — BP 108/72 | HR 64 | Resp 15 | Ht 63.0 in | Wt 140.8 lb

## 2016-09-28 DIAGNOSIS — E785 Hyperlipidemia, unspecified: Secondary | ICD-10-CM

## 2016-09-28 DIAGNOSIS — E663 Overweight: Secondary | ICD-10-CM

## 2016-09-28 DIAGNOSIS — G8929 Other chronic pain: Secondary | ICD-10-CM | POA: Diagnosis not present

## 2016-09-28 DIAGNOSIS — Z1211 Encounter for screening for malignant neoplasm of colon: Secondary | ICD-10-CM

## 2016-09-28 DIAGNOSIS — M8580 Other specified disorders of bone density and structure, unspecified site: Secondary | ICD-10-CM

## 2016-09-28 DIAGNOSIS — M545 Low back pain: Secondary | ICD-10-CM | POA: Diagnosis not present

## 2016-09-28 DIAGNOSIS — E559 Vitamin D deficiency, unspecified: Secondary | ICD-10-CM | POA: Diagnosis not present

## 2016-09-28 LAB — POC HEMOCCULT BLD/STL (OFFICE/1-CARD/DIAGNOSTIC): FECAL OCCULT BLD: NEGATIVE

## 2016-09-28 NOTE — Progress Notes (Signed)
   Holly Murphy     MRN: [id number]      DOB: 11-13-1960   HPI Ms. Dimitrov is here for follow up and re-evaluation of chronic medical conditions, medication management and review of any available recent lab and radiology data.  Preventive health is updated, specifically  Cancer screening and Immunization.   Questions or concerns regarding consultations or procedures which the PT has had in the interim are  addressed. The PT denies any adverse reactions to current medications since the last visit.  There are no new concerns.  There are no specific complaints   ROS Denies recent fever or chills. Denies sinus pressure, nasal congestion, ear pain or sore throat. Denies chest congestion, productive cough or wheezing. Denies chest pains, palpitations and leg swelling Denies abdominal pain, nausea, vomiting,diarrhea or constipation.   Denies dysuria, frequency, hesitancy or incontinence. Denies joint pain, swelling and limitation in mobility. Denies headaches, seizures, numbness, or tingling. Denies depression, anxiety or insomnia. Denies skin break down or rash.   PE  BP 108/72   Pulse 64   Resp 15   Ht 5\' 3"  (1.6 m)   Wt 140 lb 12.8 oz (63.9 kg)   SpO2 100%   BMI 24.94 kg/m   Patient alert and oriented and in no cardiopulmonary distress.  HEENT: No facial asymmetry, EOMI,   oropharynx pink and moist.  Neck supple no JVD, no mass.  Chest: Clear to auscultation bilaterally.  CVS: S1, S2 no murmurs, no S3.Regular rate.  ABD: Soft non tender. No organomegaly or mass, normal BS  Rectal: no mass, heme negative stool Ext: No edema  MS: Adequate ROM spine, shoulders, hips and knees.  Skin: Intact, no ulcerations or rash noted.  Psych: Good eye contact, normal affect. Memory intact not anxious or depressed appearing.  CNS: CN 2-12 intact, power,  normal throughout.no focal deficits noted.   Assessment & Plan  Vitamin D deficiency Improved will commit to daily oTC  supplement  Overweight (BMI 25.0-29.9) Improved  Patient re-educated about  the importance of commitment to a  minimum of 150 minutes of exercise per week.  The importance of healthy food choices with portion control discussed. Encouraged to start a food diary, count calories and to consider  joining a support group. Sample diet sheets offered. Goals set by the patient for the next several months.   Weight /BMI 09/28/2016 02/25/2016 11/18/2015  WEIGHT 140 lb 12.8 oz 142 lb 137 lb 1.9 oz  HEIGHT 5\' 3"  5\' 3"  5\' 3"   BMI 24.94 kg/m2 25.15 kg/m2 24.3 kg/m2      Osteopenia Weight bearing exercise, with calcium and vit D supplement  Backache Not disabling, uses tylenol or aleve on avg 3 to 4 times per month

## 2016-09-28 NOTE — Patient Instructions (Addendum)
f/u early November, call if you need me before  Colon screen test in office is NORMAL!  Commit to calcium 1000 to 12 00 mg daily for bone health , OK to get it from dietary source info is provided Commit to vit D3 1000 iU daily, this is oTC   Fasting cBC, lipid, chem 7 and TSH end May  Weight loss goal of approx 8 pounds Commit to CLEAN, GREEN eating, this is healthy!  It is important that you exercise regularly at least 30 min utes 5 times a week. If you develop chest pain, have severe difficulty breathing, or feel very tired, stop exercising immediately and seek medical attention   Thank you  for choosing [facility]. We consider it a privelige to serve you.  Delivering excellent health care in a caring and  compassionate way is our goal.  Partnering with you,  so that together we can achieve this goal is our strategy.

## 2016-09-29 NOTE — Assessment & Plan Note (Signed)
Not disabling, uses tylenol or aleve on avg 3 to 4 times per month

## 2016-09-29 NOTE — Assessment & Plan Note (Signed)
Improved will commit to daily oTC supplement

## 2016-09-29 NOTE — Assessment & Plan Note (Signed)
Improved  Patient re-educated about  the importance of commitment to a  minimum of 150 minutes of exercise per week.  The importance of healthy food choices with portion control discussed. Encouraged to start a food diary, count calories and to consider  joining a support group. Sample diet sheets offered. Goals set by the patient for the next several months.   Weight /BMI 09/28/2016 02/25/2016 11/18/2015  WEIGHT 140 lb 12.8 oz 142 lb 137 lb 1.9 oz  HEIGHT 5\' 3"  5\' 3"  5\' 3"   BMI 24.94 kg/m2 25.15 kg/m2 24.3 kg/m2

## 2016-09-29 NOTE — Assessment & Plan Note (Signed)
Weight bearing exercise, with calcium and vit D supplement

## 2017-04-12 DIAGNOSIS — Z1283 Encounter for screening for malignant neoplasm of skin: Secondary | ICD-10-CM | POA: Diagnosis not present

## 2017-04-12 DIAGNOSIS — D225 Melanocytic nevi of trunk: Secondary | ICD-10-CM | POA: Diagnosis not present

## 2017-05-17 ENCOUNTER — Ambulatory Visit: Payer: 59 | Admitting: Family Medicine

## 2017-05-24 ENCOUNTER — Telehealth: Payer: Self-pay | Admitting: Family Medicine

## 2017-05-24 DIAGNOSIS — E559 Vitamin D deficiency, unspecified: Secondary | ICD-10-CM

## 2017-05-24 DIAGNOSIS — E785 Hyperlipidemia, unspecified: Secondary | ICD-10-CM

## 2017-05-25 ENCOUNTER — Ambulatory Visit (INDEPENDENT_AMBULATORY_CARE_PROVIDER_SITE_OTHER): Payer: 59 | Admitting: Family Medicine

## 2017-05-25 ENCOUNTER — Encounter: Payer: Self-pay | Admitting: Family Medicine

## 2017-05-25 VITALS — BP 118/74 | HR 65 | Ht 63.0 in | Wt 140.0 lb

## 2017-05-25 DIAGNOSIS — M8580 Other specified disorders of bone density and structure, unspecified site: Secondary | ICD-10-CM | POA: Diagnosis not present

## 2017-05-25 DIAGNOSIS — Z1211 Encounter for screening for malignant neoplasm of colon: Secondary | ICD-10-CM

## 2017-05-25 DIAGNOSIS — E559 Vitamin D deficiency, unspecified: Secondary | ICD-10-CM

## 2017-05-25 DIAGNOSIS — Z0000 Encounter for general adult medical examination without abnormal findings: Secondary | ICD-10-CM

## 2017-05-25 DIAGNOSIS — E663 Overweight: Secondary | ICD-10-CM | POA: Diagnosis not present

## 2017-05-25 DIAGNOSIS — K648 Other hemorrhoids: Secondary | ICD-10-CM

## 2017-05-25 DIAGNOSIS — K5900 Constipation, unspecified: Secondary | ICD-10-CM | POA: Diagnosis not present

## 2017-05-25 DIAGNOSIS — E785 Hyperlipidemia, unspecified: Secondary | ICD-10-CM | POA: Diagnosis not present

## 2017-05-25 LAB — POC HEMOCCULT BLD/STL (OFFICE/1-CARD/DIAGNOSTIC): FECAL OCCULT BLD: POSITIVE — AB

## 2017-05-25 NOTE — Progress Notes (Signed)
Holly Murphy     MRN: [id number]      DOB: 12-Jun-1961   HPI Holly Murphy is here for annual Preventive visit , specifically  Cancer screening and Immunization.   Questions or concerns regarding consultations or procedures which the PT has had in the interim are  addressed. sit.  C/o constipation, goes on average every 3 to 4 days ,strains and hard stool, today, does have small hemorrhoids from recent colonoscopy Denies change in stool caliber, poor appetite or unintentional weight loss No regular exercise commitment and poor eating habits as far as fruit and vegetable are concerned as well as fiber  ROS Denies recent fever or chills. Denies sinus pressure, nasal congestion, ear pain or sore throat. Denies chest congestion, productive cough or wheezing. Denies chest pains, palpitations and leg swelling .   Denies dysuria, frequency, hesitancy or incontinence. Denies joint pain, swelling and limitation in mobility. Denies headaches, seizures, numbness, or tingling. Denies depression, anxiety or insomnia. Denies skin break down or rash.   PE  BP 118/74   Pulse 65   Ht 5\' 4"  (1.626 m)   Wt 140 lb (63.5 kg)   SpO2 98%   BMI 24.03 kg/m  Patient alert and oriented and in no cardiopulmonary distress.  HEENT: No facial asymmetry, EOMI,   oropharynx pink and moist.  Neck supple no JVD, no mass.  Chest: Clear to auscultation bilaterally. Breast: not examined, normal mammogram earlier this year  CVS: S1, S2 no murmurs, no S3.Regular rate.  ABD: Soft non tender. No organomegaly or mass, normal BS Rectal: hemorrhoids, small, heme positive stool  Pe;lvic: not examined, asymptomatic, geenrally  Sees gyne Ext: No edema  MS: Adequate ROM spine, shoulders, hips and knees.  Skin: Intact, no ulcerations or rash noted.  Psych: Good eye contact, normal affect. Memory intact not anxious or depressed appearing.  CNS: CN 2-12 intact, power,  normal throughout.no focal deficits  noted.   Assessment & Plan  Constipation Reports recent straining at stool and constipation , with bM approximately 3 days prior, states she is aware that her diet is inadequate as far as fiber and vegetable is conceerned and her exercise has fallen off , will work on all of this  Hemorrhoids Heme positive stool on exam today,reports recent straining at stool and constipation, established dx of small external hemorrhoids. No symptoms concerning for GI malignancy, denies any change in stool caliber, black stool or appetite change or unintentional weight loss Will work on change in diet , rept test in 6 months as well as stool card, of note, earlier this year she was heme negative  Visit for preventive health examination Exam as documented  Focus on commitment ot healthy diet , and exercise commitment. Pt has good sleep hygiene and her mental health is good  Vitamin D deficiency Uncorrected, nees to commit to supplementation to correct as she has osteopenia  Overweight (BMI 25.0-29.9) Unchanged Patient re-educated about  the importance of commitment to a  minimum of 150 minutes of exercise per week.  The importance of healthy food choices with portion control discussed. Encouraged to start a food diary, count calories and to consider  joining a support group. Sample diet sheets offered. Goals set by the patient for the next several months.   Weight /BMI 05/25/2017 09/28/2016 02/25/2016  WEIGHT 140 lb 140 lb 12.8 oz 142 lb  HEIGHT 5\' 4"  5\' 3"  5\' 3"   BMI 24.03 kg/m2 24.94 kg/m2 25.15 kg/m2  Osteopenia Updated dexa needed and needs to commit to regular weight bearing  exercise  Dyslipidemia, goal LDL below 100 Hyperlipidemia:Low fat diet discussed and encouraged.   Lipid Panel  Lab Results  Component Value Date   CHOL 209 (H) 05/25/2017   HDL 82 05/25/2017   LDLCALC 99 11/15/2015   TRIG 57 05/25/2017   CHOLHDL 2.5 05/25/2017  needs to reduce fat in diet as cholesterol  is elevated

## 2017-05-25 NOTE — Patient Instructions (Signed)
Annual exam in 12 months , call if you need me before  It is important that you exercise regularly at least 30 minutes 5 times a week. If you develop chest pain, have severe difficulty breathing, or feel very tired, stop exercising immediately and seek medical attention   You need a bone density test ,   Rectal exam today for colon screenn  Labs today, CBC, lipid, cmp and EeGFR, TSH and vit D  Thank you  for choosing [facility]. We consider it a privelige to serve you.  Delivering excellent health care in a caring and  compassionate way is our goal.  Partnering with you,  so that together we can achieve this goal is our strategy.

## 2017-05-26 LAB — COMPLETE METABOLIC PANEL WITH GFR
AG Ratio: 1.8 (calc) (ref 1.0–2.5)
ALT: 9 U/L (ref 6–29)
AST: 14 U/L (ref 10–35)
Albumin: 4.3 g/dL (ref 3.6–5.1)
Alkaline phosphatase (APISO): 53 U/L (ref 33–130)
BUN: 9 mg/dL (ref 7–25)
CALCIUM: 9.4 mg/dL (ref 8.6–10.4)
CO2: 31 mmol/L (ref 20–32)
Chloride: 105 mmol/L (ref 98–110)
Creat: 0.61 mg/dL (ref 0.50–1.05)
GFR, EST AFRICAN AMERICAN: 119 mL/min/{1.73_m2} (ref >=60)
GFR, EST NON AFRICAN AMERICAN: 103 mL/min/{1.73_m2} (ref >=60)
Globulin: 2.4 g/dL (calc) (ref 1.9–3.7)
Glucose, Bld: 92 mg/dL (ref 65–99)
Potassium: 4.2 mmol/L (ref 3.5–5.3)
Sodium: 140 mmol/L (ref 135–146)
TOTAL PROTEIN: 6.7 g/dL (ref 6.1–8.1)
Total Bilirubin: 0.4 mg/dL (ref 0.2–1.2)

## 2017-05-26 LAB — CBC
HCT: 38.9 % (ref 35.0–45.0)
Hemoglobin: 13.2 g/dL (ref 11.7–15.5)
MCH: 30.9 pg (ref 27.0–33.0)
MCHC: 33.9 g/dL (ref 32.0–36.0)
MCV: 91.1 fL (ref 80.0–100.0)
MPV: 9.9 fL (ref 7.5–12.5)
Platelets: 279 10*3/uL (ref 140–400)
RBC: 4.27 10*6/uL (ref 3.80–5.10)
RDW: 12.3 % (ref 11.0–15.0)
WBC: 3.6 10*3/uL — ABNORMAL LOW (ref 3.8–10.8)

## 2017-05-26 LAB — LIPID PANEL
Cholesterol: 209 mg/dL — ABNORMAL HIGH (ref <200)
HDL: 82 mg/dL (ref >50)
LDL Cholesterol (Calc): 113 mg/dL (calc) — ABNORMAL HIGH
NON-HDL CHOLESTEROL (CALC): 127 mg/dL (ref <130)
Total CHOL/HDL Ratio: 2.5 (calc) (ref <5.0)
Triglycerides: 57 mg/dL (ref <150)

## 2017-05-26 LAB — VITAMIN D 25 HYDROXY (VIT D DEFICIENCY, FRACTURES): Vit D, 25-Hydroxy: 23 ng/mL — ABNORMAL LOW (ref 30–100)

## 2017-05-26 LAB — TSH: TSH: 1.02 mIU/L

## 2017-05-28 ENCOUNTER — Encounter: Payer: Self-pay | Admitting: Family Medicine

## 2017-05-28 ENCOUNTER — Telehealth: Payer: Self-pay | Admitting: Family Medicine

## 2017-05-28 ENCOUNTER — Other Ambulatory Visit: Payer: Self-pay | Admitting: Family Medicine

## 2017-05-28 DIAGNOSIS — Z780 Asymptomatic menopausal state: Secondary | ICD-10-CM

## 2017-05-28 DIAGNOSIS — M810 Age-related osteoporosis without current pathological fracture: Secondary | ICD-10-CM

## 2017-05-28 DIAGNOSIS — D72819 Decreased white blood cell count, unspecified: Secondary | ICD-10-CM

## 2017-05-28 MED ORDER — ERGOCALCIFEROL 1.25 MG (50000 UT) PO CAPS: 50000.0000 [IU] | ORAL_CAPSULE | ORAL | 1 refills | Status: DC

## 2017-05-28 NOTE — Telephone Encounter (Signed)
Ordered and mailed

## 2017-05-28 NOTE — Telephone Encounter (Signed)
pls from pts lab results this week , she needs repeat fasting lipid in 5.5 months also vit D and CBC ordered, and will need an appt for an 8 am visit , I am guessing again on a Tuesday, pls mail her the lab order sheet and appt card info, I sent her a result note , thanks

## 2017-05-28 NOTE — Addendum Note (Signed)
Addended by: Eual Fines on: 05/28/2017 10:15 AM   Modules accepted: Orders

## 2017-05-30 ENCOUNTER — Encounter: Payer: Self-pay | Admitting: Family Medicine

## 2017-05-30 DIAGNOSIS — Z0000 Encounter for general adult medical examination without abnormal findings: Secondary | ICD-10-CM | POA: Insufficient documentation

## 2017-05-30 DIAGNOSIS — K649 Unspecified hemorrhoids: Secondary | ICD-10-CM | POA: Insufficient documentation

## 2017-05-30 NOTE — Assessment & Plan Note (Signed)
Exam as documented  Focus on commitment ot healthy diet , and exercise commitment. Pt has good sleep hygiene and her mental health is good

## 2017-05-30 NOTE — Assessment & Plan Note (Signed)
Uncorrected, nees to commit to supplementation to correct as she has osteopenia

## 2017-05-30 NOTE — Assessment & Plan Note (Addendum)
Unchanged Patient re-educated about  the importance of commitment to a  minimum of 150 minutes of exercise per week.  The importance of healthy food choices with portion control discussed. Encouraged to start a food diary, count calories and to consider  joining a support group. Sample diet sheets offered. Goals set by the patient for the next several months.   Weight /BMI 05/25/2017 09/28/2016 02/25/2016  WEIGHT 140 lb 140 lb 12.8 oz 142 lb  HEIGHT 5\' 4"  5\' 3"  5\' 3"   BMI 24.03 kg/m2 24.94 kg/m2 25.15 kg/m2

## 2017-05-30 NOTE — Assessment & Plan Note (Signed)
Hyperlipidemia:Low fat diet discussed and encouraged.   Lipid Panel  Lab Results  Component Value Date   CHOL 209 (H) 05/25/2017   HDL 82 05/25/2017   LDLCALC 99 11/15/2015   TRIG 57 05/25/2017   CHOLHDL 2.5 05/25/2017  needs to reduce fat in diet as cholesterol is elevated

## 2017-05-30 NOTE — Assessment & Plan Note (Signed)
Heme positive stool on exam today,reports recent straining at stool and constipation, established dx of small external hemorrhoids. No symptoms concerning for GI malignancy, denies any change in stool caliber, black stool or appetite change or unintentional weight loss Will work on change in diet , rept test in 6 months as well as stool card, of note, earlier this year she was heme negative

## 2017-05-30 NOTE — Assessment & Plan Note (Signed)
Updated dexa needed and needs to commit to regular weight bearing  exercise

## 2017-05-30 NOTE — Assessment & Plan Note (Addendum)
Reports recent straining at stool and constipation , with bM approximately 3 days prior, states she is aware that her diet is inadequate as far as fiber and vegetable is conceerned and her exercise has fallen off , will work on all of this

## 2017-06-01 ENCOUNTER — Ambulatory Visit (HOSPITAL_COMMUNITY)
Admission: RE | Admit: 2017-06-01 | Discharge: 2017-06-01 | Disposition: A | Discharge status: Home / Self Care | Payer: 59 | Source: Ambulatory Visit | Attending: Family Medicine | Admitting: Family Medicine

## 2017-06-01 DIAGNOSIS — M810 Age-related osteoporosis without current pathological fracture: Secondary | ICD-10-CM | POA: Insufficient documentation

## 2017-06-01 DIAGNOSIS — M85852 Other specified disorders of bone density and structure, left thigh: Secondary | ICD-10-CM | POA: Diagnosis not present

## 2017-06-01 DIAGNOSIS — Z780 Asymptomatic menopausal state: Secondary | ICD-10-CM | POA: Diagnosis not present

## 2017-06-02 ENCOUNTER — Encounter: Payer: Self-pay | Admitting: Family Medicine

## 2017-07-08 DIAGNOSIS — H5203 Hypermetropia, bilateral: Secondary | ICD-10-CM | POA: Diagnosis not present

## 2017-07-08 DIAGNOSIS — H524 Presbyopia: Secondary | ICD-10-CM | POA: Diagnosis not present

## 2017-07-08 DIAGNOSIS — H52223 Regular astigmatism, bilateral: Secondary | ICD-10-CM | POA: Diagnosis not present

## 2017-11-02 ENCOUNTER — Ambulatory Visit: Payer: 59 | Admitting: Family Medicine

## 2017-11-04 ENCOUNTER — Ambulatory Visit: Payer: 59 | Admitting: Family Medicine

## 2017-11-16 ENCOUNTER — Telehealth: Payer: Self-pay | Admitting: Family Medicine

## 2017-11-16 DIAGNOSIS — E559 Vitamin D deficiency, unspecified: Secondary | ICD-10-CM

## 2017-11-16 DIAGNOSIS — E785 Hyperlipidemia, unspecified: Secondary | ICD-10-CM

## 2017-11-16 DIAGNOSIS — D708 Other neutropenia: Secondary | ICD-10-CM

## 2017-11-16 NOTE — Telephone Encounter (Signed)
Called patient to r/s appt per r/s list. No answer, no voicemail.

## 2017-11-16 NOTE — Telephone Encounter (Signed)
Patients appointment was moved into June due to reschedule list, can you please re order her labs they will have expired by then.

## 2017-11-23 NOTE — Telephone Encounter (Signed)
Labs ordered.

## 2017-12-02 ENCOUNTER — Ambulatory Visit: Payer: 59 | Admitting: Family Medicine

## 2017-12-03 ENCOUNTER — Other Ambulatory Visit: Payer: Self-pay | Admitting: Family Medicine

## 2017-12-03 DIAGNOSIS — Z1231 Encounter for screening mammogram for malignant neoplasm of breast: Secondary | ICD-10-CM

## 2017-12-09 ENCOUNTER — Ambulatory Visit (HOSPITAL_COMMUNITY)
Admission: RE | Admit: 2017-12-09 | Discharge: 2017-12-09 | Disposition: A | Discharge status: Home / Self Care | Payer: 59 | Source: Ambulatory Visit | Attending: Family Medicine | Admitting: Family Medicine

## 2017-12-09 ENCOUNTER — Encounter (HOSPITAL_COMMUNITY): Payer: Self-pay

## 2017-12-09 DIAGNOSIS — Z1231 Encounter for screening mammogram for malignant neoplasm of breast: Secondary | ICD-10-CM | POA: Diagnosis not present

## 2017-12-09 IMAGING — MG DIGITAL SCREENING BILATERAL MAMMOGRAM WITH CAD
4 series · 4 of 4 positions shown · non-contrast
Comparison: Previous exam(s).

CLINICAL DATA: Screening.

EXAM:
DIGITAL SCREENING BILATERAL MAMMOGRAM WITH CAD

[R MLO]
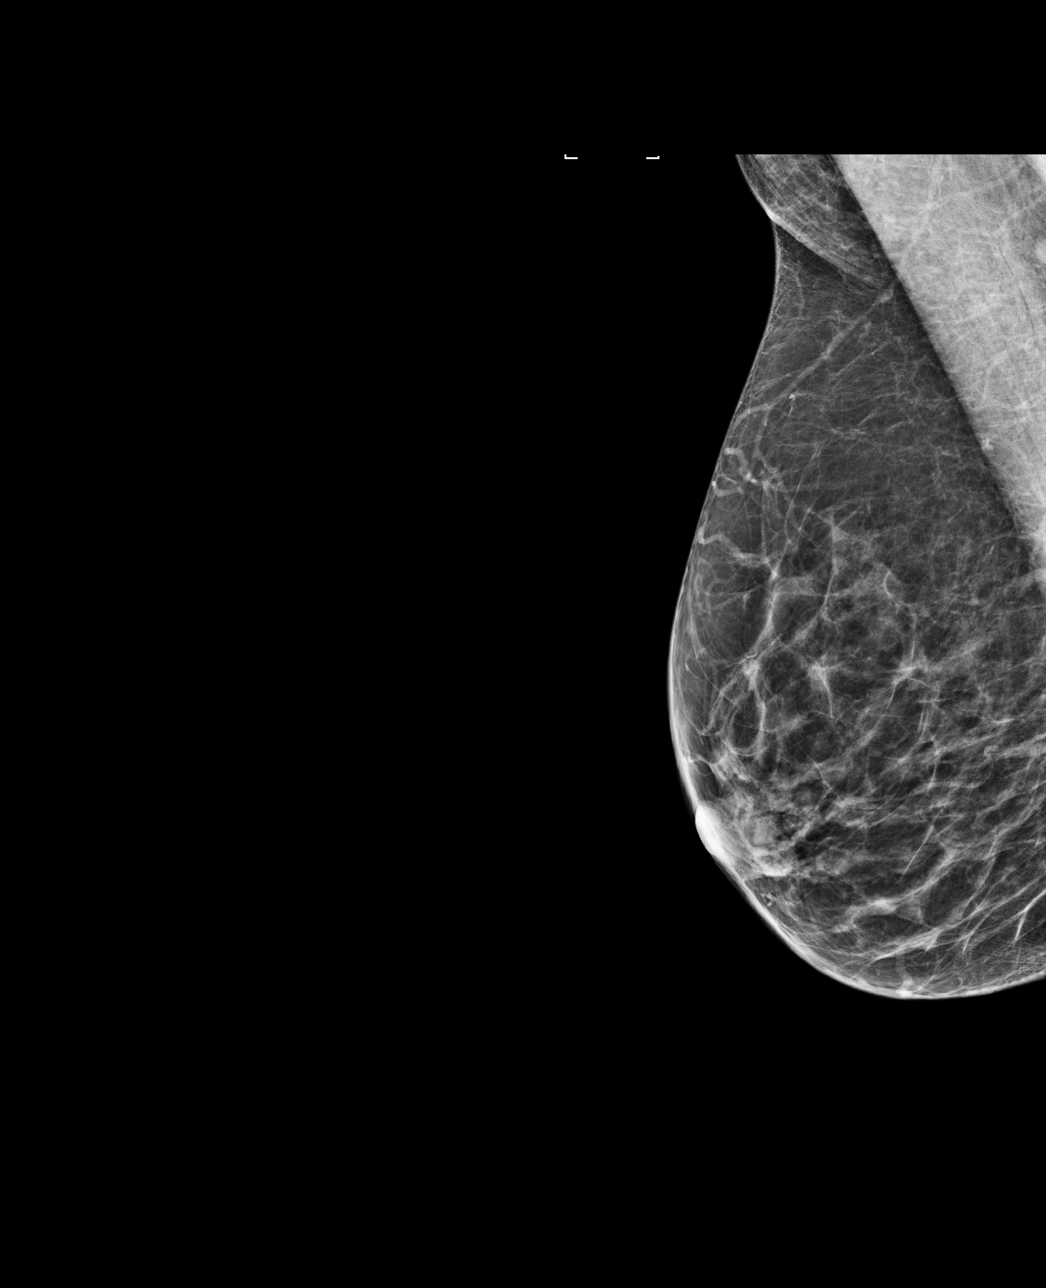

[R CC]
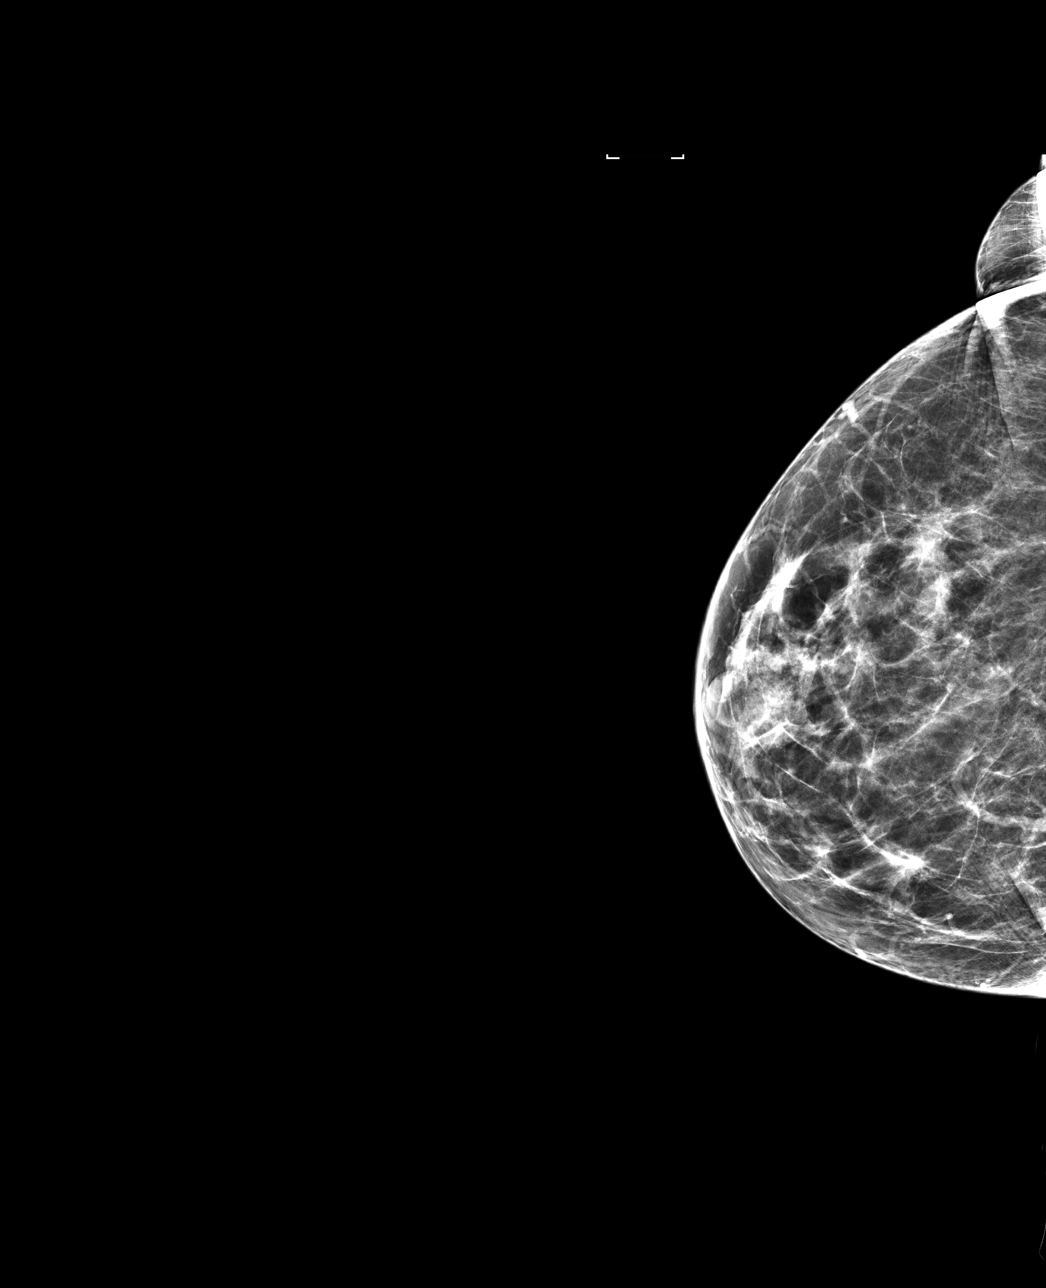

[L MLO]
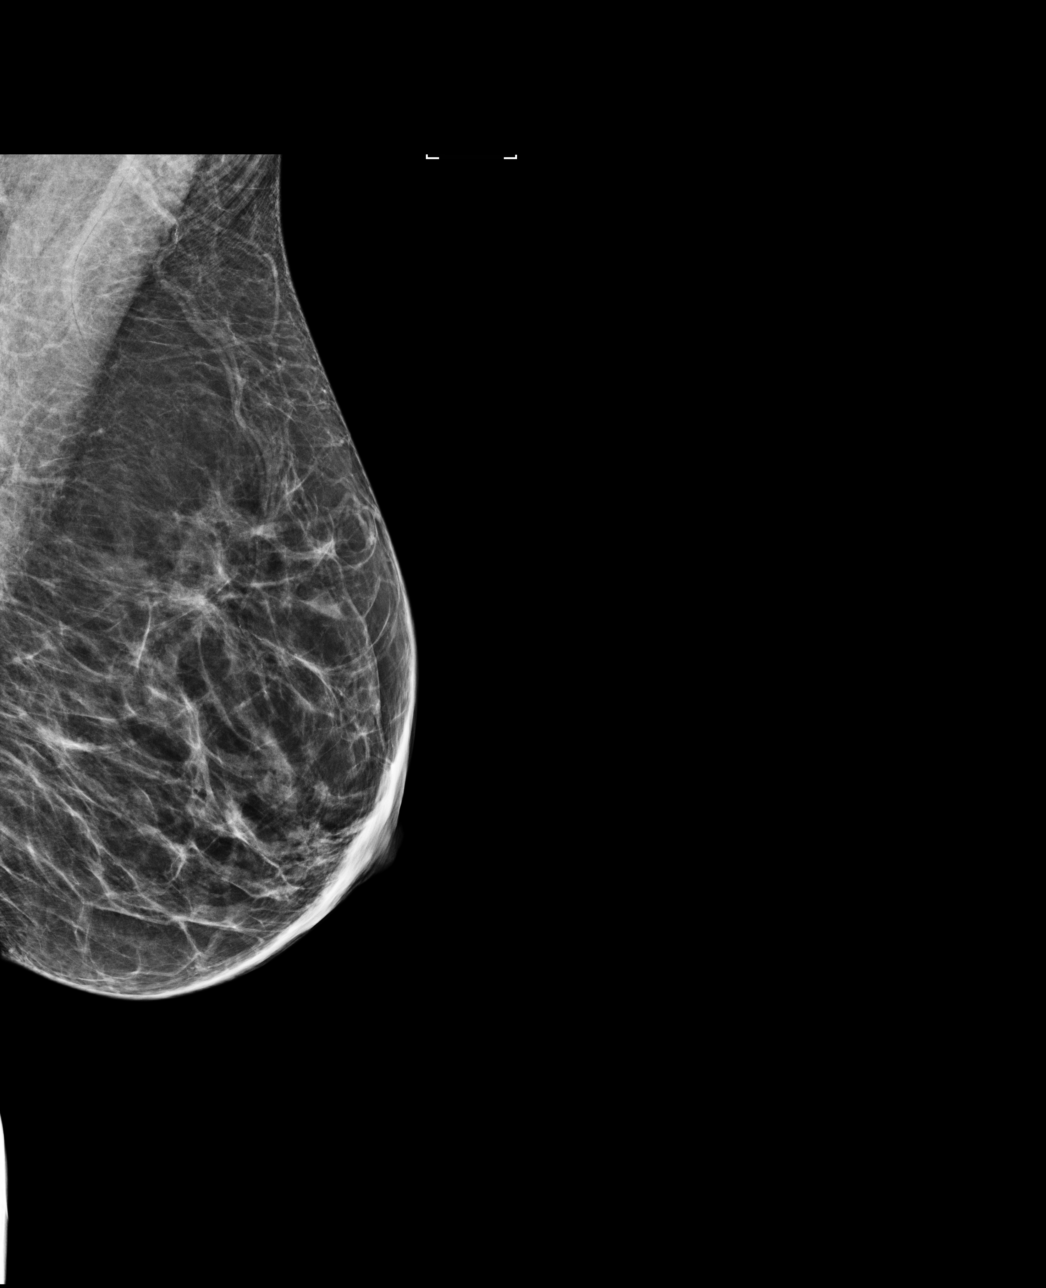

[L CC]
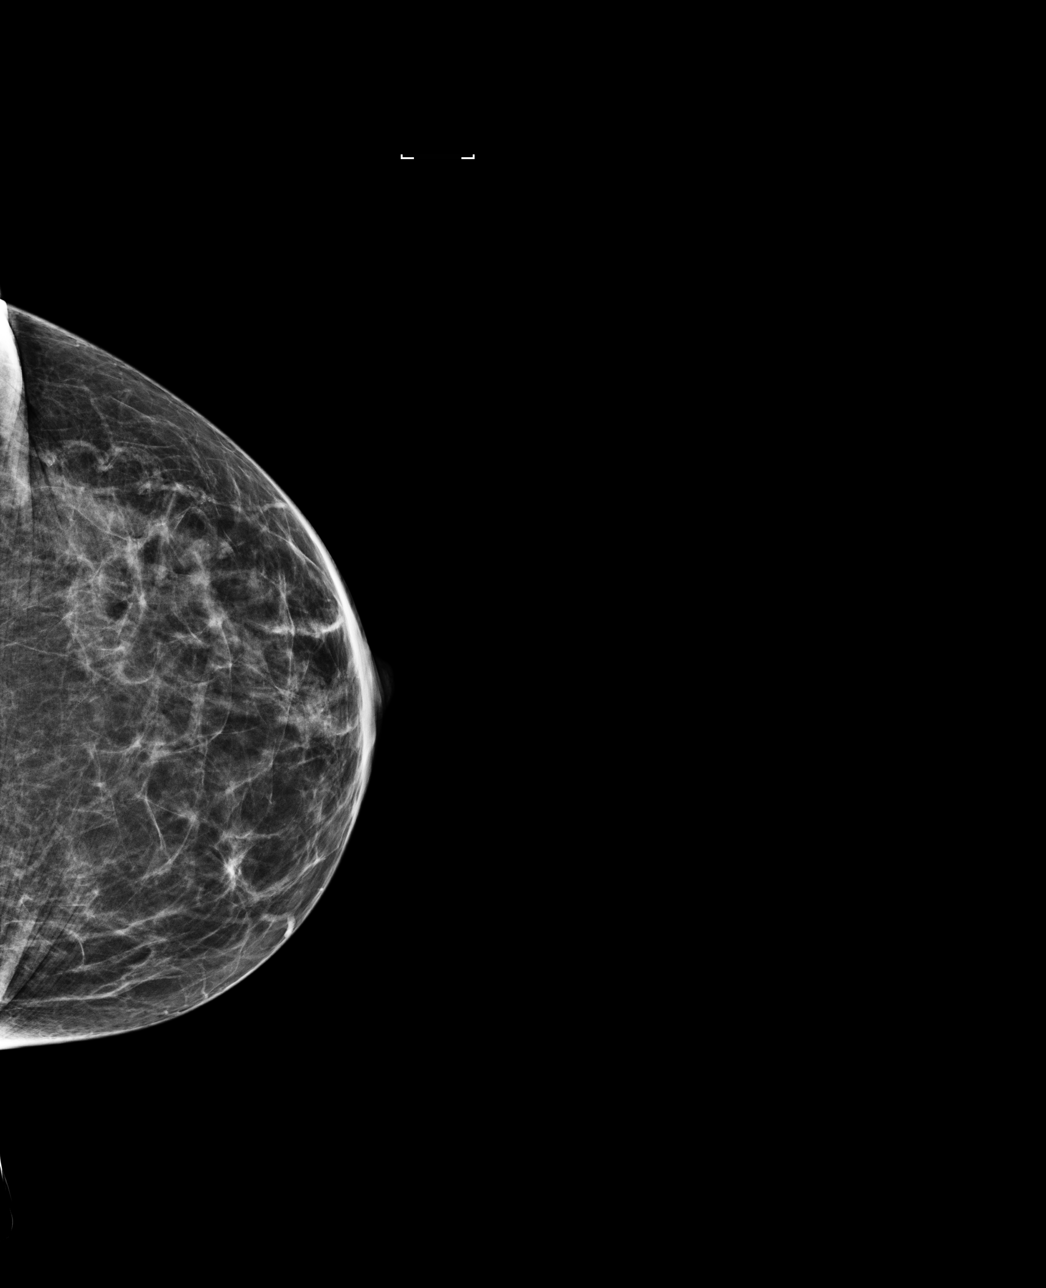

[4 of 4 positions shown; findings below may reference images not displayed]

ACR Breast Density Category b: There are scattered areas of
fibroglandular density.
FINDINGS: There are no findings suspicious for malignancy. Images were
processed with CAD.
IMPRESSION: No mammographic evidence of malignancy. A result letter of this
screening mammogram will be mailed directly to the patient.

RECOMMENDATION:
Screening mammogram in one year. (Code:[US])

BI-RADS CATEGORY  1: Negative.

## 2017-12-21 DIAGNOSIS — E559 Vitamin D deficiency, unspecified: Secondary | ICD-10-CM | POA: Diagnosis not present

## 2017-12-21 DIAGNOSIS — D708 Other neutropenia: Secondary | ICD-10-CM | POA: Diagnosis not present

## 2017-12-21 DIAGNOSIS — E785 Hyperlipidemia, unspecified: Secondary | ICD-10-CM | POA: Diagnosis not present

## 2017-12-22 ENCOUNTER — Encounter: Payer: Self-pay | Admitting: Family Medicine

## 2017-12-22 LAB — CBC
HCT: 37.3 % (ref 35.0–45.0)
Hemoglobin: 13 g/dL (ref 11.7–15.5)
MCH: 31.9 pg (ref 27.0–33.0)
MCHC: 34.9 g/dL (ref 32.0–36.0)
MCV: 91.6 fL (ref 80.0–100.0)
MPV: 10.3 fL (ref 7.5–12.5)
PLATELETS: 272 10*3/uL (ref 140–400)
RBC: 4.07 10*6/uL (ref 3.80–5.10)
RDW: 12.3 % (ref 11.0–15.0)
WBC: 3.9 10*3/uL (ref 3.8–10.8)

## 2017-12-22 LAB — LIPID PANEL
CHOLESTEROL: 197 mg/dL (ref <200)
HDL: 68 mg/dL (ref >50)
LDL Cholesterol (Calc): 115 mg/dL (calc) — ABNORMAL HIGH
Non-HDL Cholesterol (Calc): 129 mg/dL (calc) (ref <130)
Total CHOL/HDL Ratio: 2.9 (calc) (ref <5.0)
Triglycerides: 56 mg/dL (ref <150)

## 2017-12-22 LAB — VITAMIN D 25 HYDROXY (VIT D DEFICIENCY, FRACTURES): VIT D 25 HYDROXY: 31 ng/mL (ref 30–100)

## 2018-01-04 ENCOUNTER — Encounter: Payer: Self-pay | Admitting: Family Medicine

## 2018-01-04 ENCOUNTER — Ambulatory Visit (INDEPENDENT_AMBULATORY_CARE_PROVIDER_SITE_OTHER): Payer: 59 | Admitting: Family Medicine

## 2018-01-04 VITALS — BP 116/74 | HR 77 | Ht 63.0 in | Wt 116.0 lb

## 2018-01-04 DIAGNOSIS — E785 Hyperlipidemia, unspecified: Secondary | ICD-10-CM | POA: Diagnosis not present

## 2018-01-04 DIAGNOSIS — E663 Overweight: Secondary | ICD-10-CM

## 2018-01-04 DIAGNOSIS — K5900 Constipation, unspecified: Secondary | ICD-10-CM | POA: Diagnosis not present

## 2018-01-04 DIAGNOSIS — Z1211 Encounter for screening for malignant neoplasm of colon: Secondary | ICD-10-CM

## 2018-01-04 DIAGNOSIS — E559 Vitamin D deficiency, unspecified: Secondary | ICD-10-CM

## 2018-01-04 NOTE — Progress Notes (Signed)
   Holly Murphy     MRN: [id number]      DOB: August 22, 1960   HPI [name] is here for follow up and re-evaluation of chronic medical conditions, medication management and review of any available recent lab and radiology data.  Preventive health is updated, specifically  Cancer screening and Immunization.   . The PT denies any adverse reactions to current medications since the last visit.  Pt elected to join weight watchers when chewer attempt at weight loss was unsuccessful, and she lost the 2 pounds needed to achieves her goal weight in the past 6 months, with l vittle effort or sense of deprivations which is extremely impressive She denies fatigue, abdominal pain or change in BM, no black stool or abnormal appearance of the stool  ROS Denies recent fever or chills. Denies sinus pressure, nasal congestion, ear pain or sore throat. Denies chest congestion, productive cough or wheezing. Denies chest pains, palpitations and leg swelling Denies abdominal pain, nausea, vomiting,diarrhea or constipation.   Denies dysuria, frequency, hesitancy or incontinence. Denies joint pain, swelling and limitation in mobility. Denies headaches, seizures, numbness, or tingling. Denies depression, anxiety or insomnia. Denies skin break down or rash. BP 116/74   Pulse 77   Wt 116 lb (52.6 kg)   BMI 20.55 kg/m   PE  BP 116/74   Pulse 77   Wt 116 lb (52.6 kg)   BMI 20.55 kg/m   Patient alert and oriented and in no cardiopulmonary distress.  HEENT: No facial asymmetry, EOMI,   oropharynx pink and moist.  Neck supple no JVD, no mass.  Chest: Clear to auscultation bilaterally.  CVS: S1, S2 no murmurs, no S3.Regular rate.  ABD: Soft non tender.   Ext: No edema  MS: Adequate ROM spine, shoulders, hips and knees.  Skin: Intact, no ulcerations or rash noted.  Psych: Good eye contact, normal affect. Memory intact not anxious or depressed appearing.  CNS: CN 2-12 intact, power,  normal  throughout.no focal deficits noted.   Assessment & Plan  Dyslipidemia, goal LDL below 100 Still elevated, I will recommend she throw out the egg yolk when having her eggs,and watch her oil and butter  Overweight (BMI 25.0-29.9) Corrected and at goal following the weight watchers plan she is applauded on this  Vitamin D deficiency Corrected with OTC supplement on average 3 times weekly per pt report  Constipation Less symptomatic with change in diet

## 2018-01-04 NOTE — Patient Instructions (Signed)
Preventive exam in 6 months, call if you need  Me before   CONGRATULATIONS!!!!!  We will set you up for cologuard  Please get rid of egg yolks

## 2018-01-09 ENCOUNTER — Encounter: Payer: Self-pay | Admitting: Family Medicine

## 2018-01-09 NOTE — Assessment & Plan Note (Signed)
Still elevated, I will recommend she throw out the egg yolk when having her eggs,and watch her oil and butter

## 2018-01-09 NOTE — Assessment & Plan Note (Signed)
Less symptomatic with change in diet

## 2018-01-09 NOTE — Assessment & Plan Note (Signed)
Corrected with OTC supplement on average 3 times weekly per pt report

## 2018-01-09 NOTE — Assessment & Plan Note (Signed)
Corrected and at goal following the weight watchers plan she is applauded on this

## 2018-01-18 DIAGNOSIS — Z1211 Encounter for screening for malignant neoplasm of colon: Secondary | ICD-10-CM | POA: Diagnosis not present

## 2018-01-18 LAB — COLOGUARD: COLOGUARD: NEGATIVE

## 2018-01-31 ENCOUNTER — Encounter: Payer: Self-pay | Admitting: Family Medicine

## 2018-05-23 ENCOUNTER — Encounter: Payer: Self-pay | Admitting: Family Medicine

## 2018-05-31 ENCOUNTER — Encounter: Payer: 59 | Admitting: Family Medicine

## 2018-07-07 ENCOUNTER — Encounter: Payer: 59 | Admitting: Family Medicine

## 2018-07-12 DIAGNOSIS — H52221 Regular astigmatism, right eye: Secondary | ICD-10-CM | POA: Diagnosis not present

## 2018-07-12 DIAGNOSIS — H04123 Dry eye syndrome of bilateral lacrimal glands: Secondary | ICD-10-CM | POA: Diagnosis not present

## 2018-07-12 DIAGNOSIS — H524 Presbyopia: Secondary | ICD-10-CM | POA: Diagnosis not present

## 2018-07-12 DIAGNOSIS — H5203 Hypermetropia, bilateral: Secondary | ICD-10-CM | POA: Diagnosis not present

## 2018-08-02 ENCOUNTER — Telehealth: Payer: Self-pay | Admitting: *Deleted

## 2018-08-02 MED ORDER — OSELTAMIVIR PHOSPHATE 75 MG PO CAPS: 75.0000 mg | ORAL_CAPSULE | Freq: Two times a day (BID) | ORAL | 0 refills | Status: DC

## 2018-08-02 MED FILL — OSELTAMIVIR PHOSPHATE 75 MG: 75 | 5 days supply | Qty: 10 | Fill #0

## 2018-08-02 NOTE — Telephone Encounter (Signed)
Med sent and pt aware °

## 2018-08-02 NOTE — Telephone Encounter (Signed)
Pt called in stating husband got sick Sunday. Today he tested positive for Influenza B. She wanted to know if Tamiflu could be called in for her.

## 2018-08-02 NOTE — Telephone Encounter (Signed)
Please advise 

## 2018-08-02 NOTE — Telephone Encounter (Signed)
pls send tamiflu x 5 days let her know , 75 mg twice daily

## 2018-08-31 ENCOUNTER — Encounter: Payer: Self-pay | Admitting: Family Medicine

## 2018-08-31 ENCOUNTER — Other Ambulatory Visit (HOSPITAL_COMMUNITY)
Admission: RE | Admit: 2018-08-31 | Discharge: 2018-08-31 | Disposition: A | Discharge status: Home / Self Care | Payer: No Typology Code available for payment source | Source: Ambulatory Visit | Attending: Family Medicine | Admitting: Family Medicine

## 2018-08-31 ENCOUNTER — Ambulatory Visit (INDEPENDENT_AMBULATORY_CARE_PROVIDER_SITE_OTHER): Payer: No Typology Code available for payment source | Admitting: Family Medicine

## 2018-08-31 VITALS — BP 98/62 | HR 60 | Resp 15 | Ht 63.0 in | Wt 116.1 lb

## 2018-08-31 DIAGNOSIS — L900 Lichen sclerosus et atrophicus: Secondary | ICD-10-CM | POA: Diagnosis not present

## 2018-08-31 DIAGNOSIS — Z0000 Encounter for general adult medical examination without abnormal findings: Secondary | ICD-10-CM | POA: Insufficient documentation

## 2018-08-31 DIAGNOSIS — Z23 Encounter for immunization: Secondary | ICD-10-CM | POA: Diagnosis not present

## 2018-08-31 DIAGNOSIS — Z124 Encounter for screening for malignant neoplasm of cervix: Secondary | ICD-10-CM | POA: Diagnosis not present

## 2018-08-31 NOTE — Assessment & Plan Note (Signed)
After obtaining informed consent, the vaccine is  administered , with no adverse effect noted at the time of administration.  

## 2018-08-31 NOTE — Assessment & Plan Note (Signed)
Erythema noted at 6 o clock at introitus. Record review, pt has used topical steroid and HRT in the past and lubricant for intercourse. Currently recommending Replens as lubricant when needed

## 2018-08-31 NOTE — Patient Instructions (Signed)
Annual exam in 1 year, call if you need me sooner    Nurse visit for Shingrix # 2 in  2 to 4  months  Shingrix #1 today  Pap sent  Labs today CBC, lipid, cmp and eGFr, tSH and vit D  Congrats on excellent health habits, perfect body weight , keep up the great work and continue to enjoy doing it!  Best for 2020!  Thank you  for choosing [facility]. We consider it a privelige to serve you.  Delivering excellent health care in a caring and  compassionate way is our goal.  Partnering with you,  so that together we can achieve this goal is our strategy.

## 2018-08-31 NOTE — Progress Notes (Signed)
    Holly Murphy     MRN: [id number]      DOB: 1960-12-26  HPI: Patient is in for annual physical exam. No other health concerns are expressed or addressed at the visit. . Immunization is reviewed , and  updated .   PE: BP 98/62   Pulse 60   Resp 15   Ht 5\' 3"  (1.6 m)   Wt 116 lb 1.9 oz (52.7 kg)   SpO2 100%   BMI 20.57 kg/m   Pleasant  female, alert and oriented x 3, in no cardio-pulmonary distress. Afebrile. HEENT No facial trauma or asymetry. Sinuses non tender.  Extra occullar muscles intact, External ears normal, tympanic membranes clear. Oropharynx moist, no exudate. Neck: supple, no adenopathy,JVD or thyromegaly.No bruits.  Chest: Clear to ascultation bilaterally.No crackles or wheezes. Non tender to palpation  Breast: No asymetry,no masses or lumps. No tenderness. No nipple discharge or inversion. No axillary or supraclavicular adenopathy  Cardiovascular system; Heart sounds normal,  S1 and  S2 ,no S3.  No murmur, or thrill. Apical beat not displaced Peripheral pulses normal.  Abdomen: Soft, non tender, no organomegaly or masses. No bruits. Bowel sounds normal. No guarding, tenderness or rebound.  .  GU: External genitalia normal female genitalia , normal female distribution of hair. No lesions. Urethral meatus normal in size, no  Prolapse, no lesions visibly  Present. Bladder non tender. Vagina pink and moist , mild erthema at 6 oclock position at 6 o clock position at introitus, no ulceration present , physiologic discharge present . Adequate pelvic support no  cystocele or rectocele noted Cervix pink and appears healthy, no lesions or ulcerations noted, no discharge noted from os Uterus normal size, no adnexal masses, no cervical motion or adnexal tenderness.   Musculoskeletal exam: Full ROM of spine, hips , shoulders and knees. No deformity ,swelling or crepitus noted. No muscle wasting or atrophy.   Neurologic: Cranial nerves 2 to 12  intact. Power, tone ,sensation and reflexes normal throughout. No disturbance in gait. No tremor.  Skin: Intact, no ulceration, erythema , scaling or rash noted. Pigmentation normal throughout  Psych; Normal mood and affect. Judgement and concentration normal   Assessment & Plan:  Lichen sclerosus et atrophicus Erythema noted at 6 o clock at introitus. Record review, pt has used topical steroid and HRT in the past and lubricant for intercourse. Currently recommending Replens as lubricant when needed  Need for zoster vaccination After obtaining informed consent, the vaccine is  administered , with no adverse effect noted at the time of administration.

## 2018-09-01 ENCOUNTER — Encounter: Payer: Self-pay | Admitting: Family Medicine

## 2018-09-01 LAB — CBC
HCT: 39.8 % (ref 35.0–45.0)
Hemoglobin: 13.6 g/dL (ref 11.7–15.5)
MCH: 32.4 pg (ref 27.0–33.0)
MCHC: 34.2 g/dL (ref 32.0–36.0)
MCV: 94.8 fL (ref 80.0–100.0)
MPV: 10.2 fL (ref 7.5–12.5)
Platelets: 271 10*3/uL (ref 140–400)
RBC: 4.2 10*6/uL (ref 3.80–5.10)
RDW: 12.2 % (ref 11.0–15.0)
WBC: 3.3 10*3/uL — ABNORMAL LOW (ref 3.8–10.8)

## 2018-09-01 LAB — COMPLETE METABOLIC PANEL WITH GFR
AG RATIO: 2.2 (calc) (ref 1.0–2.5)
ALT: 10 U/L (ref 6–29)
AST: 16 U/L (ref 10–35)
Albumin: 4.6 g/dL (ref 3.6–5.1)
Alkaline phosphatase (APISO): 52 U/L (ref 37–153)
BUN: 15 mg/dL (ref 7–25)
CO2: 30 mmol/L (ref 20–32)
Calcium: 10 mg/dL (ref 8.6–10.4)
Chloride: 105 mmol/L (ref 98–110)
Creat: 0.66 mg/dL (ref 0.50–1.05)
GFR, Est African American: 115 mL/min/{1.73_m2} (ref >=60)
GFR, Est Non African American: 99 mL/min/{1.73_m2} (ref >=60)
GLOBULIN: 2.1 g/dL (ref 1.9–3.7)
Glucose, Bld: 90 mg/dL (ref 65–99)
Potassium: 4.2 mmol/L (ref 3.5–5.3)
SODIUM: 142 mmol/L (ref 135–146)
Total Bilirubin: 0.5 mg/dL (ref 0.2–1.2)
Total Protein: 6.7 g/dL (ref 6.1–8.1)

## 2018-09-01 LAB — LIPID PANEL
Cholesterol: 200 mg/dL — ABNORMAL HIGH (ref <200)
HDL: 79 mg/dL (ref >=50)
LDL Cholesterol (Calc): 107 mg/dL (calc) — ABNORMAL HIGH
Non-HDL Cholesterol (Calc): 121 mg/dL (calc) (ref <130)
Total CHOL/HDL Ratio: 2.5 (calc) (ref <5.0)
Triglycerides: 47 mg/dL (ref <150)

## 2018-09-01 LAB — VITAMIN D 25 HYDROXY (VIT D DEFICIENCY, FRACTURES): Vit D, 25-Hydroxy: 22 ng/mL — ABNORMAL LOW (ref 30–100)

## 2018-09-01 LAB — TSH: TSH: 0.95 mIU/L

## 2018-09-02 ENCOUNTER — Encounter: Payer: Self-pay | Admitting: Family Medicine

## 2018-09-02 LAB — CYTOLOGY - PAP
Diagnosis: NEGATIVE
HPV: NOT DETECTED

## 2019-02-08 ENCOUNTER — Other Ambulatory Visit (HOSPITAL_COMMUNITY): Payer: Self-pay | Admitting: Family Medicine

## 2019-02-08 DIAGNOSIS — Z1231 Encounter for screening mammogram for malignant neoplasm of breast: Secondary | ICD-10-CM

## 2019-02-17 ENCOUNTER — Encounter: Payer: Self-pay | Admitting: Family Medicine

## 2019-02-17 ENCOUNTER — Other Ambulatory Visit: Payer: Self-pay

## 2019-02-17 ENCOUNTER — Ambulatory Visit (HOSPITAL_COMMUNITY)
Admission: RE | Admit: 2019-02-17 | Discharge: 2019-02-17 | Disposition: A | Discharge status: Home / Self Care | Payer: No Typology Code available for payment source | Source: Ambulatory Visit | Attending: Family Medicine | Admitting: Family Medicine

## 2019-02-17 DIAGNOSIS — Z1231 Encounter for screening mammogram for malignant neoplasm of breast: Secondary | ICD-10-CM | POA: Diagnosis present

## 2019-02-17 IMAGING — MG DIGITAL SCREENING BILATERAL MAMMOGRAM WITH TOMO AND CAD
8 series · 8 of 24 positions shown · non-contrast
Comparison: Previous exam(s).

CLINICAL DATA: Screening.

EXAM:
DIGITAL SCREENING BILATERAL MAMMOGRAM WITH TOMO AND CAD

[R CC synth-2D]
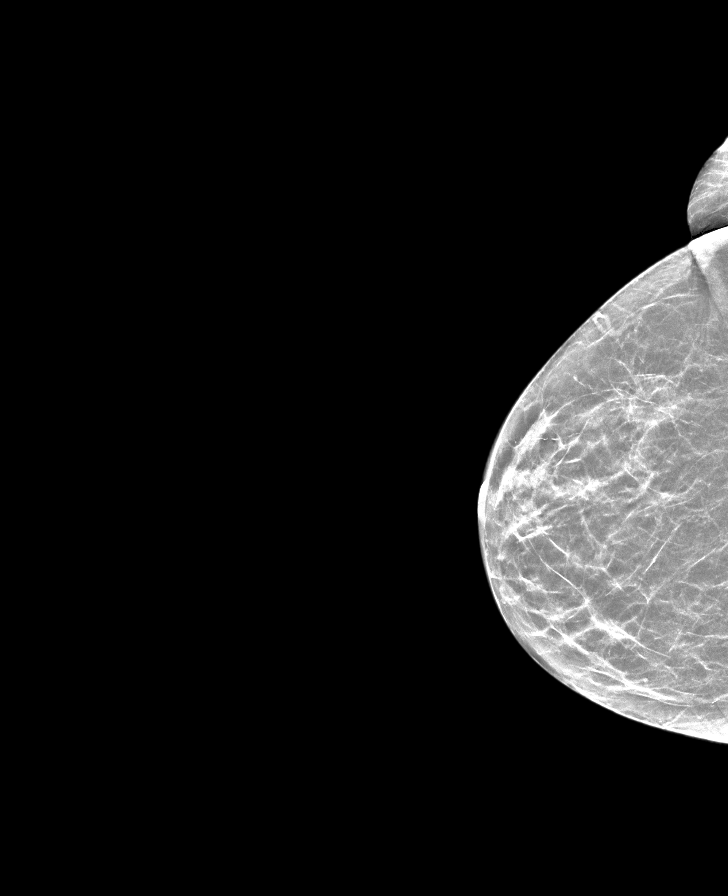

[L MLO synth-2D]
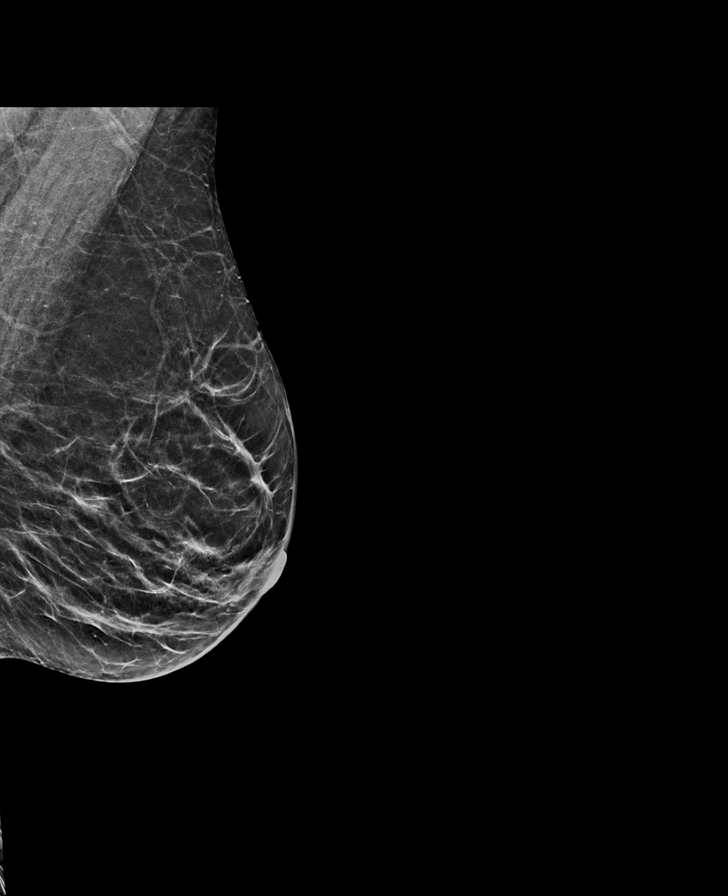

[R MLO synth-2D]
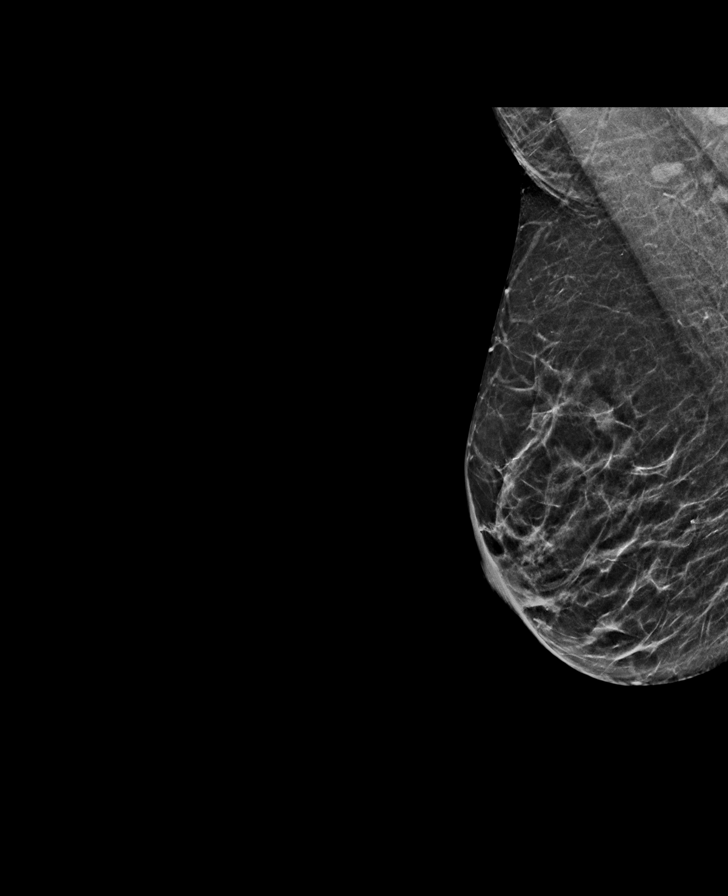

[L CC synth-2D]
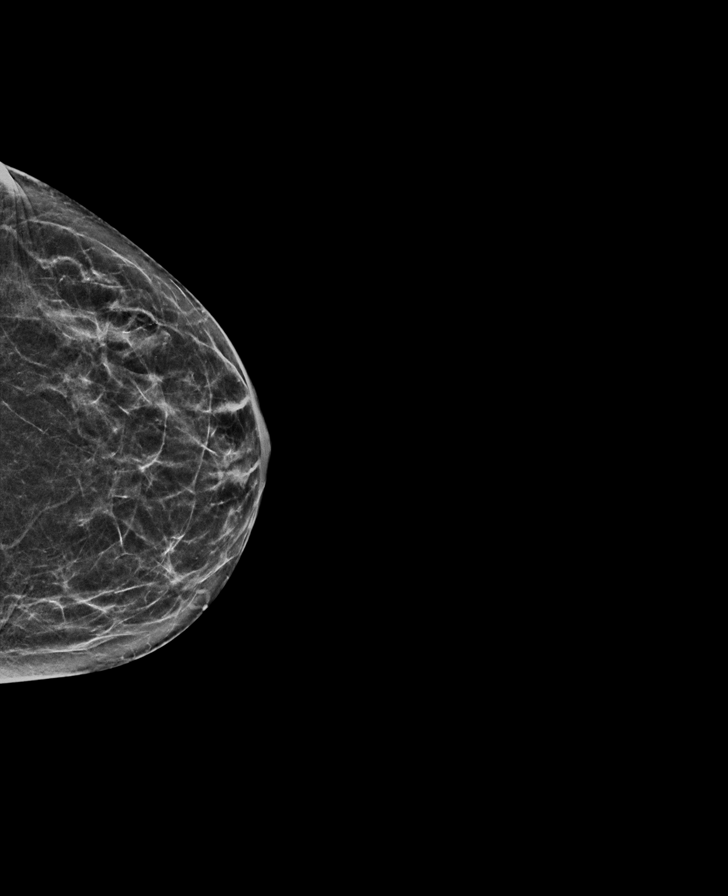

[L CC tomo · tomo slice 27/54.0]
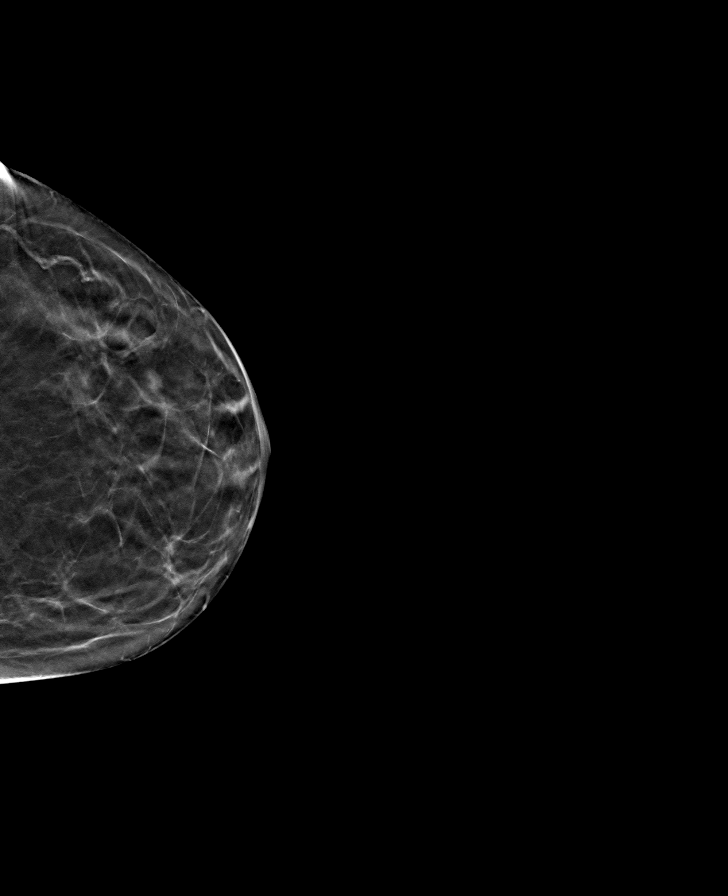

[R MLO tomo · tomo slice 25/49.0]
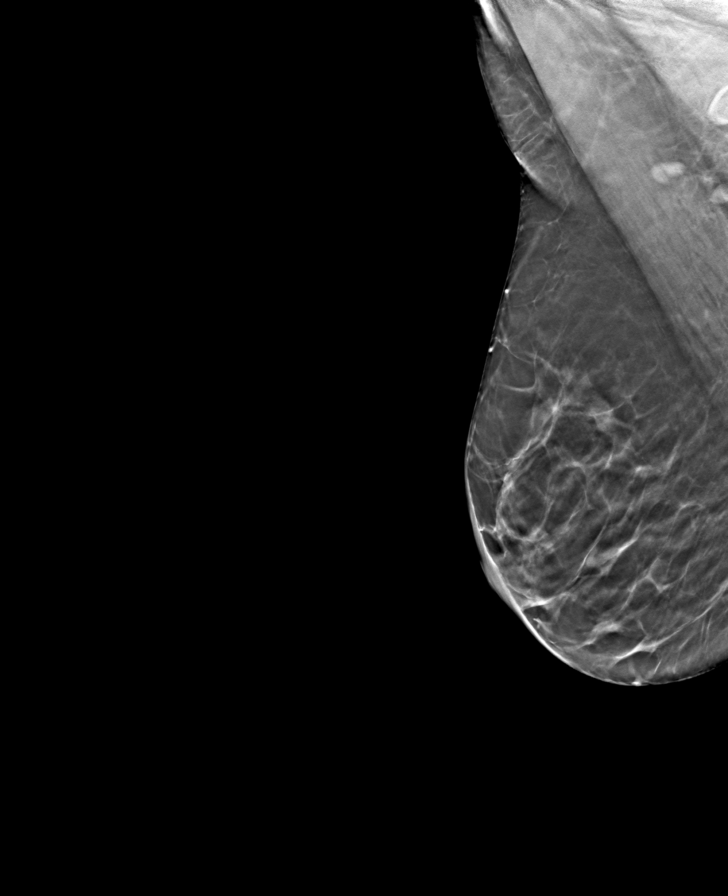

[R CC tomo · tomo slice 25/49.0]
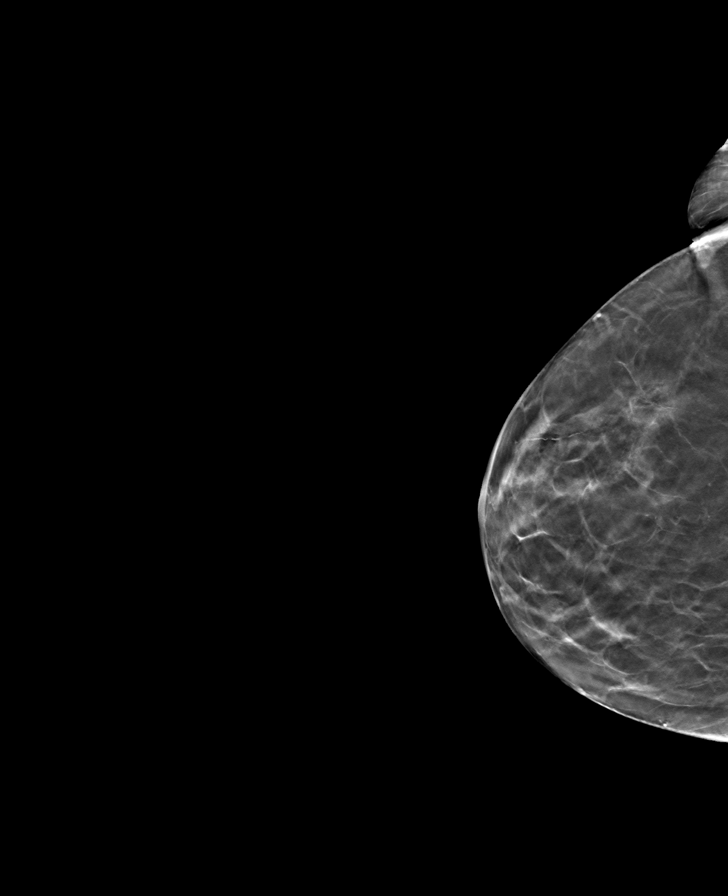

[L MLO tomo · tomo slice 26/51.0]
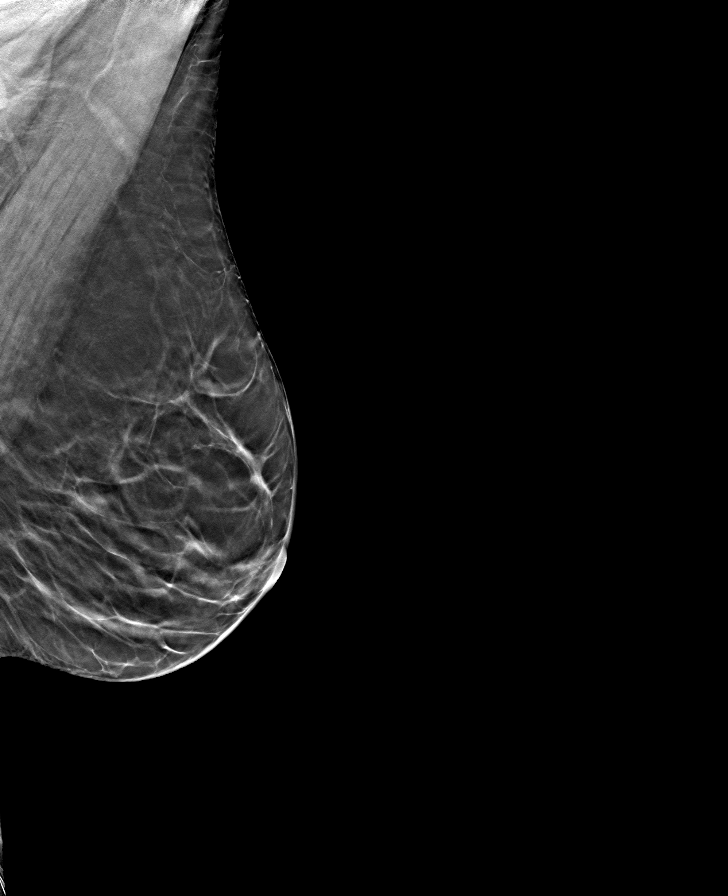

[8 of 24 positions shown; findings below may reference images not displayed]

ACR Breast Density Category b: There are scattered areas of
fibroglandular density.
FINDINGS: There are no findings suspicious for malignancy. Images were
processed with CAD.
IMPRESSION: No mammographic evidence of malignancy. A result letter of this
screening mammogram will be mailed directly to the patient.

RECOMMENDATION:
Screening mammogram in one year. (Code:[TQ])

BI-RADS CATEGORY  1: Negative.

## 2019-08-24 ENCOUNTER — Encounter: Payer: Self-pay | Admitting: Family Medicine

## 2019-08-24 ENCOUNTER — Telehealth: Payer: Self-pay

## 2019-08-24 DIAGNOSIS — E559 Vitamin D deficiency, unspecified: Secondary | ICD-10-CM

## 2019-08-24 DIAGNOSIS — E785 Hyperlipidemia, unspecified: Secondary | ICD-10-CM

## 2019-08-24 NOTE — Telephone Encounter (Signed)
-----  Message from Fayrene Helper, MD sent at 08/24/2019  4:22 PM EST ----- Regarding: labs needed 1 week before her March appt , pls order for [facility] CBC, lipid, cmp and eGFR, TSH and vit D, thanks ( fasting)

## 2019-09-04 ENCOUNTER — Encounter: Payer: No Typology Code available for payment source | Admitting: Family Medicine

## 2019-10-05 ENCOUNTER — Encounter: Payer: No Typology Code available for payment source | Admitting: Family Medicine

## 2020-01-25 ENCOUNTER — Other Ambulatory Visit (HOSPITAL_COMMUNITY): Payer: Self-pay | Admitting: Family Medicine

## 2020-01-25 DIAGNOSIS — Z1231 Encounter for screening mammogram for malignant neoplasm of breast: Secondary | ICD-10-CM

## 2020-02-01 ENCOUNTER — Encounter: Payer: Self-pay | Admitting: Family Medicine

## 2020-02-05 NOTE — Telephone Encounter (Signed)
She is asking for a refill of clobetasol (last refilled by Derrek Monaco)

## 2020-02-06 ENCOUNTER — Other Ambulatory Visit: Payer: Self-pay

## 2020-02-06 MED ORDER — CLOBETASOL PROPIONATE 0.05 % EX CREA: 1.0000 "application " | TOPICAL_CREAM | Freq: Two times a day (BID) | CUTANEOUS | 2 refills | Status: DC

## 2020-02-06 MED FILL — CLOBETASOL 0.05% CREAM: 0.05 | 10 days supply | Qty: 30 | Fill #0

## 2020-02-27 ENCOUNTER — Other Ambulatory Visit: Payer: Self-pay

## 2020-02-27 ENCOUNTER — Ambulatory Visit (HOSPITAL_COMMUNITY)
Admission: RE | Admit: 2020-02-27 | Discharge: 2020-02-27 | Disposition: A | Discharge status: Home / Self Care | Payer: No Typology Code available for payment source | Source: Ambulatory Visit | Attending: Family Medicine | Admitting: Family Medicine

## 2020-02-27 DIAGNOSIS — Z1231 Encounter for screening mammogram for malignant neoplasm of breast: Secondary | ICD-10-CM | POA: Diagnosis present

## 2020-02-27 DIAGNOSIS — E785 Hyperlipidemia, unspecified: Secondary | ICD-10-CM

## 2020-02-27 DIAGNOSIS — E559 Vitamin D deficiency, unspecified: Secondary | ICD-10-CM

## 2020-02-27 LAB — CBC
HCT: 39.6 % (ref 35.0–45.0)
Hemoglobin: 13.5 g/dL (ref 11.7–15.5)
MCH: 31.5 pg (ref 27.0–33.0)
MCHC: 34.1 g/dL (ref 32.0–36.0)
MCV: 92.3 fL (ref 80.0–100.0)
MPV: 10 fL (ref 7.5–12.5)
Platelets: 289 10*3/uL (ref 140–400)
RBC: 4.29 10*6/uL (ref 3.80–5.10)
RDW: 12.3 % (ref 11.0–15.0)
WBC: 3.4 10*3/uL — ABNORMAL LOW (ref 3.8–10.8)

## 2020-02-27 LAB — COMPLETE METABOLIC PANEL WITH GFR
AG Ratio: 2.3 (calc) (ref 1.0–2.5)
ALT: 11 U/L (ref 6–29)
AST: 15 U/L (ref 10–35)
Albumin: 4.6 g/dL (ref 3.6–5.1)
Alkaline phosphatase (APISO): 54 U/L (ref 37–153)
BUN: 13 mg/dL (ref 7–25)
CO2: 28 mmol/L (ref 20–32)
Calcium: 9.7 mg/dL (ref 8.6–10.4)
Chloride: 104 mmol/L (ref 98–110)
Creat: 0.7 mg/dL (ref 0.50–1.05)
GFR, Est African American: 111 mL/min/{1.73_m2} (ref >=60)
GFR, Est Non African American: 96 mL/min/{1.73_m2} (ref >=60)
Globulin: 2 g/dL (calc) (ref 1.9–3.7)
Glucose, Bld: 91 mg/dL (ref 65–99)
Potassium: 4.3 mmol/L (ref 3.5–5.3)
Sodium: 141 mmol/L (ref 135–146)
Total Bilirubin: 0.6 mg/dL (ref 0.2–1.2)
Total Protein: 6.6 g/dL (ref 6.1–8.1)

## 2020-02-27 LAB — LIPID PANEL
Cholesterol: 217 mg/dL — ABNORMAL HIGH (ref <200)
HDL: 87 mg/dL (ref >=50)
LDL Cholesterol (Calc): 115 mg/dL (calc) — ABNORMAL HIGH
Non-HDL Cholesterol (Calc): 130 mg/dL (calc) — ABNORMAL HIGH (ref <130)
Total CHOL/HDL Ratio: 2.5 (calc) (ref <5.0)
Triglycerides: 48 mg/dL (ref <150)

## 2020-02-27 LAB — TSH: TSH: 1.64 mIU/L (ref 0.40–4.50)

## 2020-02-27 LAB — VITAMIN D 25 HYDROXY (VIT D DEFICIENCY, FRACTURES): Vit D, 25-Hydroxy: 16 ng/mL — ABNORMAL LOW (ref 30–100)

## 2020-02-27 IMAGING — MG DIGITAL SCREENING BILAT W/ TOMO W/ CAD
8 series · 9 of 24 positions shown · non-contrast
Comparison: Prior films

CLINICAL DATA: Screening.

EXAM:
DIGITAL SCREENING BILATERAL MAMMOGRAM WITH TOMO AND CAD

[R MLO synth-2D]
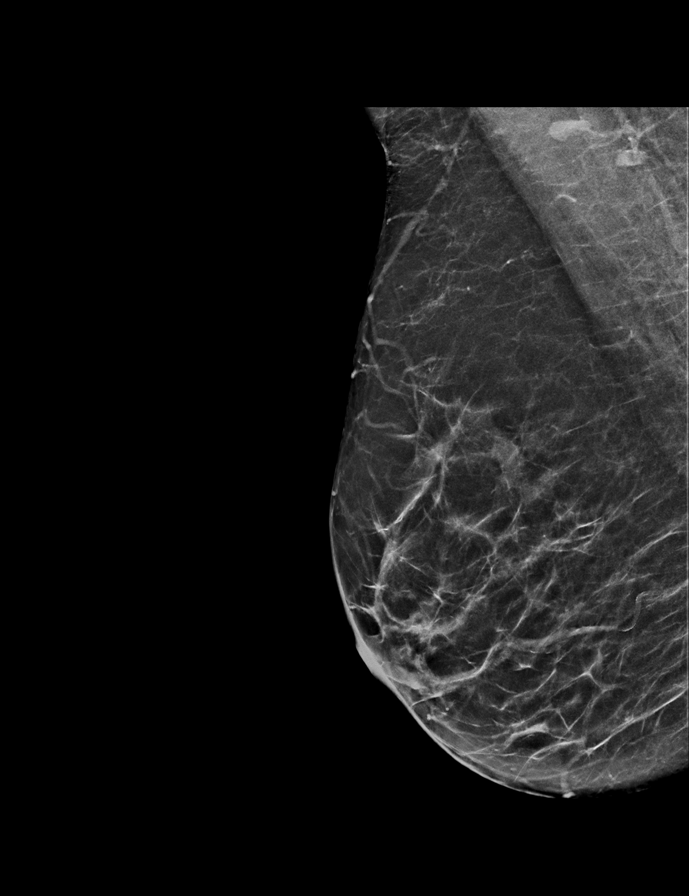

[L CC synth-2D]
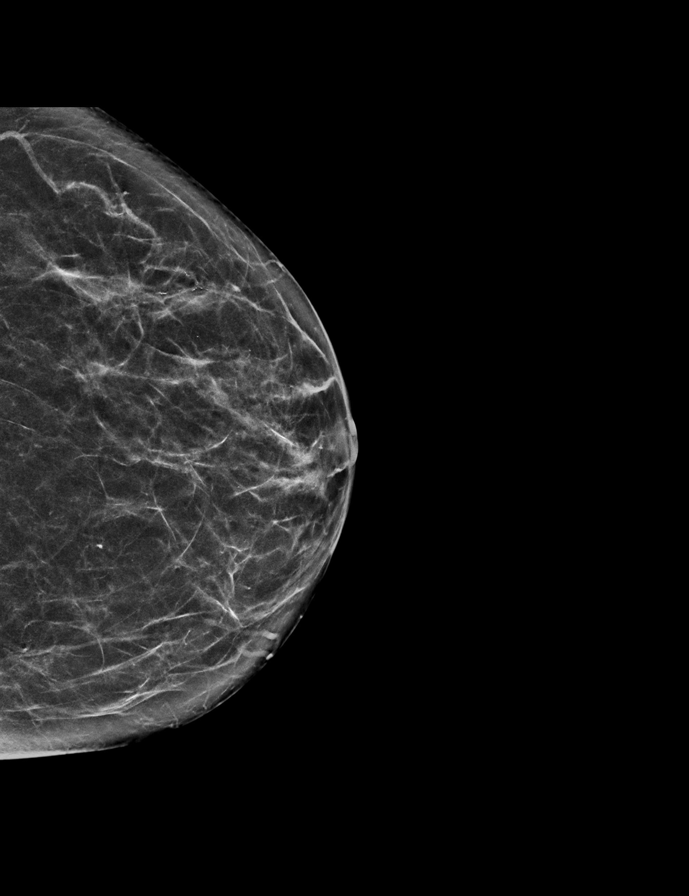

[R CC synth-2D]
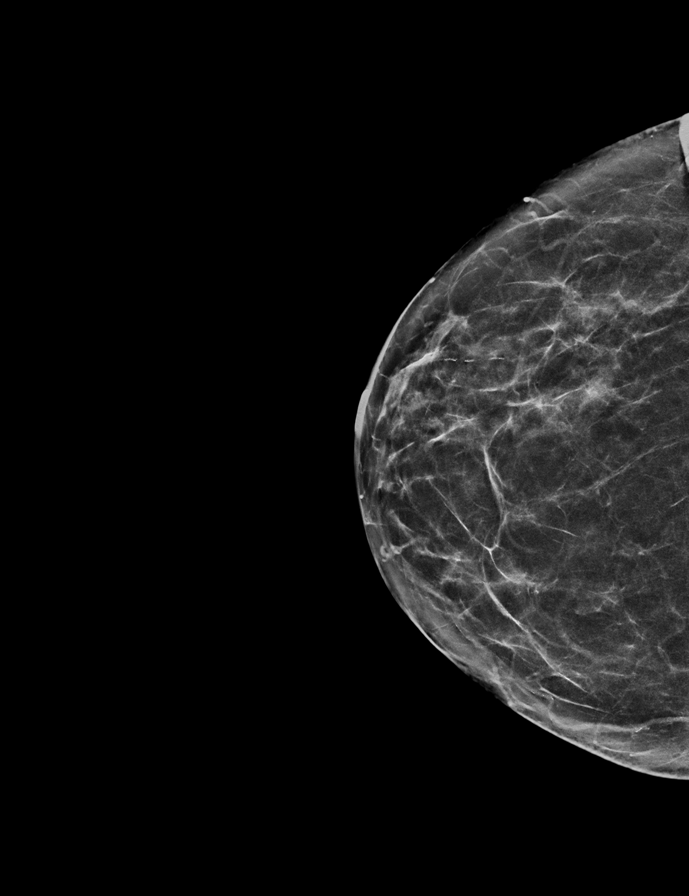

[L MLO synth-2D]
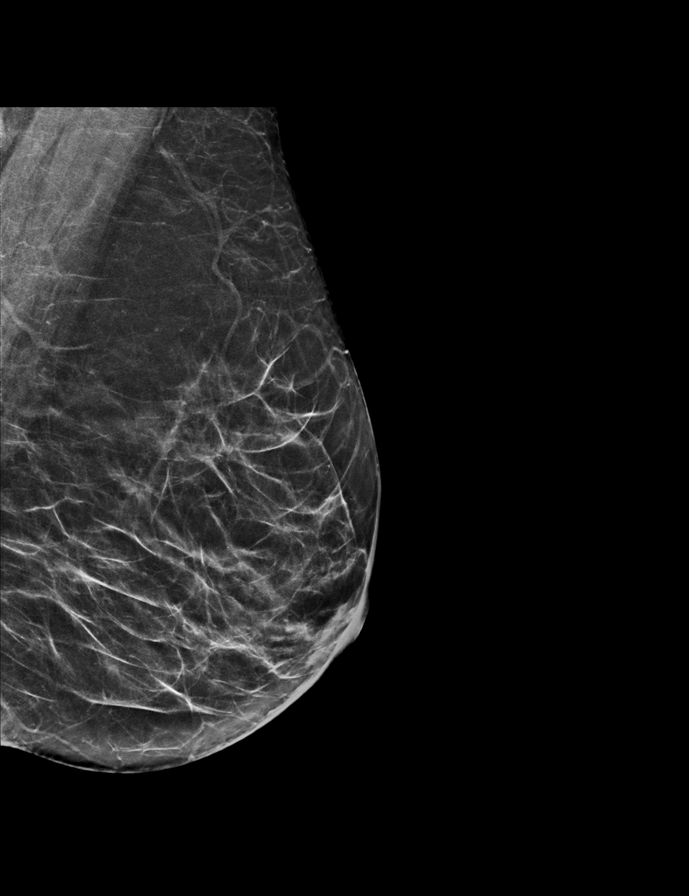

[R CC tomo · 2 of 58 frames shown]
[frame 19/58]
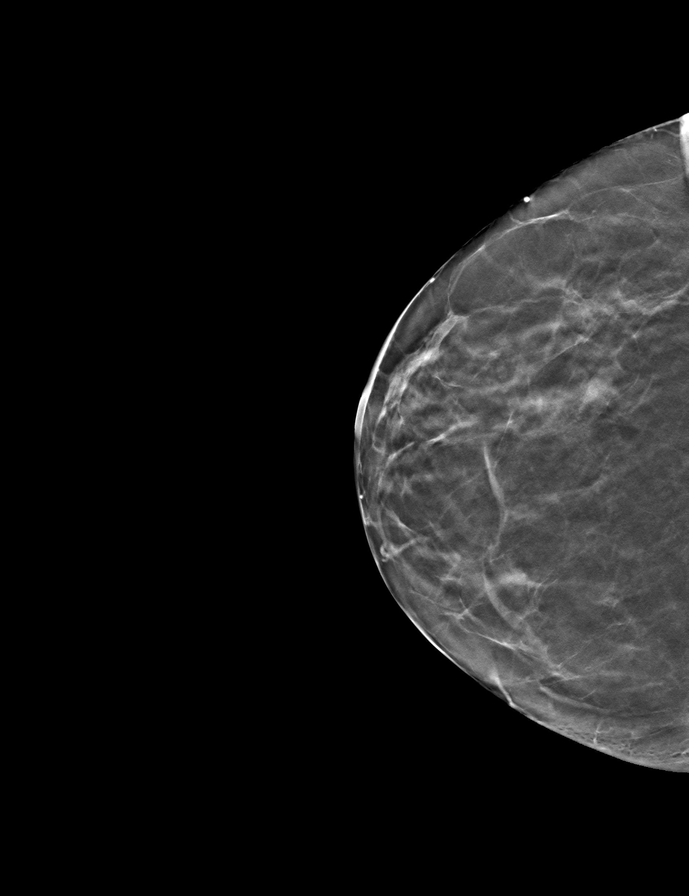
[frame 29/58]
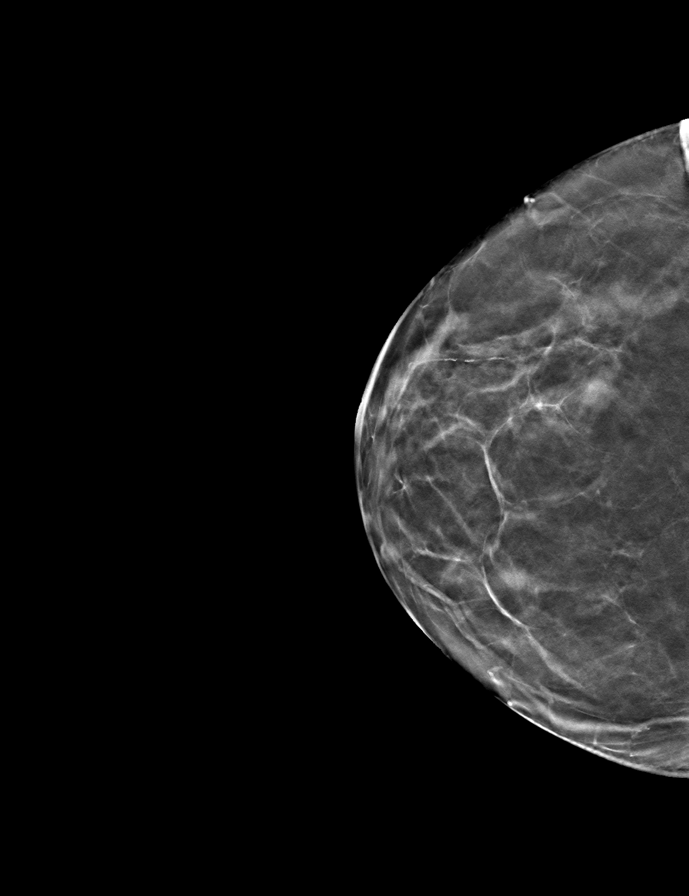

[L MLO tomo · tomo slice 31/61.0]
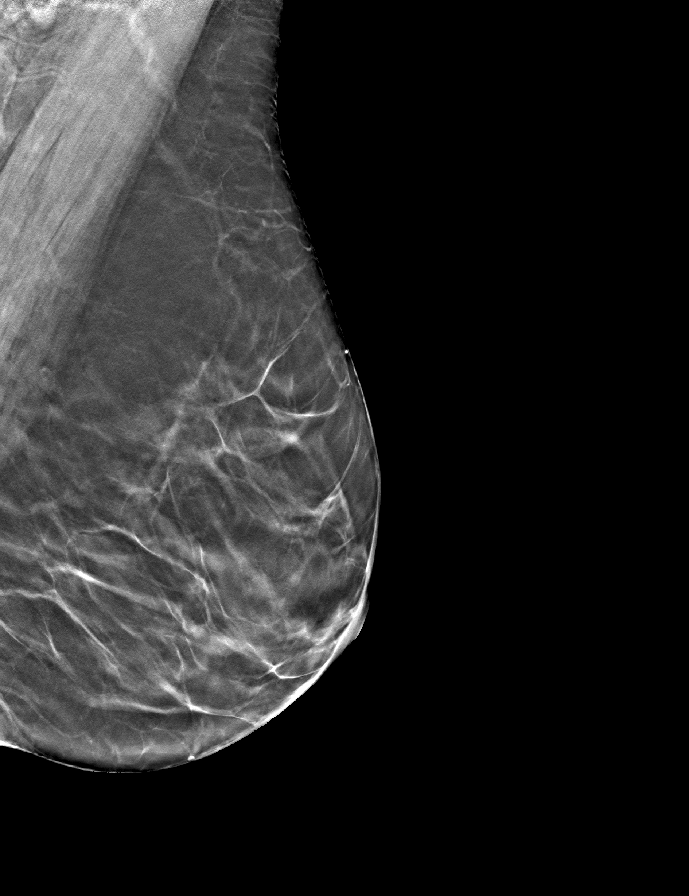

[L CC tomo · tomo slice 29/57.0]
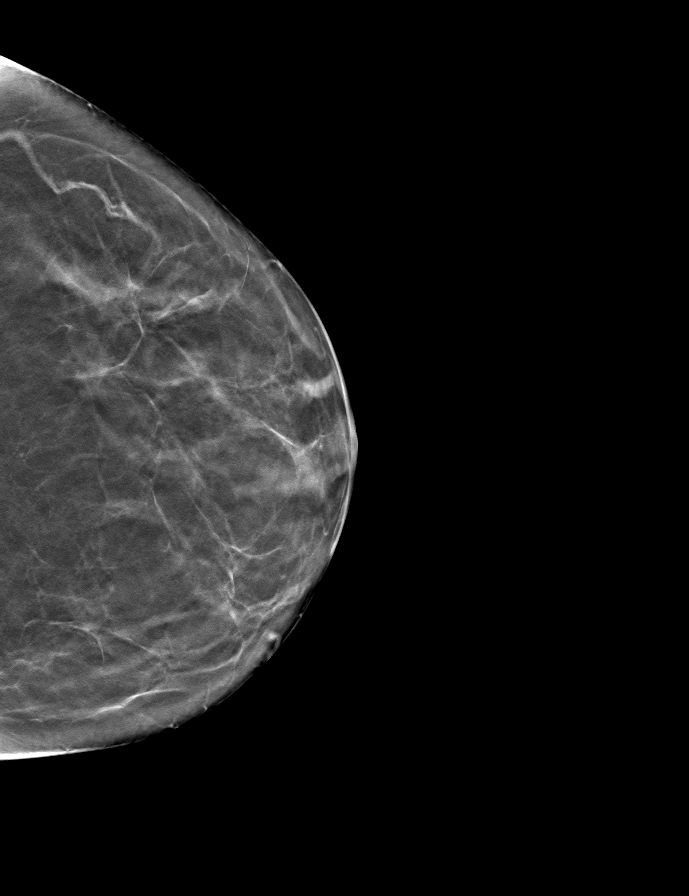

[R MLO tomo · tomo slice 29/57.0]
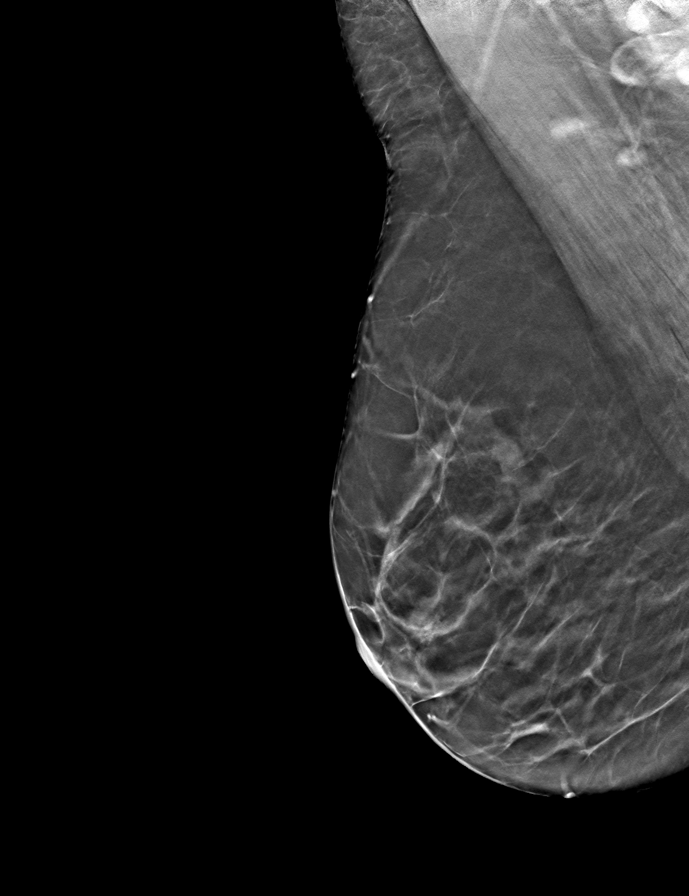

[9 of 24 positions shown; findings below may reference images not displayed]

ACR Breast Density Category b: There are scattered areas of
fibroglandular density.
FINDINGS: In the left breast, calcifications warrant further evaluation. In
the right breast, no findings suspicious for malignancy. Images were
processed with CAD.
IMPRESSION: Further evaluation is suggested for calcifications in the left
breast.

RECOMMENDATION:
Diagnostic mammogram of the left breast. (Code:[7J])

The patient will be contacted regarding the findings, and additional
imaging will be scheduled.

BI-RADS CATEGORY  0: Incomplete. Need additional imaging evaluation
and/or prior mammograms for comparison.

## 2020-02-28 ENCOUNTER — Other Ambulatory Visit (HOSPITAL_COMMUNITY): Payer: Self-pay | Admitting: Family Medicine

## 2020-02-28 DIAGNOSIS — R928 Other abnormal and inconclusive findings on diagnostic imaging of breast: Secondary | ICD-10-CM

## 2020-02-29 ENCOUNTER — Ambulatory Visit (INDEPENDENT_AMBULATORY_CARE_PROVIDER_SITE_OTHER): Payer: No Typology Code available for payment source | Admitting: Family Medicine

## 2020-02-29 ENCOUNTER — Encounter: Payer: Self-pay | Admitting: Family Medicine

## 2020-02-29 ENCOUNTER — Other Ambulatory Visit (HOSPITAL_COMMUNITY): Payer: Self-pay | Admitting: Family Medicine

## 2020-02-29 ENCOUNTER — Other Ambulatory Visit: Payer: Self-pay

## 2020-02-29 ENCOUNTER — Ambulatory Visit (HOSPITAL_COMMUNITY): Payer: No Typology Code available for payment source

## 2020-02-29 VITALS — BP 106/68 | HR 80 | Resp 16 | Ht 63.0 in | Wt 130.0 lb

## 2020-02-29 DIAGNOSIS — Z780 Asymptomatic menopausal state: Secondary | ICD-10-CM | POA: Diagnosis not present

## 2020-02-29 DIAGNOSIS — Z0000 Encounter for general adult medical examination without abnormal findings: Secondary | ICD-10-CM | POA: Diagnosis not present

## 2020-02-29 DIAGNOSIS — R350 Frequency of micturition: Secondary | ICD-10-CM

## 2020-02-29 DIAGNOSIS — M8580 Other specified disorders of bone density and structure, unspecified site: Secondary | ICD-10-CM | POA: Diagnosis not present

## 2020-02-29 MED ORDER — ERGOCALCIFEROL 1.25 MG (50000 UT) PO CAPS: 50000.0000 [IU] | ORAL_CAPSULE | ORAL | 2 refills | Status: DC

## 2020-02-29 MED FILL — VIT D2 1.25 MG (50,000 UNIT: 1.25 MG | 84 days supply | Qty: 12 | Fill #0

## 2020-02-29 NOTE — Patient Instructions (Signed)
F/U in office with MD in late March, call if you need me before  Please commit to increasing exercise for bone health and weight  Commit to calcium 1000 to 1200 mg daily   Please commit to weekly vit D for 9 months  Dexa in March  You are being referred for urinary frequency  It is important that you exercise regularly at least 30 minutes 5 times a week. If you develop chest pain, have severe difficulty breathing, or feel very tired, stop exercising immediately and seek medical attention  Think about what you will eat, plan ahead. Choose " clean, green, fresh or frozen" over canned, processed or packaged foods which are more sugary, salty and fatty. 70 to 75% of food eaten should be vegetables and fruit. Three meals at set times with snacks allowed between meals, but they must be fruit or vegetables. Aim to eat over a 12 hour period , example 7 am to 7 pm, and STOP after  your last meal of the day. Drink water,generally about 64 ounces per day, no other drink is as healthy. Fruit juice is best enjoyed in a healthy way, by EATING the fruit.

## 2020-03-02 ENCOUNTER — Encounter: Payer: Self-pay | Admitting: Family Medicine

## 2020-03-02 DIAGNOSIS — R32 Unspecified urinary incontinence: Secondary | ICD-10-CM | POA: Insufficient documentation

## 2020-03-02 NOTE — Assessment & Plan Note (Signed)
Reports urinary frequency , and urgency , refer to Urology

## 2020-03-02 NOTE — Assessment & Plan Note (Signed)
Update dexa in 2022

## 2020-03-02 NOTE — Assessment & Plan Note (Signed)

## 2020-03-02 NOTE — Progress Notes (Signed)
    Holly Murphy     MRN: [id number]      DOB: 1961/05/15  HPI: Patient is in for annual physical exam. No other health concerns are expressed or addressed at the visit. Recent labs, if available are reviewed. Immunization is reviewed , and  updated if needed.   PE: BP 106/68   Pulse 80   Resp 16   Ht 5\' 3"  (1.6 m)   Wt 130 lb (59 kg)   SpO2 98%   BMI 23.03 kg/m   Pleasant  female, alert and oriented x 3, in no cardio-pulmonary distress. Afebrile. HEENT No facial trauma or asymetry. Sinuses non tender.  Extra occullar muscles intact.. External ears normal, . Neck: supple, no adenopathy,JVD or thyromegaly.No bruits.  Chest: Clear to ascultation bilaterally.No crackles or wheezes. Non tender to palpation  Breast: Not examined  Cardiovascular system; Heart sounds normal,  S1 and  S2 ,no S3.  No murmur, or thrill. Apical beat not displaced Peripheral pulses normal.  Abdomen: Soft, non tender, no organomegaly or masses.    GU: Not eamined  Musculoskeletal exam: Full ROM of spine, hips , shoulders and knees. No deformity ,swelling or crepitus noted. No muscle wasting or atrophy.   Neurologic: Cranial nerves 2 to 12 intact. Power,normal throughout. No disturbance in gait. No tremor.  Skin: Intact, no ulceration, erythema , scaling or rash noted. Pigmentation normal throughout  Psych; Normal mood and affect. Judgement and concentration normal   Assessment & Plan:   Urinary frequency Reports urinary frequency , and urgency , refer to Urology   Osteopenia Update dexa in 2022  Annual physical exam Annual exam as documented. Counseling done  re healthy lifestyle involving commitment to 150 minutes exercise per week, heart healthy diet, and attaining healthy weight.The importance of adequate sleep also discussed. Regular seat belt use and home safety, is also discussed. Changes in health habits are decided on by the patient with goals and time  frames  set for achieving them. Immunization and cancer screening needs are specifically addressed at this visit.

## 2020-03-05 ENCOUNTER — Encounter: Payer: No Typology Code available for payment source | Admitting: Family Medicine

## 2020-03-06 ENCOUNTER — Ambulatory Visit (HOSPITAL_COMMUNITY)
Admission: RE | Admit: 2020-03-06 | Discharge: 2020-03-06 | Disposition: A | Discharge status: Home / Self Care | Payer: No Typology Code available for payment source | Source: Ambulatory Visit | Attending: Family Medicine | Admitting: Family Medicine

## 2020-03-06 ENCOUNTER — Encounter (HOSPITAL_COMMUNITY): Payer: Self-pay

## 2020-03-06 ENCOUNTER — Other Ambulatory Visit: Payer: Self-pay

## 2020-03-06 DIAGNOSIS — R928 Other abnormal and inconclusive findings on diagnostic imaging of breast: Secondary | ICD-10-CM | POA: Insufficient documentation

## 2020-03-06 IMAGING — MG MM DIGITAL DIAGNOSTIC UNILAT*L* W/ TOMO W/ CAD
4 series · 4 of 8 positions shown · non-contrast
Comparison: Previous exams.

CLINICAL DATA: Screening recall for left breast calcifications.

EXAM:
DIGITAL DIAGNOSTIC UNILATERAL LEFT MAMMOGRAM WITH TOMO AND CAD

[L ML]
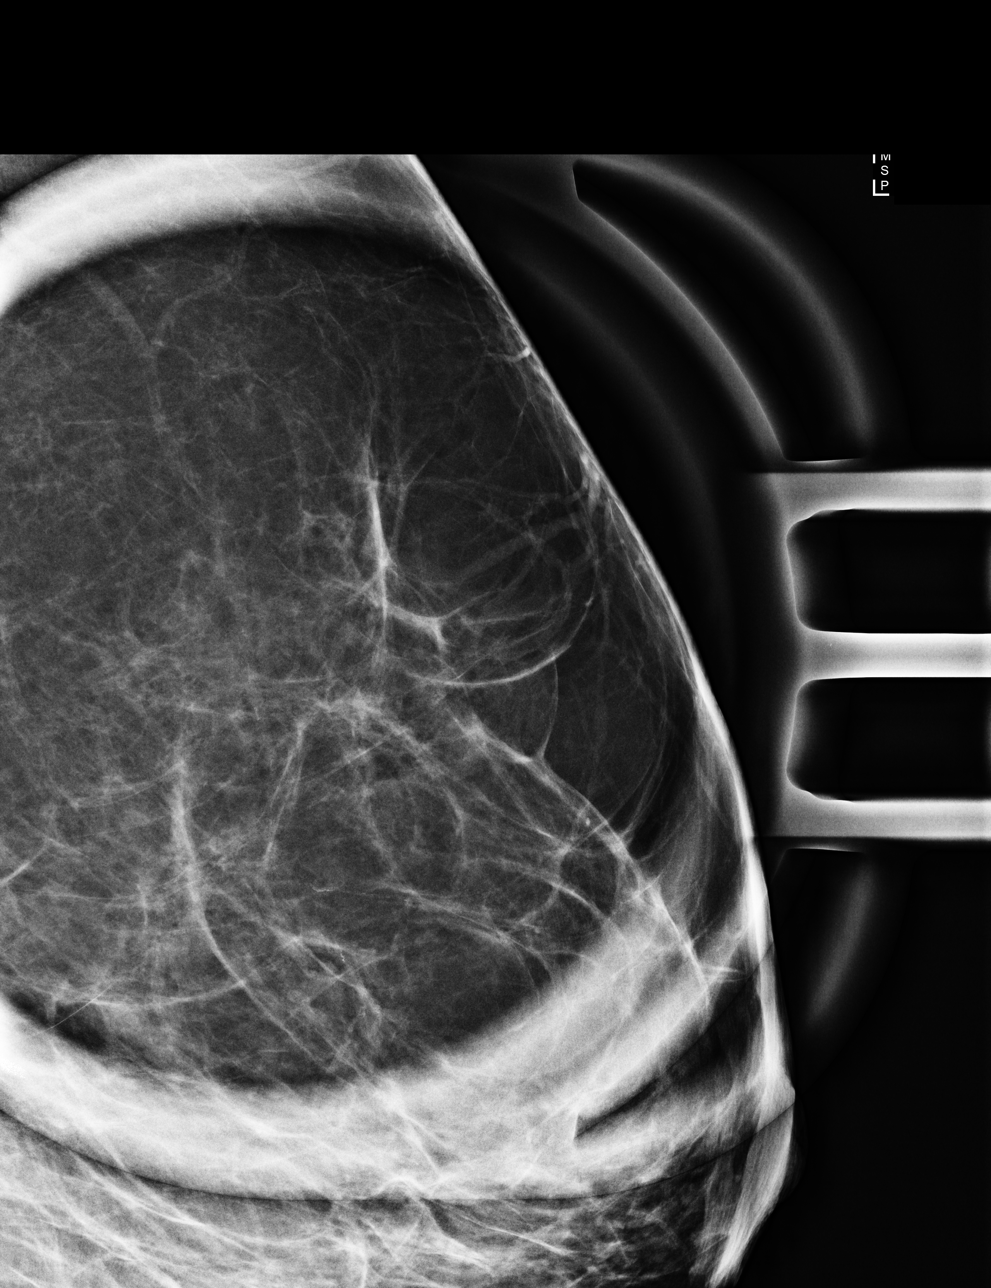

[L CC]
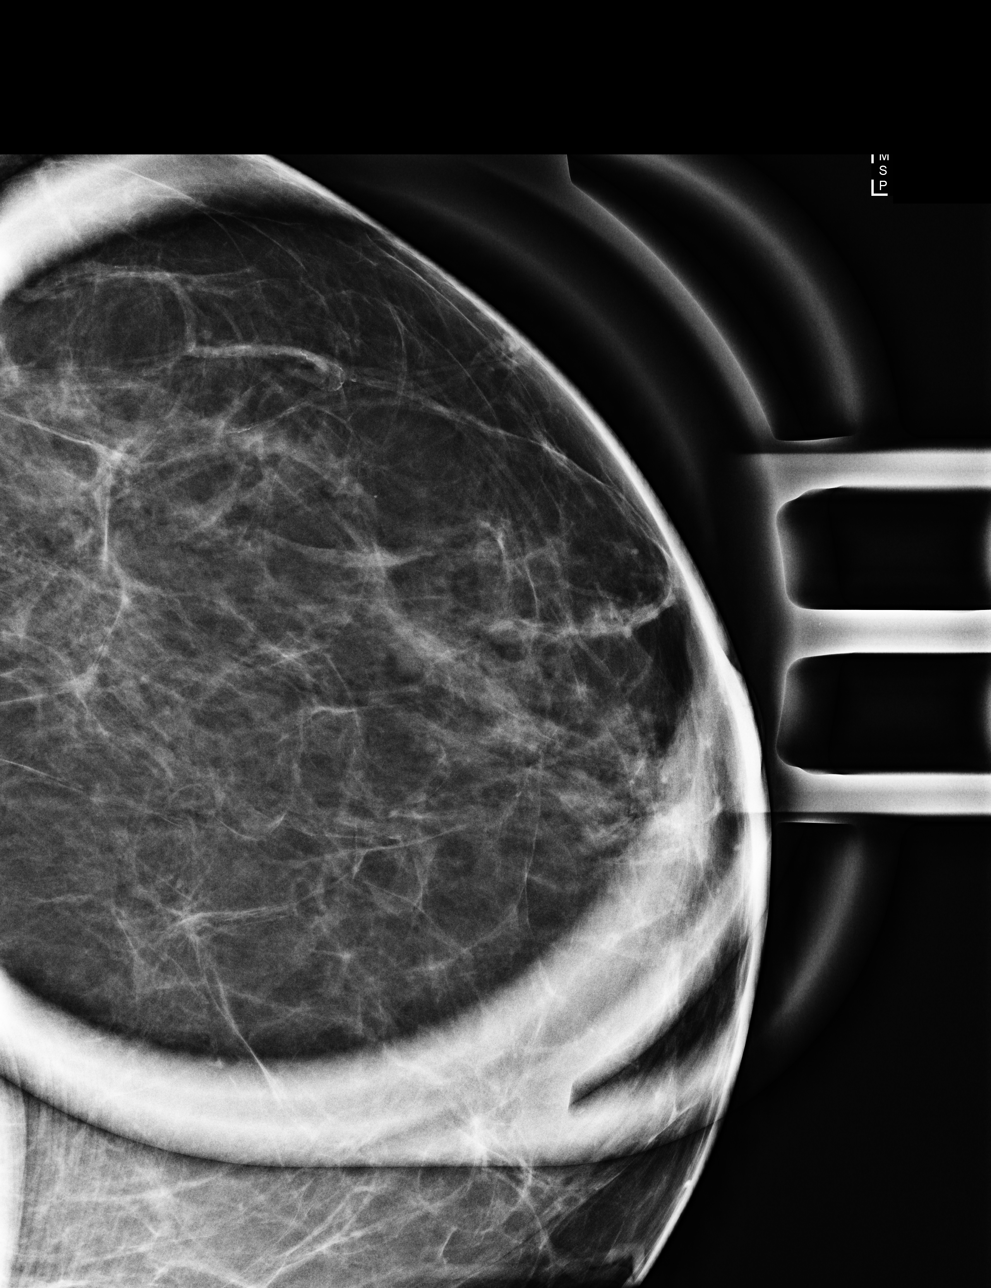

[L ML synth-2D]
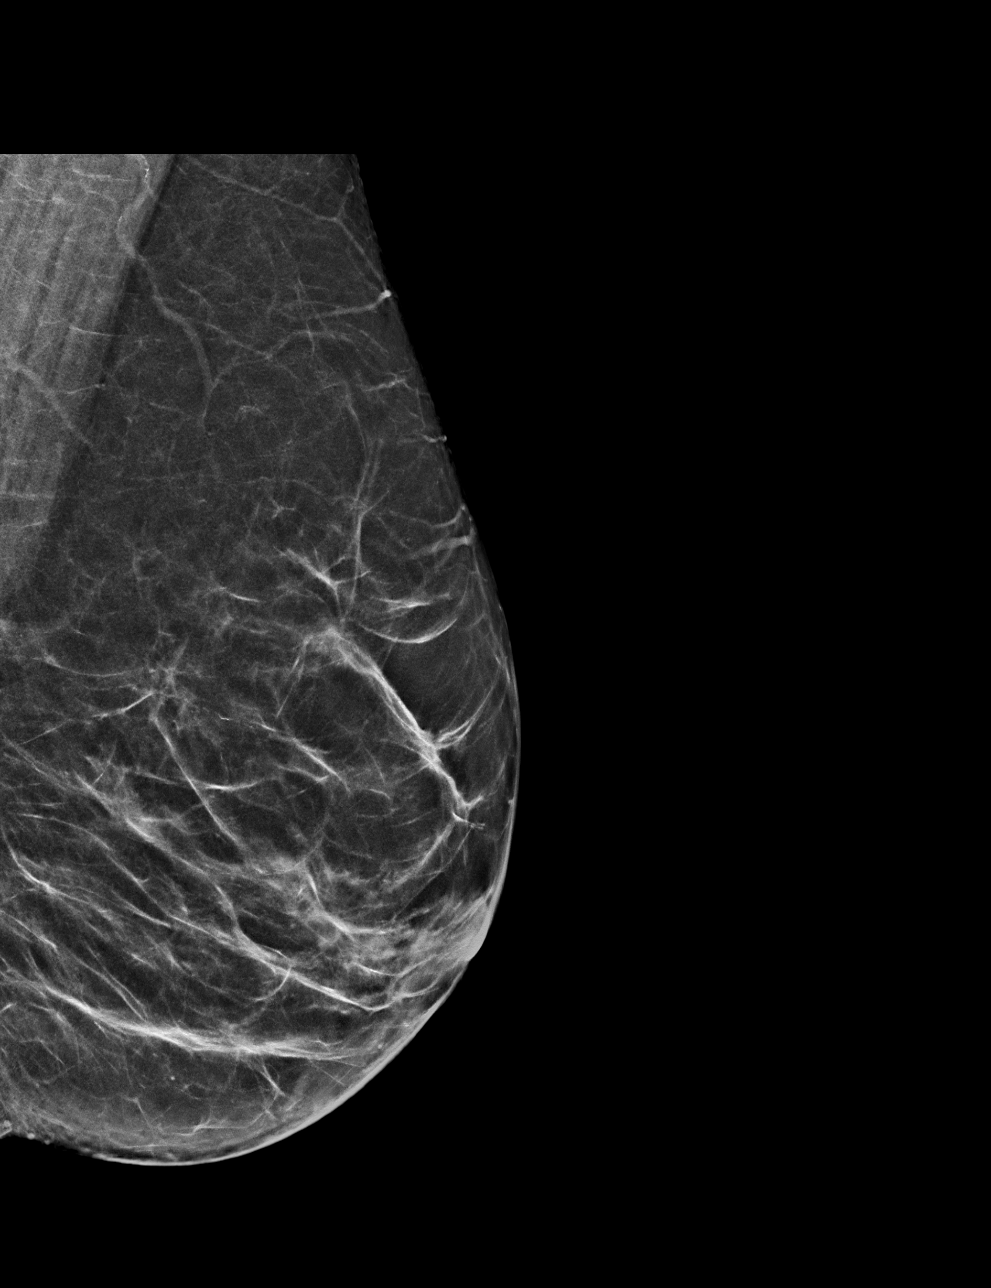

[L ML tomo · tomo slice 29/56.0]
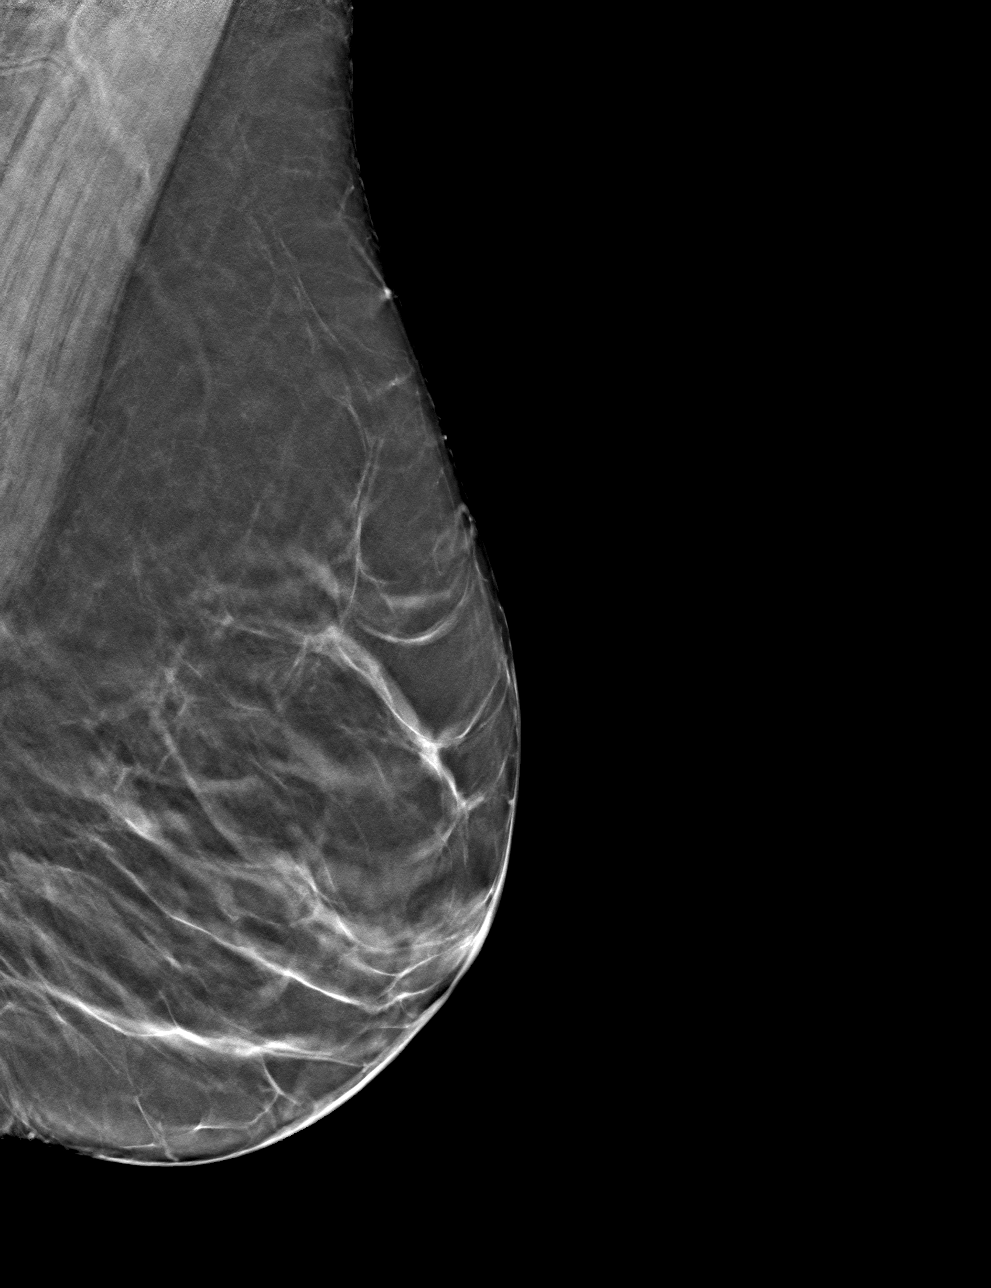

[4 of 8 positions shown; findings below may reference images not displayed]

ACR Breast Density Category b: There are scattered areas of
fibroglandular density.
FINDINGS: Spot compression magnification views were performed over the
upper-outer left breast demonstrating tubular/tram track type
calcifications most consistent with benign vascular calcifications.
There is no mammographic evidence of malignancy in the left breast.

Mammographic images were processed with CAD.
IMPRESSION: No mammographic evidence of malignancy in the left breast.

RECOMMENDATION:
Screening mammogram in one year.(Code:[7C])

I have discussed the findings and recommendations with the patient.
If applicable, a reminder letter will be sent to the patient
regarding the next appointment.

BI-RADS CATEGORY  1: Negative.

## 2020-04-16 ENCOUNTER — Ambulatory Visit (INDEPENDENT_AMBULATORY_CARE_PROVIDER_SITE_OTHER): Payer: No Typology Code available for payment source | Admitting: Urology

## 2020-04-16 ENCOUNTER — Other Ambulatory Visit: Payer: Self-pay

## 2020-04-16 ENCOUNTER — Encounter: Payer: Self-pay | Admitting: Urology

## 2020-04-16 VITALS — BP 103/67 | HR 74 | Temp 97.9°F | Ht 63.0 in | Wt 130.0 lb

## 2020-04-16 DIAGNOSIS — N3281 Overactive bladder: Secondary | ICD-10-CM | POA: Diagnosis not present

## 2020-04-16 LAB — URINALYSIS, ROUTINE W REFLEX MICROSCOPIC
Bilirubin, UA: NEGATIVE
Glucose, UA: NEGATIVE
Ketones, UA: NEGATIVE
Leukocytes,UA: NEGATIVE
Nitrite, UA: NEGATIVE
Protein,UA: NEGATIVE
Specific Gravity, UA: 1.02 (ref 1.005–1.030)
Urobilinogen, Ur: 0.2 mg/dL (ref 0.2–1.0)
pH, UA: 5.5 (ref 5.0–7.5)

## 2020-04-16 LAB — MICROSCOPIC EXAMINATION: Renal Epithel, UA: NONE SEEN /hpf

## 2020-04-16 LAB — BLADDER SCAN AMB NON-IMAGING: Scan Result: 56

## 2020-04-16 NOTE — Progress Notes (Signed)
04/16/2020 3:41 PM   Holly Murphy June 21, 1961 [id number]  Referring provider: Fayrene Helper, MD [address] [place],  [place] [address]  Urinary frequency  HPI: Holly Murphy is a 59yo here for evaluation of urinary frequency. She has had issues with urinary frequency for several years but the frequency has increased over the past 6 months. She has urinary frequency every 2 hours. She has nocturia 3x. She has rare suprapubic pain/spasms at the end of urination (once every couple of weeks). She drinks 4 sun drop sodas a day. She does not drink water.    PMH: Past Medical History:  Diagnosis Date   Depression 06/2009   Hearing loss    Lichen sclerosus et atrophicus 07/26/2015   Perimenopausal    Postmenopausal HRT (hormone replacement therapy) 08/29/2015   Seasonal allergies    Situational stress    with Mom's deterioating health . Off celexa for at least 2 months    Vaginal atrophy 07/26/2015    Surgical History: Past Surgical History:  Procedure Laterality Date   COLONOSCOPY N/A 05/24/2013   Procedure: COLONOSCOPY;  Surgeon: Rogene Houston, MD;  Location: [facility];  Service: Endoscopy;  Laterality: N/A;  930    Home Medications:  Allergies as of 04/16/2020   No Known Allergies     Medication List       Accurate as of April 16, 2020  3:41 PM. If you have any questions, ask your nurse or doctor.        CALCIUM 500 PO Take by mouth.   clobetasol cream 0.05 % Commonly known as: TEMOVATE Apply 1 application topically 2 (two) times daily.   ergocalciferol 1.25 MG (50000 UT) capsule Commonly known as: VITAMIN D2 Take 1 capsule (50,000 Units total) by mouth once a week. One capsule once weekly       Allergies: No Known Allergies  Family History: Family History  Problem Relation Age of Onset   Diabetes Mother    Heart attack Mother    Hypertension Father    Cancer Maternal Grandmother        uterine   Heart attack  Maternal Grandmother    Heart attack Maternal Grandfather    Other Paternal Grandmother        brain tumor   Heart attack Paternal Grandfather    Cancer Other        family history    Diabetes Other        family history    Heart defect Other        family history    Arthritis Other        family history     Social History:  reports that she has never smoked. She has never used smokeless tobacco. She reports that she does not drink alcohol and does not use drugs.  ROS: All other review of systems were reviewed and are negative except what is noted above in HPI  Physical Exam: BP 103/67    Pulse 74    Temp 97.9 F (36.6 C)    Ht 5\' 3"  (1.6 m)    Wt 130 lb (59 kg)    BMI 23.03 kg/m   Constitutional:  Alert and oriented, No acute distress. HEENT: [place] AT, moist mucus membranes.  Trachea midline, no masses. Cardiovascular: No clubbing, cyanosis, or edema. Respiratory: Normal respiratory effort, no increased work of breathing. GI: Abdomen is soft, nontender, nondistended, no abdominal masses GU: No CVA tenderness.  Lymph:  No cervical or inguinal lymphadenopathy. Skin: No rashes, bruises or suspicious lesions. Neurologic: Grossly intact, no focal deficits, moving all 4 extremities. Psychiatric: Normal mood and affect.  Laboratory Data: Lab Results  Component Value Date   WBC 3.4 (L) 02/27/2020   HGB 13.5 02/27/2020   HCT 39.6 02/27/2020   MCV 92.3 02/27/2020   PLT 289 02/27/2020    Lab Results  Component Value Date   CREATININE 0.70 02/27/2020    No results found for: PSA  No results found for: TESTOSTERONE  No results found for: HGBA1C  Urinalysis No results found for: COLORURINE, APPEARANCEUR, LABSPEC, PHURINE, GLUCOSEU, HGBUR, BILIRUBINUR, KETONESUR, PROTEINUR, UROBILINOGEN, NITRITE, LEUKOCYTESUR  No results found for: LABMICR, Peter, RBCUA, LABEPIT, MUCUS, BACTERIA  Pertinent Imaging:  No results found for this or any previous visit.  No results  found for this or any previous visit.  No results found for this or any previous visit.  No results found for this or any previous visit.  No results found for this or any previous visit.  No results found for this or any previous visit.  No results found for this or any previous visit.  No results found for this or any previous visit.   Assessment & Plan:    1. OAB (overactive bladder) -Patient instructed to decrease her soda consumption and substitute water especially after 3pm - BLADDER SCAN AMB NON-IMAGING - Urinalysis, Routine w reflex microscopic -Patient was given sample of mirabegron    No follow-ups on file.  Nicolette Bang, MD  [facility] [place]

## 2020-04-16 NOTE — Progress Notes (Signed)
Urological Symptom Review  Patient is experiencing the following symptoms: Frequent urination Hard to postpone urination Get up at night to urinate   Review of Systems  Gastrointestinal (upper)  : Negative for upper GI symptoms  Gastrointestinal (lower) : Negative for lower GI symptoms  Constitutional : Negative for symptoms  Skin: Negative for skin symptoms  Eyes: Negative for eye symptoms  Ear/Nose/Throat : Negative for Ear/Nose/Throat symptoms  Hematologic/Lymphatic: Negative for Hematologic/Lymphatic symptoms  Cardiovascular : Negative for cardiovascular symptoms  Respiratory : Negative for respiratory symptoms  Endocrine: Negative for endocrine symptoms  Musculoskeletal: Back pain  Neurological: Negative for neurological symptoms  Psychologic: Negative for psychiatric symptoms

## 2020-04-16 NOTE — Patient Instructions (Signed)

## 2020-05-24 ENCOUNTER — Other Ambulatory Visit: Payer: Self-pay

## 2020-05-24 ENCOUNTER — Emergency Department (HOSPITAL_COMMUNITY): Payer: No Typology Code available for payment source

## 2020-05-24 ENCOUNTER — Inpatient Hospital Stay (HOSPITAL_COMMUNITY)
Admission: EM | Admit: 2020-05-24 | Discharge: 2020-05-30 | DRG: 066 | Disposition: A | Discharge status: Home / Self Care | Payer: No Typology Code available for payment source | Attending: Neurosurgery | Admitting: Neurosurgery

## 2020-05-24 ENCOUNTER — Encounter (HOSPITAL_COMMUNITY): Payer: Self-pay | Admitting: Emergency Medicine

## 2020-05-24 DIAGNOSIS — F32A Depression, unspecified: Secondary | ICD-10-CM | POA: Diagnosis present

## 2020-05-24 DIAGNOSIS — E785 Hyperlipidemia, unspecified: Secondary | ICD-10-CM | POA: Diagnosis present

## 2020-05-24 DIAGNOSIS — I609 Nontraumatic subarachnoid hemorrhage, unspecified: Principal | ICD-10-CM

## 2020-05-24 DIAGNOSIS — Z79899 Other long term (current) drug therapy: Secondary | ICD-10-CM

## 2020-05-24 DIAGNOSIS — Z20822 Contact with and (suspected) exposure to covid-19: Secondary | ICD-10-CM | POA: Diagnosis present

## 2020-05-24 LAB — COMPREHENSIVE METABOLIC PANEL
ALT: 15 U/L (ref 0–44)
AST: 18 U/L (ref 15–41)
Albumin: 4.3 g/dL (ref 3.5–5.0)
Alkaline Phosphatase: 53 U/L (ref 38–126)
Anion gap: 10 (ref 5–15)
BUN: 14 mg/dL (ref 6–20)
CO2: 26 mmol/L (ref 22–32)
Calcium: 10 mg/dL (ref 8.9–10.3)
Chloride: 104 mmol/L (ref 98–111)
Creatinine, Ser: 0.65 mg/dL (ref 0.44–1.00)
GFR, Estimated: >60 mL/min (ref >60)
Glucose, Bld: 147 mg/dL — ABNORMAL HIGH (ref 70–99)
Potassium: 3.5 mmol/L (ref 3.5–5.1)
Sodium: 140 mmol/L (ref 135–145)
Total Bilirubin: 0.8 mg/dL (ref 0.3–1.2)
Total Protein: 6.9 g/dL (ref 6.5–8.1)

## 2020-05-24 LAB — CBC WITH DIFFERENTIAL/PLATELET
Abs Immature Granulocytes: 0.05 10*3/uL (ref 0.00–0.07)
Basophils Absolute: 0 10*3/uL (ref 0.0–0.1)
Basophils Relative: 1 %
Eosinophils Absolute: 0.1 10*3/uL (ref 0.0–0.5)
Eosinophils Relative: 1 %
HCT: 42.5 % (ref 36.0–46.0)
Hemoglobin: 14 g/dL (ref 12.0–15.0)
Immature Granulocytes: 1 %
Lymphocytes Relative: 36 %
Lymphs Abs: 1.8 10*3/uL (ref 0.7–4.0)
MCH: 31.5 pg (ref 26.0–34.0)
MCHC: 32.9 g/dL (ref 30.0–36.0)
MCV: 95.5 fL (ref 80.0–100.0)
Monocytes Absolute: 0.4 10*3/uL (ref 0.1–1.0)
Monocytes Relative: 8 %
Neutro Abs: 2.6 10*3/uL (ref 1.7–7.7)
Neutrophils Relative %: 53 %
Platelets: 297 10*3/uL (ref 150–400)
RBC: 4.45 MIL/uL (ref 3.87–5.11)
RDW: 12.1 % (ref 11.5–15.5)
WBC: 4.9 10*3/uL (ref 4.0–10.5)
nRBC: 0 % (ref 0.0–0.2)

## 2020-05-24 LAB — RAPID URINE DRUG SCREEN, HOSP PERFORMED
Amphetamines: NOT DETECTED
Barbiturates: NOT DETECTED
Benzodiazepines: NOT DETECTED
Cocaine: NOT DETECTED
Opiates: NOT DETECTED
Tetrahydrocannabinol: NOT DETECTED

## 2020-05-24 LAB — RESP PANEL BY RT PCR (RSV, FLU A&B, COVID)
Influenza A by PCR: NEGATIVE
Influenza B by PCR: NEGATIVE
Respiratory Syncytial Virus by PCR: NEGATIVE
SARS Coronavirus 2 by RT PCR: NEGATIVE

## 2020-05-24 LAB — MRSA PCR SCREENING: MRSA by PCR: NEGATIVE

## 2020-05-24 IMAGING — CT CT ANGIO HEAD
1 of 11 series · 5 of 33 positions shown · IV contrast (OMNI 350)
Comparison: Carotid artery duplex [DATE].
COMPARISON: Carotid artery duplex [DATE].

Addendum:
CLINICAL DATA: Neck pain, saw chiropractor recently for adjustment.
Additional provided: Patient reports stretching neck wall working
with sudden onset of sharp pain shooting to top of head, hearing
became muffled, nausea.

EXAM:
CT ANGIOGRAPHY HEAD AND NECK
TECHNIQUE: Multidetector CT imaging of the head and neck was performed using
the standard protocol during bolus administration of intravenous
contrast. Multiplanar CT image reconstructions and MIPs were
obtained to evaluate the vascular anatomy. Carotid stenosis
measurements (when applicable) are obtained utilizing NASCET
criteria, using the distal internal carotid diameter as the
denominator.
CONTRAST:  75mL OMNIPAQUE IOHEXOL 350 MG/ML SOLN

[Series 13: cta neck axial · axial · 0.39mm/px · z∈[-328,-84]mm · 5 of 367 slices shown]
[im 62/367  soft-tissue]
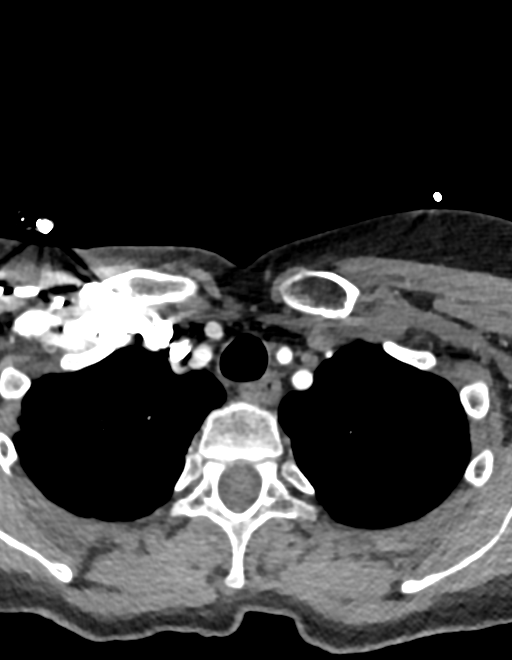
[im 123/367  bone]
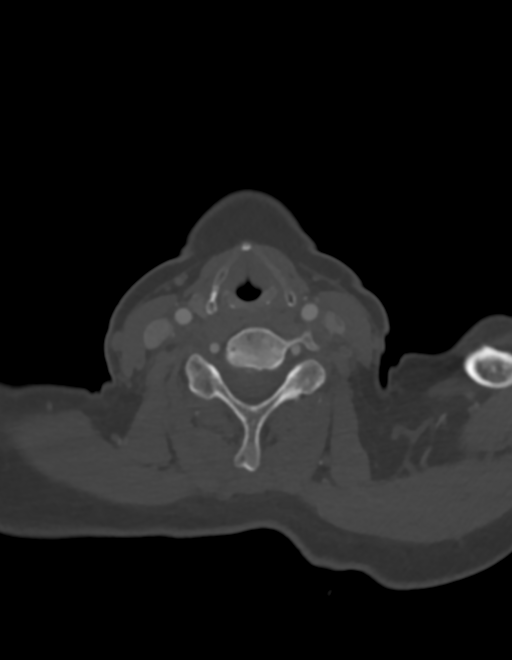
[im 184/367  soft-tissue]
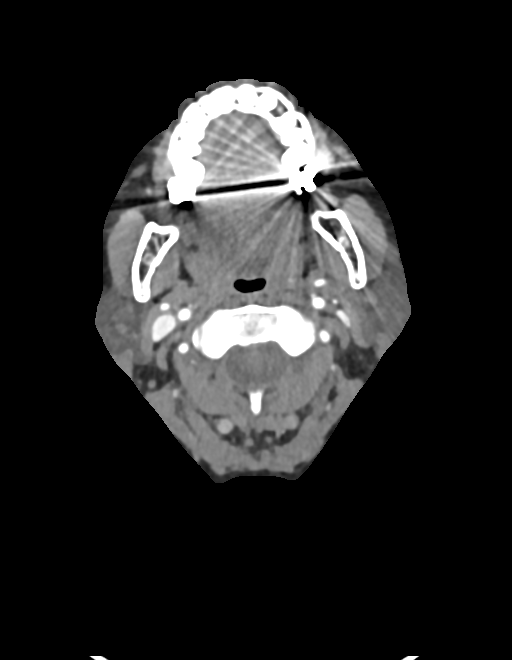
[im 245/367  bone]
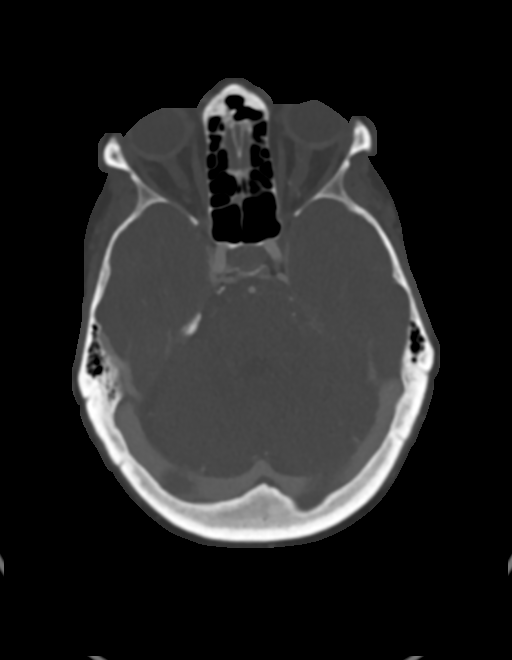
[im 306/367  soft-tissue]
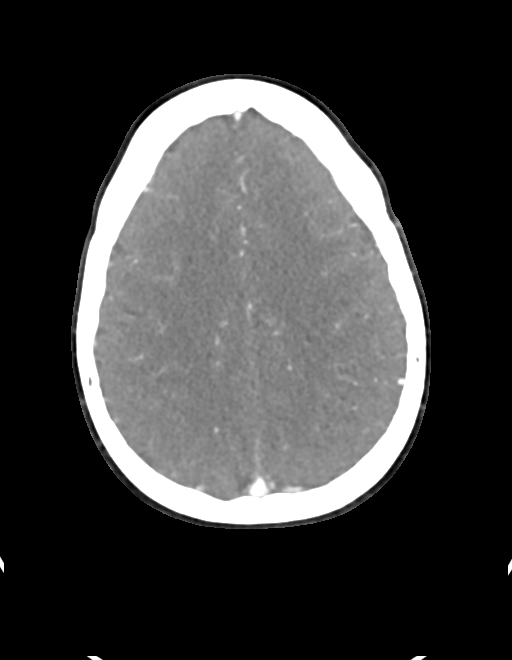

[5 of 33 positions shown; findings below may reference images not displayed]

FINDINGS: CT HEAD FINDINGS

Brain:

Cerebral volume is normal.

There is small volume acute subarachnoid hemorrhage greatest within
the prepontine cistern, left ambient cistern, and also along the
left lateral aspect of the lower pons and medulla.

No demarcated cortical infarct.

No evidence of intracranial mass.

No hydrocephalus or midline shift.

Vascular: Reported below.

Skull: Normal. Negative for fracture or focal lesion.

Sinuses: No significant paranasal sinus disease.

Orbits: No mass or acute finding.

Review of the MIP images confirms the above findings

CTA NECK FINDINGS

Aortic arch: Standard aortic branching. The visualized aortic arch
is unremarkable. No hemodynamically significant innominate or
proximal subclavian artery stenosis.

Right carotid system: CCA and ICA patent within the neck without
stenosis. Minimal soft plaque within the carotid bifurcation.
Suspected small carotid web within the posterior aspect of the
carotid bulb (series 15, image 71).

Left carotid system: CCA and ICA patent within the neck without
stenosis. No significant atherosclerotic disease.

Vertebral arteries: Codominant and patent within the neck without
stenosis.

Skeleton: No acute bony abnormality or aggressive osseous lesion.
Cervical spondylosis greatest at C5-C6 and C6-C7.

Other neck: No neck mass or cervical lymphadenopathy.

Upper chest: No consolidation within the imaged lung apices.

Review of the MIP images confirms the above findings

CTA HEAD FINDINGS

Anterior circulation:

The intracranial internal carotid arteries are patent. The M1 middle
cerebral arteries are patent without significant stenosis. No M2
proximal branch occlusion or high-grade proximal stenosis is
identified. The anterior cerebral arteries are patent. No
intracranial aneurysm is identified.

Posterior circulation:

The intracranial vertebral arteries are patent. The basilar artery
is patent. The posterior cerebral arteries are patent. Posterior
communicating arteries are present bilaterally. No intracranial
aneurysm is identified.

Venous sinuses: Within limitations of contrast timing, no convincing
thrombus.

Anatomic variants: None significant

Review of the MIP images confirms the above findings
IMPRESSION: CT head:

Small-volume acute subarachnoid hemorrhage most notably within the
prepontine cistern, left ambient cistern and along the left lateral
aspect of the lower pons and medulla.

CTA neck:

1. The common carotid, internal carotid and vertebral arteries are
patent within the neck without stenosis.
2. Suspected small carotid web within the right carotid bulb.
3. Minimal soft plaque within the right carotid bifurcation.

CTA head:

1. No intracranial aneurysm is identified. However, the pattern of
subarachnoid hemorrhage remains suspicious for possible aneurysm
rupture. Consider catheter based angiography for further evaluation.
2. No intracranial large vessel occlusion or proximal high-grade
arterial stenosis.

ADDENDUM:
These results were called by telephone at the time of interpretation
on [DATE] at [DATE] to provider Dr. ROBERTO of the emergency
department, who verbally acknowledged these results.

*** End of Addendum ***
FINDINGS: CT HEAD FINDINGS

Brain:

Cerebral volume is normal.

There is small volume acute subarachnoid hemorrhage greatest within
the prepontine cistern, left ambient cistern, and also along the
left lateral aspect of the lower pons and medulla.

No demarcated cortical infarct.

No evidence of intracranial mass.

No hydrocephalus or midline shift.

Vascular: Reported below.

Skull: Normal. Negative for fracture or focal lesion.

Sinuses: No significant paranasal sinus disease.

Orbits: No mass or acute finding.

Review of the MIP images confirms the above findings

CTA NECK FINDINGS

Aortic arch: Standard aortic branching. The visualized aortic arch
is unremarkable. No hemodynamically significant innominate or
proximal subclavian artery stenosis.

Right carotid system: CCA and ICA patent within the neck without
stenosis. Minimal soft plaque within the carotid bifurcation.
Suspected small carotid web within the posterior aspect of the
carotid bulb (series 15, image 71).

Left carotid system: CCA and ICA patent within the neck without
stenosis. No significant atherosclerotic disease.

Vertebral arteries: Codominant and patent within the neck without
stenosis.

Skeleton: No acute bony abnormality or aggressive osseous lesion.
Cervical spondylosis greatest at C5-C6 and C6-C7.

Other neck: No neck mass or cervical lymphadenopathy.

Upper chest: No consolidation within the imaged lung apices.

Review of the MIP images confirms the above findings

CTA HEAD FINDINGS

Anterior circulation:

The intracranial internal carotid arteries are patent. The M1 middle
cerebral arteries are patent without significant stenosis. No M2
proximal branch occlusion or high-grade proximal stenosis is
identified. The anterior cerebral arteries are patent. No
intracranial aneurysm is identified.

Posterior circulation:

The intracranial vertebral arteries are patent. The basilar artery
is patent. The posterior cerebral arteries are patent. Posterior
communicating arteries are present bilaterally. No intracranial
aneurysm is identified.

Venous sinuses: Within limitations of contrast timing, no convincing
thrombus.

Anatomic variants: None significant

Review of the MIP images confirms the above findings
IMPRESSION: CT head:

Small-volume acute subarachnoid hemorrhage most notably within the
prepontine cistern, left ambient cistern and along the left lateral
aspect of the lower pons and medulla.

CTA neck:

1. The common carotid, internal carotid and vertebral arteries are
patent within the neck without stenosis.
2. Suspected small carotid web within the right carotid bulb.
3. Minimal soft plaque within the right carotid bifurcation.

CTA head:

1. No intracranial aneurysm is identified. However, the pattern of
subarachnoid hemorrhage remains suspicious for possible aneurysm
rupture. Consider catheter based angiography for further evaluation.
2. No intracranial large vessel occlusion or proximal high-grade
arterial stenosis.

## 2020-05-24 MED ORDER — ACETAMINOPHEN 650 MG RE SUPP: 650.0000 mg | RECTAL | Status: DC | PRN

## 2020-05-24 MED ORDER — ONDANSETRON HCL 4 MG/2ML IJ SOLN
4.0000 mg | Freq: Four times a day (QID) | INTRAMUSCULAR | Status: DC | PRN
  Administered 2020-05-30: 4 mg via INTRAVENOUS
  Filled 2020-05-24: qty 2

## 2020-05-24 MED ORDER — IOHEXOL 350 MG/ML SOLN
75.0000 mL | Freq: Once | INTRAVENOUS | Status: AC | PRN
  Administered 2020-05-24: 75 mL via INTRAVENOUS

## 2020-05-24 MED ORDER — NIMODIPINE 30 MG PO CAPS
60.0000 mg | ORAL_CAPSULE | ORAL | Status: DC
  Administered 2020-05-24 – 2020-05-30 (×34): 60 mg via ORAL
  Filled 2020-05-24 (×30): qty 2

## 2020-05-24 MED ORDER — SODIUM CHLORIDE 0.9 % IV SOLN: INTRAVENOUS | Status: DC

## 2020-05-24 MED ORDER — ACETAMINOPHEN 160 MG/5ML PO SOLN: 650.0000 mg | ORAL | Status: DC | PRN

## 2020-05-24 MED ORDER — NIMODIPINE 6 MG/ML PO SOLN
60.0000 mg | ORAL | Status: DC
  Filled 2020-05-24: qty 10

## 2020-05-24 MED ORDER — ONDANSETRON 4 MG PO TBDP: 4.0000 mg | ORAL_TABLET | Freq: Four times a day (QID) | ORAL | Status: DC | PRN

## 2020-05-24 MED ORDER — METOCLOPRAMIDE HCL 5 MG/ML IJ SOLN
10.0000 mg | Freq: Once | INTRAMUSCULAR | Status: DC
  Filled 2020-05-24: qty 2

## 2020-05-24 MED ORDER — MELATONIN 5 MG PO TABS
5.0000 mg | ORAL_TABLET | Freq: Every evening | ORAL | Status: DC | PRN
  Filled 2020-05-24: qty 1

## 2020-05-24 MED ORDER — ACETAMINOPHEN 325 MG PO TABS
650.0000 mg | ORAL_TABLET | ORAL | Status: DC | PRN
  Administered 2020-05-24 – 2020-05-30 (×24): 650 mg via ORAL
  Filled 2020-05-24 (×24): qty 2

## 2020-05-24 MED ORDER — STROKE: EARLY STAGES OF RECOVERY BOOK
Freq: Once | Status: AC
  Filled 2020-05-24: qty 1

## 2020-05-24 MED ORDER — DOCUSATE SODIUM 100 MG PO CAPS
100.0000 mg | ORAL_CAPSULE | Freq: Two times a day (BID) | ORAL | Status: DC
  Administered 2020-05-24 – 2020-05-29 (×7): 100 mg via ORAL
  Filled 2020-05-24 (×10): qty 1

## 2020-05-24 MED ORDER — PANTOPRAZOLE SODIUM 40 MG PO PACK: 40.0000 mg | PACK | Freq: Every day | ORAL | Status: DC

## 2020-05-24 MED ORDER — DIPHENHYDRAMINE HCL 50 MG/ML IJ SOLN
12.5000 mg | Freq: Once | INTRAMUSCULAR | Status: DC
  Filled 2020-05-24: qty 1

## 2020-05-24 MED ORDER — CHLORHEXIDINE GLUCONATE CLOTH 2 % EX PADS
6.0000 | MEDICATED_PAD | Freq: Every day | CUTANEOUS | Status: DC
  Administered 2020-05-24 – 2020-05-28 (×4): 6 via TOPICAL

## 2020-05-24 MED ORDER — PANTOPRAZOLE SODIUM 40 MG PO TBEC
40.0000 mg | DELAYED_RELEASE_TABLET | Freq: Every day | ORAL | Status: DC
  Administered 2020-05-24 – 2020-05-29 (×6): 40 mg via ORAL
  Filled 2020-05-24 (×7): qty 1

## 2020-05-24 MED ORDER — ONDANSETRON HCL 4 MG/2ML IJ SOLN
4.0000 mg | Freq: Once | INTRAMUSCULAR | Status: AC
  Administered 2020-05-24: 4 mg via INTRAVENOUS
  Filled 2020-05-24: qty 2

## 2020-05-24 MED ORDER — FENTANYL CITRATE (PF) 100 MCG/2ML IJ SOLN
50.0000 ug | Freq: Once | INTRAMUSCULAR | Status: AC
  Administered 2020-05-24: 50 ug via INTRAVENOUS
  Filled 2020-05-24: qty 2

## 2020-05-24 NOTE — Progress Notes (Signed)
Patient arrived to 4NICU from ED. Neuro intact, patient A&OX4, VSS. Patient c/o slight HA with movement. Tylenol given.   Blood pressure 139/74, pulse (!) 104, temperature 99.9 F (37.7 C), temperature source Oral, resp. rate 16, height 5' 2.5" (1.588 m), weight 61.8 kg, SpO2 100 %.

## 2020-05-24 NOTE — ED Notes (Signed)
Blood work drawn in triage and sent down to lab.

## 2020-05-24 NOTE — H&P (Signed)
Holly Murphy is an 59 y.o. female.   Chief Complaint: headache HPI: Holly Murphy is a 59 y.o. 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 Headache traveled from left side of neck into the head and across the front of her head. She did not lose consciousness. Initially suspecting a dissection a CT angio was performed. Subarachnoid blood was identified on the head CT in the prepontine perimesencephalic region. She maintained a normal exam throughout.   Past Medical History:  Diagnosis Date  . Depression 06/2009  . Hearing loss   . 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 Perimenopausal   . Postmenopausal HRT (hormone replacement therapy) 08/29/2015  . Seasonal allergies   . Situational stress    with Mom's deterioating health . Off celexa for at least 2 months   . Vaginal atrophy 07/26/2015    Past Surgical History:  Procedure Laterality Date  . COLONOSCOPY N/A 05/24/2013   Procedure: COLONOSCOPY;  Surgeon: Rogene Houston, MD;  Location: [facility];  Service: Endoscopy;  Laterality: N/A;  930    Family History  Problem Relation Age of Onset  . Diabetes Mother   . Heart attack Mother   . Hypertension Father   . Cancer Maternal Grandmother        uterine  . Heart attack Maternal Grandmother   . Heart attack Maternal Grandfather   . Other Paternal Grandmother        brain tumor  . Heart attack Paternal Grandfather   . Cancer Other        family history   . Diabetes Other        family history   . Heart defect Other        family history   . Arthritis Other        family history    Social History:  reports that she has never smoked. She has never used smokeless tobacco. She reports that she does not drink alcohol and does not use drugs.  Allergies: No Known Allergies  (Not in a hospital admission)   Results for orders placed or performed during the hospital encounter of  05/24/20 (from the past 48 hour(s))  Comprehensive metabolic panel     Status: Abnormal   Collection Time: 05/24/20 10:59 AM  Result Value Ref Range   Sodium 140 135 - 145 mmol/L   Potassium 3.5 3.5 - 5.1 mmol/L   Chloride 104 98 - 111 mmol/L   CO2 26 22 - 32 mmol/L   Glucose, Bld 147 (H) 70 - 99 mg/dL    Comment: Glucose reference range applies only to samples taken after fasting for at least 8 hours.   BUN 14 6 - 20 mg/dL   Creatinine, Ser 0.65 0.44 - 1.00 mg/dL   Calcium 10.0 8.9 - 10.3 mg/dL   Total Protein 6.9 6.5 - 8.1 g/dL   Albumin 4.3 3.5 - 5.0 g/dL   AST 18 15 - 41 U/L   ALT 15 0 - 44 U/L   Alkaline Phosphatase 53 38 - 126 U/L   Total Bilirubin 0.8 0.3 - 1.2 mg/dL   GFR, Estimated >60 >60 mL/min    Comment: (NOTE) Calculated using the CKD-EPI Creatinine Equation (2021)    Anion gap 10 5 - 15    Comment: Performed at [facility] [address], [place], [place] [address]  CBC with Differential     Status:  None   Collection Time: 05/24/20 10:59 AM  Result Value Ref Range   WBC 4.9 4.0 - 10.5 K/uL   RBC 4.45 3.87 - 5.11 MIL/uL   Hemoglobin 14.0 12.0 - 15.0 g/dL   HCT 42.5 36 - 46 %   MCV 95.5 80.0 - 100.0 fL   MCH 31.5 26.0 - 34.0 pg   MCHC 32.9 30.0 - 36.0 g/dL   RDW 12.1 11.5 - 15.5 %   Platelets 297 150 - 400 K/uL   nRBC 0.0 0.0 - 0.2 %   Neutrophils Relative % 53 %   Neutro Abs 2.6 1.7 - 7.7 K/uL   Lymphocytes Relative 36 %   Lymphs Abs 1.8 0.7 - 4.0 K/uL   Monocytes Relative 8 %   Monocytes Absolute 0.4 0.1 - 1.0 K/uL   Eosinophils Relative 1 %   Eosinophils Absolute 0.1 0.0 - 0.5 K/uL   Basophils Relative 1 %   Basophils Absolute 0.0 0.0 - 0.1 K/uL   Immature Granulocytes 1 %   Abs Immature Granulocytes 0.05 0.00 - 0.07 K/uL    Comment: Performed at [facility], [address], [place], [place] [address]   CT Angio Head W or Wo Contrast  Addendum Date: 05/24/2020   ADDENDUM REPORT: 05/24/2020 14:02 ADDENDUM: These results were called  by telephone at the time of interpretation on 05/24/2020 at 2:00 pm to provider Dr. Ron Parker of the emergency department, who verbally acknowledged these results. Electronically Signed   By: Kellie Simmering DO   On: 05/24/2020 14:02   Result Date: 05/24/2020 CLINICAL DATA:  Neck pain, saw chiropractor recently for adjustment. Additional provided: Patient reports stretching neck wall working with sudden onset of sharp pain shooting to top of head, hearing became muffled, nausea. EXAM: CT ANGIOGRAPHY HEAD AND NECK TECHNIQUE: Multidetector CT imaging of the head and neck was performed using the standard protocol during bolus administration of intravenous contrast. Multiplanar CT image reconstructions and MIPs were obtained to evaluate the vascular anatomy. Carotid stenosis measurements (when applicable) are obtained utilizing NASCET criteria, using the distal internal carotid diameter as the denominator. CONTRAST:  44mL OMNIPAQUE IOHEXOL 350 MG/ML SOLN COMPARISON:  Carotid artery duplex 12/06/2009. FINDINGS: CT HEAD FINDINGS Brain: Cerebral volume is normal. There is small volume acute subarachnoid hemorrhage greatest within the prepontine cistern, left ambient cistern, and also along the left lateral aspect of the lower pons and medulla. No demarcated cortical infarct. No evidence of intracranial mass. No hydrocephalus or midline shift. Vascular: Reported below. Skull: Normal. Negative for fracture or focal lesion. Sinuses: No significant paranasal sinus disease. Orbits: No mass or acute finding. Review of the MIP images confirms the above findings CTA NECK FINDINGS Aortic arch: Standard aortic branching. The visualized aortic arch is unremarkable. No hemodynamically significant innominate or proximal subclavian artery stenosis. Right carotid system: CCA and ICA patent within the neck without stenosis. Minimal soft plaque within the carotid bifurcation. Suspected small carotid web within the posterior aspect of the  carotid bulb (series 15, image 71). Left carotid system: CCA and ICA patent within the neck without stenosis. No significant atherosclerotic disease. Vertebral arteries: Codominant and patent within the neck without stenosis. Skeleton: No acute bony abnormality or aggressive osseous lesion. Cervical spondylosis greatest at C5-C6 and C6-C7. Other neck: No neck mass or cervical lymphadenopathy. Upper chest: No consolidation within the imaged lung apices. Review of the MIP images confirms the above findings CTA HEAD FINDINGS Anterior circulation: The intracranial internal carotid arteries are patent. The  M1 middle cerebral arteries are patent without significant stenosis. No M2 proximal branch occlusion or high-grade proximal stenosis is identified. The anterior cerebral arteries are patent. No intracranial aneurysm is identified. Posterior circulation: The intracranial vertebral arteries are patent. The basilar artery is patent. The posterior cerebral arteries are patent. Posterior communicating arteries are present bilaterally. No intracranial aneurysm is identified. Venous sinuses: Within limitations of contrast timing, no convincing thrombus. Anatomic variants: None significant Review of the MIP images confirms the above findings IMPRESSION: CT head: Small-volume acute subarachnoid hemorrhage most notably within the prepontine cistern, left ambient cistern and along the left lateral aspect of the lower pons and medulla. CTA neck: 1. The common carotid, internal carotid and vertebral arteries are patent within the neck without stenosis. 2. 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 3. Minimal soft plaque within the right carotid bifurcation. CTA head: 1. No intracranial aneurysm is identified. However, the pattern of subarachnoid hemorrhage remains suspicious for possible aneurysm rupture. Consider catheter based angiography for further evaluation. 2. No intracranial large vessel occlusion or  proximal high-grade arterial stenosis. Electronically Signed: By: Kellie Simmering DO On: 05/24/2020 13:58   CT Head Wo Contrast  Addendum Date: 05/24/2020   ADDENDUM REPORT: 05/24/2020 14:02 ADDENDUM: These results were called by telephone at the time of interpretation on 05/24/2020 at 2:00 pm to provider Dr. Ron Parker of the emergency department, who verbally acknowledged these results. Electronically Signed   By: Kellie Simmering DO   On: 05/24/2020 14:02   Result Date: 05/24/2020 CLINICAL DATA:  Neck pain, saw chiropractor recently for adjustment. Additional provided: Patient reports stretching neck wall working with sudden onset of sharp pain shooting to top of head, hearing became muffled, nausea. EXAM: CT ANGIOGRAPHY HEAD AND NECK TECHNIQUE: Multidetector CT imaging of the head and neck was performed using the standard protocol during bolus administration of intravenous contrast. Multiplanar CT image reconstructions and MIPs were obtained to evaluate the vascular anatomy. Carotid stenosis measurements (when applicable) are obtained utilizing NASCET criteria, using the distal internal carotid diameter as the denominator. CONTRAST:  72mL OMNIPAQUE IOHEXOL 350 MG/ML SOLN COMPARISON:  Carotid artery duplex 12/06/2009. FINDINGS: CT HEAD FINDINGS Brain: Cerebral volume is normal. There is small volume acute subarachnoid hemorrhage greatest within the prepontine cistern, left ambient cistern, and also along the left lateral aspect of the lower pons and medulla. No demarcated cortical infarct. No evidence of intracranial mass. No hydrocephalus or midline shift. Vascular: Reported below. Skull: Normal. Negative for fracture or focal lesion. Sinuses: No significant paranasal sinus disease. Orbits: No mass or acute finding. Review of the MIP images confirms the above findings CTA NECK FINDINGS Aortic arch: Standard aortic branching. The visualized aortic arch is unremarkable. No hemodynamically significant innominate or  proximal subclavian artery stenosis. Right carotid system: CCA and ICA patent within the neck without stenosis. Minimal soft plaque within the carotid bifurcation. Suspected small carotid web within the posterior aspect of the carotid bulb (series 15, image 71). Left carotid system: CCA and ICA patent within the neck without stenosis. No significant atherosclerotic disease. Vertebral arteries: Codominant and patent within the neck without stenosis. Skeleton: No acute bony abnormality or aggressive osseous lesion. Cervical spondylosis greatest at C5-C6 and C6-C7. Other neck: No neck mass or cervical lymphadenopathy. Upper chest: No consolidation within the imaged lung apices. Review of the MIP images confirms the above findings CTA HEAD FINDINGS Anterior circulation: The intracranial internal carotid arteries are patent. The M1 middle cerebral arteries are patent without significant  stenosis. No M2 proximal branch occlusion or high-grade proximal stenosis is identified. The anterior cerebral arteries are patent. No intracranial aneurysm is identified. Posterior circulation: The intracranial vertebral arteries are patent. The basilar artery is patent. The posterior cerebral arteries are patent. Posterior communicating arteries are present bilaterally. No intracranial aneurysm is identified. Venous sinuses: Within limitations of contrast timing, no convincing thrombus. Anatomic variants: None significant Review of the MIP images confirms the above findings IMPRESSION: CT head: Small-volume acute subarachnoid hemorrhage most notably within the prepontine cistern, left ambient cistern and along the left lateral aspect of the lower pons and medulla. CTA neck: 1. The common carotid, internal carotid and vertebral arteries are patent within the neck without stenosis. 2. 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 3. Minimal soft plaque within the right carotid bifurcation. CTA head: 1. No intracranial  aneurysm is identified. However, the pattern of subarachnoid hemorrhage remains suspicious for possible aneurysm rupture. Consider catheter based angiography for further evaluation. 2. No intracranial large vessel occlusion or proximal high-grade arterial stenosis. Electronically Signed: By: Kellie Simmering DO On: 05/24/2020 13:58   CT Angio Neck W and/or Wo Contrast  Addendum Date: 05/24/2020   ADDENDUM REPORT: 05/24/2020 14:02 ADDENDUM: These results were called by telephone at the time of interpretation on 05/24/2020 at 2:00 pm to provider Dr. Ron Parker of the emergency department, who verbally acknowledged these results. Electronically Signed   By: Kellie Simmering DO   On: 05/24/2020 14:02   Result Date: 05/24/2020 CLINICAL DATA:  Neck pain, saw chiropractor recently for adjustment. Additional provided: Patient reports stretching neck wall working with sudden onset of sharp pain shooting to top of head, hearing became muffled, nausea. EXAM: CT ANGIOGRAPHY HEAD AND NECK TECHNIQUE: Multidetector CT imaging of the head and neck was performed using the standard protocol during bolus administration of intravenous contrast. Multiplanar CT image reconstructions and MIPs were obtained to evaluate the vascular anatomy. Carotid stenosis measurements (when applicable) are obtained utilizing NASCET criteria, using the distal internal carotid diameter as the denominator. CONTRAST:  23mL OMNIPAQUE IOHEXOL 350 MG/ML SOLN COMPARISON:  Carotid artery duplex 12/06/2009. FINDINGS: CT HEAD FINDINGS Brain: Cerebral volume is normal. There is small volume acute subarachnoid hemorrhage greatest within the prepontine cistern, left ambient cistern, and also along the left lateral aspect of the lower pons and medulla. No demarcated cortical infarct. No evidence of intracranial mass. No hydrocephalus or midline shift. Vascular: Reported below. Skull: Normal. Negative for fracture or focal lesion. Sinuses: No significant paranasal sinus  disease. Orbits: No mass or acute finding. Review of the MIP images confirms the above findings CTA NECK FINDINGS Aortic arch: Standard aortic branching. The visualized aortic arch is unremarkable. No hemodynamically significant innominate or proximal subclavian artery stenosis. Right carotid system: CCA and ICA patent within the neck without stenosis. Minimal soft plaque within the carotid bifurcation. Suspected small carotid web within the posterior aspect of the carotid bulb (series 15, image 71). Left carotid system: CCA and ICA patent within the neck without stenosis. No significant atherosclerotic disease. Vertebral arteries: Codominant and patent within the neck without stenosis. Skeleton: No acute bony abnormality or aggressive osseous lesion. Cervical spondylosis greatest at C5-C6 and C6-C7. Other neck: No neck mass or cervical lymphadenopathy. Upper chest: No consolidation within the imaged lung apices. Review of the MIP images confirms the above findings CTA HEAD FINDINGS Anterior circulation: The intracranial internal carotid arteries are patent. The M1 middle cerebral arteries are patent without significant stenosis. No M2 proximal branch  occlusion or high-grade proximal stenosis is identified. The anterior cerebral arteries are patent. No intracranial aneurysm is identified. Posterior circulation: The intracranial vertebral arteries are patent. The basilar artery is patent. The posterior cerebral arteries are patent. Posterior communicating arteries are present bilaterally. No intracranial aneurysm is identified. Venous sinuses: Within limitations of contrast timing, no convincing thrombus. Anatomic variants: None significant Review of the MIP images confirms the above findings IMPRESSION: CT head: Small-volume acute subarachnoid hemorrhage most notably within the prepontine cistern, left ambient cistern and along the left lateral aspect of the lower pons and medulla. CTA neck: 1. The common carotid,  internal carotid and vertebral arteries are patent within the neck without stenosis. 2. 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 3. Minimal soft plaque within the right carotid bifurcation. CTA head: 1. No intracranial aneurysm is identified. However, the pattern of subarachnoid hemorrhage remains suspicious for possible aneurysm rupture. Consider catheter based angiography for further evaluation. 2. No intracranial large vessel occlusion or proximal high-grade arterial stenosis. Electronically Signed: By: Kellie Simmering DO On: 05/24/2020 13:58    Review of Systems  Constitutional: Negative.   HENT:       Headache this morning, along with cervical pain  Eyes: Negative.   Respiratory: Negative.   Cardiovascular: Negative.   Gastrointestinal: Negative.   Endocrine: Negative.   Genitourinary: Negative.   Musculoskeletal: Negative.   Allergic/Immunologic: Negative.   Neurological: Negative.   Hematological: Negative.   Psychiatric/Behavioral: Negative.     Blood pressure 128/70, pulse 90, temperature 97.9 F (36.6 C), resp. rate 20, SpO2 100 %. Physical Exam Constitutional:      Appearance: She is well-developed and normal weight.  HENT:     Head: Normocephalic and atraumatic.     Mouth/Throat:     Mouth: Mucous membranes are moist.     Pharynx: Oropharynx is clear.  Eyes:     Extraocular Movements: Extraocular movements intact.     Pupils: Pupils are equal, round, and reactive to light.  Cardiovascular:     Rate and Rhythm: Normal rate and regular rhythm.  Pulmonary:     Effort: Pulmonary effort is normal.     Breath sounds: Normal breath sounds.  Abdominal:     General: Bowel sounds are normal.     Palpations: Abdomen is soft.  Musculoskeletal:        General: Normal range of motion.     Cervical back: Neck supple.  Skin:    General: Skin is warm and dry.  Neurological:     Mental Status: She is alert and oriented to person, place, and time.     Cranial  Nerves: Cranial nerves are intact.     Sensory: Sensation is intact.     Motor: Motor function is intact.     Coordination: Coordination is intact.     Gait: Gait is intact.     Deep Tendon Reflexes: Reflexes are normal and symmetric. Babinski sign absent on the right side. Babinski sign absent on the left side.     Reflex Scores:      Tricep reflexes are 2+ on the right side and 2+ on the left side.      Bicep reflexes are 2+ on the right side and 2+ on the left side.      Brachioradialis reflexes are 2+ on the right side and 2+ on the left side.      Patellar reflexes are 2+ on the right side and 2+ on the left side.  Achilles reflexes are 2+ on the right side and 2+ on the left side.    Comments: Speech is clear and fluent No papilledema Normal proprioception      Assessment/Plan .Srah A Vanbergen is a 59 y.o. female With a nonanuerysmal subarachnoid hemorrhage. She is neurologically normal, and her headache has improved significantly. I will admit to the ICU for hydration. Will repeat the angio next week.   Ashok Pall, MD 05/24/2020, 5:21 PM

## 2020-05-24 NOTE — ED Triage Notes (Addendum)
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 She reports that hearing is back to baseline but she continues to have a moderate headache. Denies visual changes, dizziness or numbness. A/ox4, speech clear, face symmetrical, moves all limbs equally. States she saw a chiropractor about 1 week ago.

## 2020-05-24 NOTE — ED Provider Notes (Signed)
[facility] EMERGENCY DEPARTMENT Provider Note   CSN: [id number] Arrival date & time: 05/24/20  0941     History Chief Complaint  Patient presents with  . Headache    Holly Murphy is a 59 y.o. female with no pertinent past medical history that presents the emergency department today for severe headache.  Patient states that she had immediate onset acute headache about an hour ago while she was working, was sitting when this occurred.  Patient states that the headache started around her eyes and she had an episode of muffled hearing that lasted a couple seconds, patient states that now headache is on bilateral front of her head.  States that pain is severe, 10 out of 10.  Denies any vision changes, hearing is back to baseline.  Denies any head trauma.  Also admits to neck pain when she moves her neck.  Patient states that she recently started seeing chiropractor recently for neck pain and back pain, got recent adjustments of neck and back.  Patient states that she recently saw chiropractor last week for neck adjustments.  Denies any trouble breathing, trouble swallowing, weakness, numbness tingling.  Denies any fevers, chills.  Does admit to some nausea, no vomiting.  Has never had migraine before.  Denies any LOC.  No history of stroke.  No history of hypertension, does not take any medications daily.  Patient states that she is generally healthy.  No alcohol or drug use.  Denies any chest pain or shortness of breath.  Denies any slurred speech, dysphagia, abnormal gait.  HPI     Past Medical History:  Diagnosis Date  . Depression 06/2009  . Hearing loss   . 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 Perimenopausal   . Postmenopausal HRT (hormone replacement therapy) 08/29/2015  . Seasonal allergies   . Situational stress    with Mom's deterioating health . Off celexa for at least 2 months   . Vaginal atrophy 07/26/2015    Patient Active Problem List    Diagnosis Date Noted  . Subarachnoid hemorrhage ([place]) 05/24/2020  . Urinary frequency 03/02/2020  . Annual physical exam 08/31/2018  . Need for zoster vaccination 08/31/2018  . Hemorrhoids 05/30/2017  . 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 Vaginal atrophy 07/26/2015  . Dyslipidemia, goal LDL below 100 04/16/2014  . Osteopenia 12/27/2013  . Vitamin D deficiency 11/15/2010  . Backache 04/18/2010  . NONSPECIFIC ABNORMAL ELECTROCARDIOGRAM 12/02/2009    Past Surgical History:  Procedure Laterality Date  . COLONOSCOPY N/A 05/24/2013   Procedure: COLONOSCOPY;  Surgeon: Rogene Houston, MD;  Location: [facility];  Service: Endoscopy;  Laterality: N/A;  4     OB History    Gravida  2   Para  2   Term      Preterm      AB      Living  2     SAB      TAB      Ectopic      Multiple      Live Births              Family History  Problem Relation Age of Onset  . Diabetes Mother   . Heart attack Mother   . Hypertension Father   . Cancer Maternal Grandmother        uterine  . Heart attack Maternal Grandmother   . Heart attack Maternal Grandfather   . Other Paternal Grandmother  brain tumor  . Heart attack Paternal Grandfather   . Cancer Other        family history   . Diabetes Other        family history   . Heart defect Other        family history   . Arthritis Other        family history     Social History   Tobacco Use  . Smoking status: Never Smoker  . Smokeless tobacco: Never Used  Substance Use Topics  . Alcohol use: No  . Drug use: No    Home Medications Prior to Admission medications   Medication Sig Start Date End Date Taking? Authorizing Provider  acetaminophen (TYLENOL) 500 MG tablet Take 500 mg by mouth every 6 (six) hours as needed for mild pain.   Yes [provider]  ergocalciferol (VITAMIN D2) 1.25 MG (50000 UT) capsule Take 1 capsule (50,000 Units total) by mouth once a week. One capsule once weekly  02/29/20  Yes Fayrene Helper, MD  melatonin 5 MG TABS Take 5 mg by mouth at bedtime as needed (For sleep).   Yes [provider]  clobetasol cream (TEMOVATE) 2.68 % Apply 1 application topically 2 (two) times daily. Patient not taking: Reported on 05/24/2020 02/06/20   Fayrene Helper, MD    Allergies    Patient has no known allergies.  Review of Systems   Review of Systems  Constitutional: Negative for chills, diaphoresis, fatigue and fever.  HENT: Negative for congestion, sore throat and trouble swallowing.   Eyes: Negative for pain and visual disturbance.  Respiratory: Negative for cough, shortness of breath and wheezing.   Cardiovascular: Negative for chest pain, palpitations and leg swelling.  Gastrointestinal: Positive for nausea. Negative for abdominal distention, abdominal pain, diarrhea and vomiting.  Genitourinary: Negative for difficulty urinating.  Musculoskeletal: Positive for neck pain. Negative for back pain and neck stiffness.  Skin: Negative for pallor.  Neurological: Positive for headaches. Negative for dizziness, speech difficulty and weakness.  Psychiatric/Behavioral: Negative for confusion.    Physical Exam Updated Vital Signs BP 100/62   Pulse 62   Temp 99.9 F (37.7 C) (Oral)   Resp 16   Ht 5' 2.5" (1.588 m)   Wt 61.8 kg   SpO2 98%   BMI 24.52 kg/m   Physical Exam Constitutional:      General: She is not in acute distress.    Appearance: Normal appearance. She is not ill-appearing, toxic-appearing or diaphoretic.  HENT:     Mouth/Throat:     Mouth: Mucous membranes are moist.     Pharynx: Oropharynx is clear.  Eyes:     General: No scleral icterus.    Extraocular Movements: Extraocular movements intact.     Pupils: Pupils are equal, round, and reactive to light.  Neck:      Comments: Patient is able to range neck, however limited due to pain.  No cervical midline tenderness.  Tenderness to areas depicted above. Cardiovascular:      Rate and Rhythm: Normal rate and regular rhythm.     Pulses: Normal pulses.     Heart sounds: Normal heart sounds.  Pulmonary:     Effort: Pulmonary effort is normal. No respiratory distress.     Breath sounds: Normal breath sounds. No stridor. No wheezing, rhonchi or rales.  Chest:     Chest wall: No tenderness.  Abdominal:     General: Abdomen is flat. There is no distension.  Palpations: Abdomen is soft.     Tenderness: There is no abdominal tenderness. There is no guarding or rebound.  Musculoskeletal:        General: No swelling or tenderness. Normal range of motion.     Cervical back: Normal range of motion and neck supple. No rigidity. Pain with movement present.     Right lower leg: No edema.     Left lower leg: No edema.  Skin:    General: Skin is warm and dry.     Capillary Refill: Capillary refill takes less than 2 seconds.     Coloration: Skin is not pale.  Neurological:     General: No focal deficit present.     Mental Status: She is alert and oriented to person, place, and time.     Comments: Alert. Clear speech. No facial droop. CNIII-XII grossly intact. Bilateral upper and lower extremities' sensation grossly intact. 5/5 symmetric strength with grip strength and with plantar and dorsi flexion bilaterally.  Normal finger to nose bilaterally. Negative pronator drift. Negative Romberg sign.    Psychiatric:        Mood and Affect: Mood normal.        Behavior: Behavior normal.     ED Results / Procedures / Treatments   Labs (all labs ordered are listed, but only abnormal results are displayed) Labs Reviewed  COMPREHENSIVE METABOLIC PANEL - Abnormal; Notable for the following components:      Result Value   Glucose, Bld 147 (*)    All other components within normal limits  RESP PANEL BY RT PCR (RSV, FLU A&B, COVID)  MRSA PCR SCREENING  CBC WITH DIFFERENTIAL/PLATELET  RAPID URINE DRUG SCREEN, HOSP PERFORMED  HIV ANTIBODY (ROUTINE TESTING W REFLEX)     EKG None  Radiology CT Angio Head W or Wo Contrast  Addendum Date: 05/24/2020   ADDENDUM REPORT: 05/24/2020 14:02 ADDENDUM: These results were called by telephone at the time of interpretation on 05/24/2020 at 2:00 pm to provider Dr. Ron Parker of the emergency department, who verbally acknowledged these results. Electronically Signed   By: Kellie Simmering DO   On: 05/24/2020 14:02   Result Date: 05/24/2020 CLINICAL DATA:  Neck pain, saw chiropractor recently for adjustment. Additional provided: Patient reports stretching neck wall working with sudden onset of sharp pain shooting to top of head, hearing became muffled, nausea. EXAM: CT ANGIOGRAPHY HEAD AND NECK TECHNIQUE: Multidetector CT imaging of the head and neck was performed using the standard protocol during bolus administration of intravenous contrast. Multiplanar CT image reconstructions and MIPs were obtained to evaluate the vascular anatomy. Carotid stenosis measurements (when applicable) are obtained utilizing NASCET criteria, using the distal internal carotid diameter as the denominator. CONTRAST:  6mL OMNIPAQUE IOHEXOL 350 MG/ML SOLN COMPARISON:  Carotid artery duplex 12/06/2009. FINDINGS: CT HEAD FINDINGS Brain: Cerebral volume is normal. There is small volume acute subarachnoid hemorrhage greatest within the prepontine cistern, left ambient cistern, and also along the left lateral aspect of the lower pons and medulla. No demarcated cortical infarct. No evidence of intracranial mass. No hydrocephalus or midline shift. Vascular: Reported below. Skull: Normal. Negative for fracture or focal lesion. Sinuses: No significant paranasal sinus disease. Orbits: No mass or acute finding. Review of the MIP images confirms the above findings CTA NECK FINDINGS Aortic arch: Standard aortic branching. The visualized aortic arch is unremarkable. No hemodynamically significant innominate or proximal subclavian artery stenosis. Right carotid system: CCA and ICA  patent within the neck without stenosis. Minimal soft  plaque within the carotid bifurcation. Suspected small carotid web within the posterior aspect of the carotid bulb (series 15, image 71). Left carotid system: CCA and ICA patent within the neck without stenosis. No significant atherosclerotic disease. Vertebral arteries: Codominant and patent within the neck without stenosis. Skeleton: No acute bony abnormality or aggressive osseous lesion. Cervical spondylosis greatest at C5-C6 and C6-C7. Other neck: No neck mass or cervical lymphadenopathy. Upper chest: No consolidation within the imaged lung apices. Review of the MIP images confirms the above findings CTA HEAD FINDINGS Anterior circulation: The intracranial internal carotid arteries are patent. The M1 middle cerebral arteries are patent without significant stenosis. No M2 proximal branch occlusion or high-grade proximal stenosis is identified. The anterior cerebral arteries are patent. No intracranial aneurysm is identified. Posterior circulation: The intracranial vertebral arteries are patent. The basilar artery is patent. The posterior cerebral arteries are patent. Posterior communicating arteries are present bilaterally. No intracranial aneurysm is identified. Venous sinuses: Within limitations of contrast timing, no convincing thrombus. Anatomic variants: None significant Review of the MIP images confirms the above findings IMPRESSION: CT head: Small-volume acute subarachnoid hemorrhage most notably within the prepontine cistern, left ambient cistern and along the left lateral aspect of the lower pons and medulla. CTA neck: 1. The common carotid, internal carotid and vertebral arteries are patent within the neck without stenosis. 2. 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 3. Minimal soft plaque within the right carotid bifurcation. CTA head: 1. No intracranial aneurysm is identified. However, the pattern of subarachnoid hemorrhage  remains suspicious for possible aneurysm rupture. Consider catheter based angiography for further evaluation. 2. No intracranial large vessel occlusion or proximal high-grade arterial stenosis. Electronically Signed: By: Kellie Simmering DO On: 05/24/2020 13:58   CT Head Wo Contrast  Addendum Date: 05/24/2020   ADDENDUM REPORT: 05/24/2020 14:02 ADDENDUM: These results were called by telephone at the time of interpretation on 05/24/2020 at 2:00 pm to provider Dr. Ron Parker of the emergency department, who verbally acknowledged these results. Electronically Signed   By: Kellie Simmering DO   On: 05/24/2020 14:02   Result Date: 05/24/2020 CLINICAL DATA:  Neck pain, saw chiropractor recently for adjustment. Additional provided: Patient reports stretching neck wall working with sudden onset of sharp pain shooting to top of head, hearing became muffled, nausea. EXAM: CT ANGIOGRAPHY HEAD AND NECK TECHNIQUE: Multidetector CT imaging of the head and neck was performed using the standard protocol during bolus administration of intravenous contrast. Multiplanar CT image reconstructions and MIPs were obtained to evaluate the vascular anatomy. Carotid stenosis measurements (when applicable) are obtained utilizing NASCET criteria, using the distal internal carotid diameter as the denominator. CONTRAST:  91mL OMNIPAQUE IOHEXOL 350 MG/ML SOLN COMPARISON:  Carotid artery duplex 12/06/2009. FINDINGS: CT HEAD FINDINGS Brain: Cerebral volume is normal. There is small volume acute subarachnoid hemorrhage greatest within the prepontine cistern, left ambient cistern, and also along the left lateral aspect of the lower pons and medulla. No demarcated cortical infarct. No evidence of intracranial mass. No hydrocephalus or midline shift. Vascular: Reported below. Skull: Normal. Negative for fracture or focal lesion. Sinuses: No significant paranasal sinus disease. Orbits: No mass or acute finding. Review of the MIP images confirms the above  findings CTA NECK FINDINGS Aortic arch: Standard aortic branching. The visualized aortic arch is unremarkable. No hemodynamically significant innominate or proximal subclavian artery stenosis. Right carotid system: CCA and ICA patent within the neck without stenosis. Minimal soft plaque within the carotid bifurcation. Suspected small carotid  web within the posterior aspect of the carotid bulb (series 15, image 71). Left carotid system: CCA and ICA patent within the neck without stenosis. No significant atherosclerotic disease. Vertebral arteries: Codominant and patent within the neck without stenosis. Skeleton: No acute bony abnormality or aggressive osseous lesion. Cervical spondylosis greatest at C5-C6 and C6-C7. Other neck: No neck mass or cervical lymphadenopathy. Upper chest: No consolidation within the imaged lung apices. Review of the MIP images confirms the above findings CTA HEAD FINDINGS Anterior circulation: The intracranial internal carotid arteries are patent. The M1 middle cerebral arteries are patent without significant stenosis. No M2 proximal branch occlusion or high-grade proximal stenosis is identified. The anterior cerebral arteries are patent. No intracranial aneurysm is identified. Posterior circulation: The intracranial vertebral arteries are patent. The basilar artery is patent. The posterior cerebral arteries are patent. Posterior communicating arteries are present bilaterally. No intracranial aneurysm is identified. Venous sinuses: Within limitations of contrast timing, no convincing thrombus. Anatomic variants: None significant Review of the MIP images confirms the above findings IMPRESSION: CT head: Small-volume acute subarachnoid hemorrhage most notably within the prepontine cistern, left ambient cistern and along the left lateral aspect of the lower pons and medulla. CTA neck: 1. The common carotid, internal carotid and vertebral arteries are patent within the neck without stenosis. 2.  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 3. Minimal soft plaque within the right carotid bifurcation. CTA head: 1. No intracranial aneurysm is identified. However, the pattern of subarachnoid hemorrhage remains suspicious for possible aneurysm rupture. Consider catheter based angiography for further evaluation. 2. No intracranial large vessel occlusion or proximal high-grade arterial stenosis. Electronically Signed: By: Kellie Simmering DO On: 05/24/2020 13:58   CT Angio Neck W and/or Wo Contrast  Addendum Date: 05/24/2020   ADDENDUM REPORT: 05/24/2020 14:02 ADDENDUM: These results were called by telephone at the time of interpretation on 05/24/2020 at 2:00 pm to provider Dr. Ron Parker of the emergency department, who verbally acknowledged these results. Electronically Signed   By: Kellie Simmering DO   On: 05/24/2020 14:02   Result Date: 05/24/2020 CLINICAL DATA:  Neck pain, saw chiropractor recently for adjustment. Additional provided: Patient reports stretching neck wall working with sudden onset of sharp pain shooting to top of head, hearing became muffled, nausea. EXAM: CT ANGIOGRAPHY HEAD AND NECK TECHNIQUE: Multidetector CT imaging of the head and neck was performed using the standard protocol during bolus administration of intravenous contrast. Multiplanar CT image reconstructions and MIPs were obtained to evaluate the vascular anatomy. Carotid stenosis measurements (when applicable) are obtained utilizing NASCET criteria, using the distal internal carotid diameter as the denominator. CONTRAST:  82mL OMNIPAQUE IOHEXOL 350 MG/ML SOLN COMPARISON:  Carotid artery duplex 12/06/2009. FINDINGS: CT HEAD FINDINGS Brain: Cerebral volume is normal. There is small volume acute subarachnoid hemorrhage greatest within the prepontine cistern, left ambient cistern, and also along the left lateral aspect of the lower pons and medulla. No demarcated cortical infarct. No evidence of intracranial mass. No  hydrocephalus or midline shift. Vascular: Reported below. Skull: Normal. Negative for fracture or focal lesion. Sinuses: No significant paranasal sinus disease. Orbits: No mass or acute finding. Review of the MIP images confirms the above findings CTA NECK FINDINGS Aortic arch: Standard aortic branching. The visualized aortic arch is unremarkable. No hemodynamically significant innominate or proximal subclavian artery stenosis. Right carotid system: CCA and ICA patent within the neck without stenosis. Minimal soft plaque within the carotid bifurcation. Suspected small carotid web within the posterior aspect  of the carotid bulb (series 15, image 71). Left carotid system: CCA and ICA patent within the neck without stenosis. No significant atherosclerotic disease. Vertebral arteries: Codominant and patent within the neck without stenosis. Skeleton: No acute bony abnormality or aggressive osseous lesion. Cervical spondylosis greatest at C5-C6 and C6-C7. Other neck: No neck mass or cervical lymphadenopathy. Upper chest: No consolidation within the imaged lung apices. Review of the MIP images confirms the above findings CTA HEAD FINDINGS Anterior circulation: The intracranial internal carotid arteries are patent. The M1 middle cerebral arteries are patent without significant stenosis. No M2 proximal branch occlusion or high-grade proximal stenosis is identified. The anterior cerebral arteries are patent. No intracranial aneurysm is identified. Posterior circulation: The intracranial vertebral arteries are patent. The basilar artery is patent. The posterior cerebral arteries are patent. Posterior communicating arteries are present bilaterally. No intracranial aneurysm is identified. Venous sinuses: Within limitations of contrast timing, no convincing thrombus. Anatomic variants: None significant Review of the MIP images confirms the above findings IMPRESSION: CT head: Small-volume acute subarachnoid hemorrhage most notably  within the prepontine cistern, left ambient cistern and along the left lateral aspect of the lower pons and medulla. CTA neck: 1. The common carotid, internal carotid and vertebral arteries are patent within the neck without stenosis. 2. 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 3. Minimal soft plaque within the right carotid bifurcation. CTA head: 1. No intracranial aneurysm is identified. However, the pattern of subarachnoid hemorrhage remains suspicious for possible aneurysm rupture. Consider catheter based angiography for further evaluation. 2. No intracranial large vessel occlusion or proximal high-grade arterial stenosis. Electronically Signed: By: Kellie Simmering DO On: 05/24/2020 13:58    Procedures .Critical Care Performed by: Alfredia Client, PA-C Authorized by: Alfredia Client, PA-C   Critical care provider statement:    Critical care time (minutes):  45   Critical care was time spent personally by me on the following activities:  Discussions with consultants, evaluation of patient's response to treatment, examination of patient, ordering and performing treatments and interventions, ordering and review of laboratory studies, ordering and review of radiographic studies, pulse oximetry, re-evaluation of patient's condition, obtaining history from patient or surrogate and review of old charts   (including critical care time)  Medications Ordered in ED Medications  melatonin tablet 5 mg (has no administration in time range)  0.9 %  sodium chloride infusion ( Intravenous Rate/Dose Verify 05/25/20 0600)  acetaminophen (TYLENOL) tablet 650 mg (650 mg Oral Given 05/25/20 0732)    Or  acetaminophen (TYLENOL) 160 MG/5ML solution 650 mg ( Per Tube See Alternative 05/25/20 0732)    Or  acetaminophen (TYLENOL) suppository 650 mg ( Rectal See Alternative 05/25/20 0732)  docusate sodium (COLACE) capsule 100 mg (0 mg Oral Hold 05/25/20 1000)  ondansetron (ZOFRAN-ODT) disintegrating  tablet 4 mg (has no administration in time range)    Or  ondansetron (ZOFRAN) injection 4 mg (has no administration in time range)  niMODipine (NIMOTOP) capsule 60 mg (60 mg Oral Given 05/25/20 0614)    Or  niMODipine (NYMALIZE) 6 MG/ML oral solution 60 mg ( Per Tube See Alternative 05/25/20 0614)  pantoprazole (PROTONIX) EC tablet 40 mg (0 mg Oral Hold 05/25/20 1000)    Or  pantoprazole sodium (PROTONIX) 40 mg/20 mL oral suspension 40 mg ( Per Tube See Alternative 05/25/20 1000)  Chlorhexidine Gluconate Cloth 2 % PADS 6 each (6 each Topical Given 05/24/20 1758)  ondansetron (ZOFRAN) injection 4 mg (4 mg Intravenous Given 05/24/20 1129)  fentaNYL (SUBLIMAZE) injection 50 mcg (50 mcg Intravenous Given 05/24/20 1129)  iohexol (OMNIPAQUE) 350 MG/ML injection 75 mL (75 mLs Intravenous Contrast Given 05/24/20 1322)   stroke: mapping our early stages of recovery book ( Does not apply Given 05/24/20 1647)    ED Course  I have reviewed the triage vital signs and the nursing notes.  Pertinent labs & imaging results that were available during my care of the patient were reviewed by me and considered in my medical decision making (see chart for details).    MDM Rules/Calculators/A&P                          Tekelia A Sallis is a 59 y.o. female with no pertinent past medical history that presents the emergency department today for severe headache. Differential diagnoses considered include  CVA, atypical migraine, rule out vertebral dissection due to recent chiropractor visit.  Patient with limited range of motion to neck, however this is due to pain, no fever do not think that this is meningismus.  Normal neuro exam.  Initial interventions fentanyl and Zofran.  CT suspicious for subarachnoid hemorrhage due to aneurysm rupture, will consult neurosurgery at this time.  237 spoke to Dr. Christella Noa, neurosurgery who will come to evaluate patient.  Dr. Christella Noa will admit the patient.  The patient  appears reasonably stabilized for admission considering the current resources, flow, and capabilities available in the ED at this time, and I doubt any other [facility] requiring further screening and/or treatment in the ED prior to admission.  I discussed this case with my attending physician who cosigned this note including patient's presenting symptoms, physical exam, and planned diagnostics and interventions. Attending physician stated agreement with plan or made changes to plan which were implemented.        Final Clinical Impression(s) / ED Diagnoses Final diagnoses:  Subarachnoid hemorrhage [facility])    Rx / DC Orders ED Discharge Orders    None       Alfredia Client, PA-C 05/25/20 0740    Breck Coons, MD 05/27/20 [phone]

## 2020-05-25 LAB — HIV ANTIBODY (ROUTINE TESTING W REFLEX): HIV Screen 4th Generation wRfx: NONREACTIVE

## 2020-05-25 NOTE — Progress Notes (Signed)
Patient ID: Holly Murphy, female   DOB: 04/09/1961, 58 y.o.   MRN: [id number] BP 108/65 (BP Location: Right Arm)   Pulse 69   Temp 98.5 F (36.9 C) (Oral)   Resp 13   Ht 5' 2.5" (1.588 m)   Wt 61.8 kg   SpO2 100%   BMI 24.52 kg/m  Alert and oriented x 4, speech is clear and fluent Moving all extremities well Perrl, full eom Tongue and uvula midline

## 2020-05-25 NOTE — Evaluation (Signed)
Occupational Therapy Evaluation Patient Details Name: Holly Murphy MRN: [id number] DOB: [birth year] Today's Date: 05/25/2020    History of Present Illness 59 y.o female presenting with HA when at work. 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 No significant PMH   Clinical Impression   PTA pt living with spouse and functioning at independent community level. Pt works as Therapist, sports in hospital system and had sudden onset of symptoms during work day. At time of eval, pt able to complete all mobility and ADL and independent level. No balance deficits noted. Vision testing was [facility], as well as strength. Cognition is intact as assessed throughout evaluation. No acute changes noted in evaluation, OT will sign off at this time for no needs are identified. Thank you for this consult.    Follow Up Recommendations  No OT follow up    Equipment Recommendations  None recommended by OT    Recommendations for Other Services       Precautions / Restrictions Precautions Precautions: None Restrictions Weight Bearing Restrictions: No      Mobility Bed Mobility               General bed mobility comments: up in chair, return to chair    Transfers Overall transfer level: Independent                    Balance Overall balance assessment: Independent                                         ADL either performed or assessed with clinical judgement   ADL Overall ADL's : Independent;Needs assistance/impaired                                       General ADL Comments: Pt demonstrates ability to complete independent ADL and mobility. Pt was able to use toilet without external assist and mobilize community distances     Vision Baseline Vision/History: Wears glasses Wears Glasses: At all times Patient Visual Report: No change from baseline Vision Assessment?: No apparent visual deficits Additional Comments: vision appears fully  intact, pt reports no complaints     Perception     Praxis      Pertinent Vitals/Pain Pain Assessment: Faces Faces Pain Scale: Hurts a little bit Pain Location: generalized low back stiffness from increased immobility than what pt is typically used to Pain Descriptors / Indicators: Sore Pain Intervention(s): Repositioned     Hand Dominance     Extremity/Trunk Assessment Upper Extremity Assessment Upper Extremity Assessment: Overall WFL for tasks assessed   Lower Extremity Assessment Lower Extremity Assessment: Overall WFL for tasks assessed       Communication Communication Communication: No difficulties   Cognition Arousal/Alertness: Awake/alert Behavior During Therapy: WFL for tasks assessed/performed Overall Cognitive Status: Within Functional Limits for tasks assessed                                     General Comments       Exercises     Shoulder Instructions      Home Living Family/patient expects to be discharged to:: Private residence Living Arrangements: Spouse/significant other;Children Available Help at Discharge: Family;Available 24 hours/day Type of Home: [place]  Access: Stairs to enter [employer] of Steps: 3 Entrance Stairs-Rails: Right Home Layout: Two level Alternate Level Stairs-Number of Steps: full flight   Bathroom Shower/Tub: Occupational psychologist: Standard     Home Equipment: None          Prior Functioning/Environment Level of Independence: Independent        Comments: works as Therapist, 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        OT Problem List: Decreased activity tolerance      OT Treatment/Interventions:      OT Goals(Current goals can be found in the care plan section) Acute Rehab OT Goals Patient Stated Goal: figure out cause of bleeding OT Goal Formulation: All assessment and education complete, DC therapy  OT Frequency:     Barriers to D/C:            Co-evaluation               AM-PAC OT "6 Clicks" Daily Activity     Outcome Measure Help from another person eating meals?: None Help from another person taking care of personal grooming?: None Help from another person toileting, which includes using toliet, bedpan, or urinal?: None Help from another person bathing (including washing, rinsing, drying)?: None Help from another person to put on and taking off regular upper body clothing?: None Help from another person to put on and taking off regular lower body clothing?: None 6 Click Score: 24   End of Session Nurse Communication: Mobility status  Activity Tolerance: Patient tolerated treatment well Patient left: in chair;with call bell/phone within reach  OT Visit Diagnosis: Other abnormalities of gait and mobility (R26.89)                Time: 1540-1550 OT Time Calculation (min): 10 min Charges:  OT General Charges $OT Visit: 1 Visit OT Evaluation $OT Eval Low Complexity: 1 Low  Zenovia Jarred, MSOT, OTR/L Acute Rehabilitation Services [facility] Office Number: [phone] Pager: [phone]  Zenovia Jarred 05/25/2020, 4:27 PM

## 2020-05-25 NOTE — Evaluation (Signed)
Physical Therapy Evaluation Patient Details Name: Holly Murphy MRN: [id number] DOB: 1961-06-04 Today's Date: 05/25/2020   History of Present Illness  59 y.o female presenting with HA when at work. 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 No significant PMH  Clinical Impression  Pt OOB in recliner upon arrival of PT, agreeable to evaluation at this time. Prior to admission the pt was completely independent, working as Horticulturist, commercial for [employer]. The pt was able to demo multiple transfers, long bout of hallway ambulation, and stairs without evidence of instability, use of AD, or need for cueing or other support. The pt does report back pain and LE stiffness (back pain from PTA), and was guided through a series of stretches and exercises to reduce stiffness and maintain activity. The pt is at her baseline level of mobility, no further acute PT needs identified at this time. Thank you for the consult.      Follow Up Recommendations No PT follow up    Equipment Recommendations  None recommended by PT    Recommendations for Other Services       Precautions / Restrictions Precautions Precautions: None Restrictions Weight Bearing Restrictions: No      Mobility  Bed Mobility Overal bed mobility: Independent             General bed mobility comments: up in chair, return to chair    Transfers Overall transfer level: Independent Equipment used: None             General transfer comment: no AD needed, pt able to manage IV pole with transfers with good line awareness  Ambulation/Gait Ambulation/Gait assistance: Independent Gait Distance (Feet): 400 Feet Assistive device: None Gait Pattern/deviations: WFL(Within Functional Limits) Gait velocity: 0.5 m/s Gait velocity interpretation: 1.31 - 2.62 ft/sec, indicative of limited community ambulator General Gait Details: slightly slowed gait, but pt able to increase speed when asked. Supervision during session, but  pt able to move around without concern for LOB  Stairs Stairs: Yes Stairs assistance: Modified independent (Device/Increase time) Stair Management: One rail Right;Alternating pattern;Forwards Number of Stairs: 3 General stair comments: able to complete with alternating pattern and no evidence of instability  Wheelchair Mobility    Modified Rankin (Stroke Patients Only)       Balance Overall balance assessment: Independent                                           Pertinent Vitals/Pain Pain Assessment: Faces Faces Pain Scale: Hurts a little bit Pain Location: generalized low back stiffness from increased immobility than what pt is typically used to Pain Descriptors / Indicators: Sore Pain Intervention(s): Monitored during session;Repositioned    Home Living Family/patient expects to be discharged to:: Private residence Living Arrangements: Spouse/significant other;Children Available Help at Discharge: Family;Available 24 hours/day Type of Home: House Home Access: Stairs to enter Entrance Stairs-Rails: Right Entrance Stairs-Number of Steps: 3 Home Layout: Two level Home Equipment: None      Prior Function Level of Independence: Independent         Comments: works as Therapist, 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, newspaper        Extremity/Trunk Assessment   Upper Extremity Assessment Upper Extremity Assessment: Overall WFL for tasks assessed    Lower Extremity Assessment Lower Extremity Assessment: Overall WFL for tasks assessed    Cervical / Trunk Assessment Cervical /  Trunk Assessment: Normal  Communication   Communication: No difficulties  Cognition Arousal/Alertness: Awake/alert Behavior During Therapy: WFL for tasks assessed/performed Overall Cognitive Status: Within Functional Limits for tasks assessed                                        General Comments General comments (skin integrity, edema, etc.): VSS through  session    Exercises General Exercises - Lower Extremity Heel Raises: Strengthening;Both;10 reps;Standing Other Exercises Other Exercises: seated HS stretch with hip hinge 2 x 30 sec each leg Other Exercises: standing calf stretch against wall 2 x 30 sec each leg Other Exercises: sit-stand from chair x5-10 to pt comfort   Assessment/Plan    PT Assessment Patent does not need any further PT services  PT Problem List         PT Treatment Interventions      PT Goals (Current goals can be found in the Care Plan section)  Acute Rehab PT Goals Patient Stated Goal: figure out cause of bleeding PT Goal Formulation: With patient Time For Goal Achievement: 06/08/20 Potential to Achieve Goals: Good     AM-PAC PT "6 Clicks" Mobility  Outcome Measure Help needed turning from your back to your side while in a flat bed without using bedrails?: None Help needed moving from lying on your back to sitting on the side of a flat bed without using bedrails?: None Help needed moving to and from a bed to a chair (including a wheelchair)?: None Help needed standing up from a chair using your arms (e.g., wheelchair or bedside chair)?: None Help needed to walk in hospital room?: None Help needed climbing 3-5 steps with a railing? : A Little 6 Click Score: 23    End of Session   Activity Tolerance: Patient tolerated treatment well Patient left: in chair;with call bell/phone within reach Nurse Communication: Mobility status;Patient requests pain meds PT Visit Diagnosis: Other abnormalities of gait and mobility (R26.89)    Time: 1550-1600 PT Time Calculation (min) (ACUTE ONLY): 10 min   Charges:   PT Evaluation $PT Eval Low Complexity: 1 Low          Karma Ganja, PT, DPT   Acute Rehabilitation Department Pager #: [phone]  Otho Bellows 05/25/2020, 4:56 PM

## 2020-05-26 NOTE — Progress Notes (Signed)
Patient ID: Holly Murphy, female   DOB: Jul 13, 1961, 59 y.o.   MRN: [id number] BP 99/60   Pulse 91   Temp 98.1 F (36.7 C) (Oral)   Resp 19   Ht 5' 2.5" (1.588 m)   Wt 61.8 kg   SpO2 91%   BMI 24.52 kg/m  Alert and oriented x 4, speech is clear and fluent Moving all extremities well Perrl, full eom Symmetric facies Tongue and uvula midline Doing well,. Angiogram next week

## 2020-05-26 NOTE — Evaluation (Signed)
Speech Language Pathology Evaluation Patient Details Name: Holly Murphy MRN: [id number] DOB: 08-22-1960 Today's Date: 05/26/2020 Time: 1941-7408 SLP Time Calculation (min) (ACUTE ONLY): 17 min  Problem List:  Patient Active Problem List   Diagnosis Date Noted   Subarachnoid hemorrhage ([place]) 05/24/2020   Urinary frequency 03/02/2020   Annual physical exam 08/31/2018   Need for zoster vaccination 08/31/2018   Hemorrhoids 14/48/1856   Lichen sclerosus et atrophicus 07/26/2015   Vaginal atrophy 07/26/2015   Dyslipidemia, goal LDL below 100 04/16/2014   Osteopenia 12/27/2013   Vitamin D deficiency 11/15/2010   Backache 04/18/2010   NONSPECIFIC ABNORMAL ELECTROCARDIOGRAM 12/02/2009   Past Medical History:  Past Medical History:  Diagnosis Date   Depression 06/2009   Hearing loss    Lichen sclerosus et atrophicus 07/26/2015   Perimenopausal    Postmenopausal HRT (hormone replacement therapy) 08/29/2015   Seasonal allergies    Situational stress    with Mom's deterioating health . Off celexa for at least 2 months    Vaginal atrophy 07/26/2015   Past Surgical History:  Past Surgical History:  Procedure Laterality Date   COLONOSCOPY N/A 05/24/2013   Procedure: COLONOSCOPY;  Surgeon: Rogene Houston, MD;  Location: [facility];  Service: Endoscopy;  Laterality: N/A;  930   HPI:  59 y.o female presenting with headache when at work. 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 No significant PMH   Assessment / Plan / Recommendation Clinical Impression  Pt was seen for a cognitive-linguistic evaluation in the setting of a SAH, and she presents with overall functional cognitive-linguistic abilities.  Pt was working full time as a Therapist, sports at [employer] and she reported that she was completely independent with ADLs and IADLs prior to admission.  She completed the [place] in addition to informal evaluation measures, and she scored overall 28/30 on the  SLUMS, indicating normal cognitive abilities.  Expressive and receptive language were functional, and no dysarthria was observed.  Discussed results with the pt, and recommended that she ask her PCP for an outpatient SLP evaluation if she notices any cognitive changes if/when she returns to work.  Pt verbalized understanding.  No further skilled ST is recommended at this time.  Please re-consult if any additional concerns arise.      SLP Assessment  SLP Recommendation/Assessment: Patient does not need any further Speech Lanaguage Pathology Services SLP Visit Diagnosis: Cognitive communication deficit (R41.841)    Follow Up Recommendations  None          SLP Evaluation Cognition  Overall Cognitive Status: Within Functional Limits for tasks assessed Arousal/Alertness: Awake/alert Orientation Level: Oriented X4 Attention: Focused;Sustained Focused Attention: Appears intact Sustained Attention: Appears intact Memory: Appears intact Awareness: Appears intact Problem Solving: Appears intact Safety/Judgment: Appears intact       Comprehension  Auditory Comprehension Overall Auditory Comprehension: Appears within functional limits for tasks assessed    Expression Expression Primary Mode of Expression: Verbal Verbal Expression Overall Verbal Expression: Appears within functional limits for tasks assessed Written Expression Dominant Hand: Right Written Expression: Not tested   Oral / Motor  Motor Speech Overall Motor Speech: Appears within functional limits for tasks assessed   GO                   Colin Mulders., M.S., [place] Acute Rehabilitation Services Office: [phone]  [place] 05/26/2020, 11:29 AM

## 2020-05-27 NOTE — Progress Notes (Signed)
Patient ID: Holly Murphy, female   DOB: 03/19/1961, 59 y.o.   MRN: [id number] BP (!) 145/85   Pulse 84   Temp 98.8 F (37.1 C) (Oral)   Resp (!) 22   Ht 5' 2.5" (1.588 m)   Wt 61.8 kg   SpO2 (!) 88%   BMI 24.52 kg/m  Alert and oriented x 4. Speech is clear and fluent Moving all extremities well No drift Await angio

## 2020-05-27 NOTE — Plan of Care (Signed)
Pt able to independently complete ADLs. Positive attitude. Able to identify support system.

## 2020-05-28 ENCOUNTER — Ambulatory Visit: Payer: No Typology Code available for payment source | Admitting: Urology

## 2020-05-28 NOTE — Progress Notes (Signed)
Patient ID: Holly Murphy, female   DOB: 1961/03/07, 59 y.o.   MRN: [id number] BP 120/62   Pulse 82   Temp 99.1 F (37.3 C) (Oral)   Resp 20   Ht 5' 2.5" (1.588 m)   Wt 61.8 kg   SpO2 100%   BMI 24.52 kg/m  Angio for tomorrow Alert and oriented x 4, speech is clear and fluent Moving all extremities well No drift

## 2020-05-29 NOTE — Progress Notes (Signed)
Patient ID: Holly Murphy, female   DOB: 03/16/1961, 59 y.o.   MRN: [id number] BP 112/64   Pulse 75   Temp 98.8 F (37.1 C) (Oral)   Resp 13   Ht 5' 2.5" (1.588 m)   Wt 61.8 kg   SpO2 100%   BMI 24.52 kg/m  Alert and oriented x 4, speech is clear and fluent Moving all extremities well Angio tomorrow.

## 2020-05-30 ENCOUNTER — Inpatient Hospital Stay (HOSPITAL_COMMUNITY)
Admit: 2020-05-30 | Discharge: 2020-05-30 | Disposition: A | Discharge status: Home / Self Care | Payer: No Typology Code available for payment source | Attending: Neurosurgery | Admitting: Neurosurgery

## 2020-05-30 DIAGNOSIS — I609 Nontraumatic subarachnoid hemorrhage, unspecified: Secondary | ICD-10-CM

## 2020-05-30 HISTORY — PX: IR ANGIO VERTEBRAL SEL SUBCLAVIAN INNOMINATE BILAT MOD SED: IMG5366

## 2020-05-30 HISTORY — PX: IR ANGIO INTRA EXTRACRAN SEL COM CAROTID INNOMINATE BILAT MOD SED: IMG5360

## 2020-05-30 IMAGING — XA IR ANGIO INTRA EXTRACRAN SEL COM CAROTID INNOMINATE BILAT MOD SE
8 of 9 series · 13 of 24 positions shown · IV contrast (IODINE)
Comparison: none

PROCEDURE:
DIAGNOSTIC CEREBRAL ANGIOGRAM
HISTORY: The patient is a 59-year-old woman initially presenting to the
hospital with sudden onset of severe headache. CT scan demonstrated
diffuse primarily prepontine subarachnoid hemorrhage. CT angiogram
was of good quality and did not demonstrate any intracranial
aneurysm. She has been monitored in the and neuro intensive care
unit and has been neurologically stable. She presents today for
routine follow-up confirmatory catheter angiogram.
TECHNIQUE: CATHETERS AND WIRES
5-French JB-1 catheter

[Series 1: cerebral care 2 · 2 acquisitions, 1 frame shown (1 of 6)]
[im 1/2]
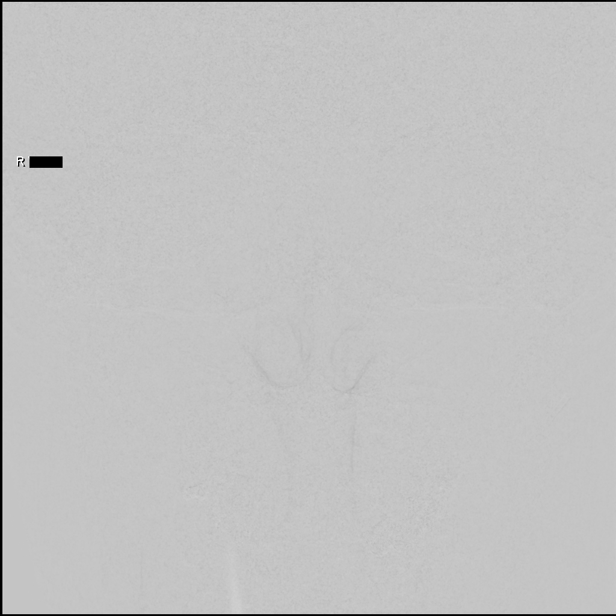

[Series 2: cerebral care 2 · 2 acquisitions, 1 frame shown (2 of 6)]
[im 1/2]
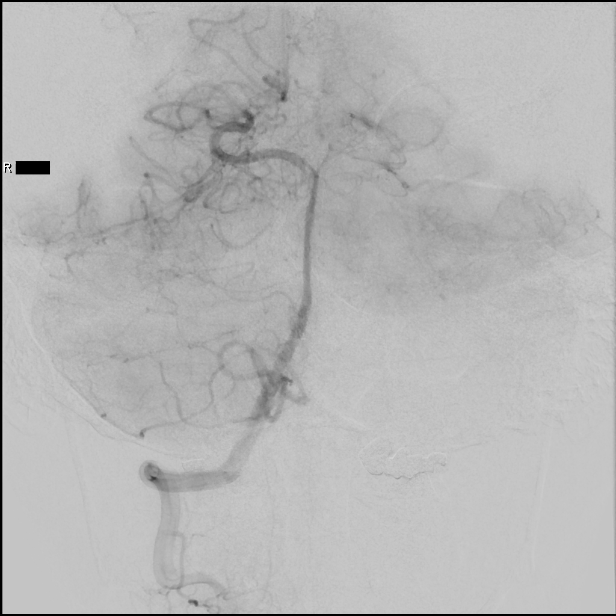

[Series 4: cerebral care 2 · 2 acquisitions, 2 frames shown (3 of 6)]
[im 1/2]
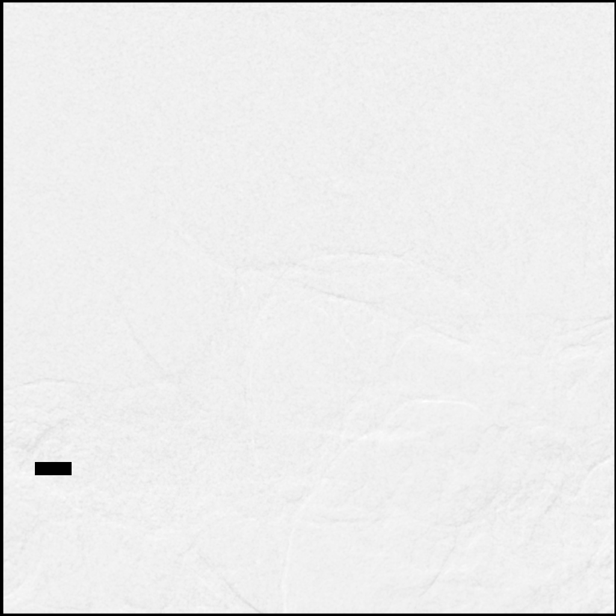
[im 2/2]
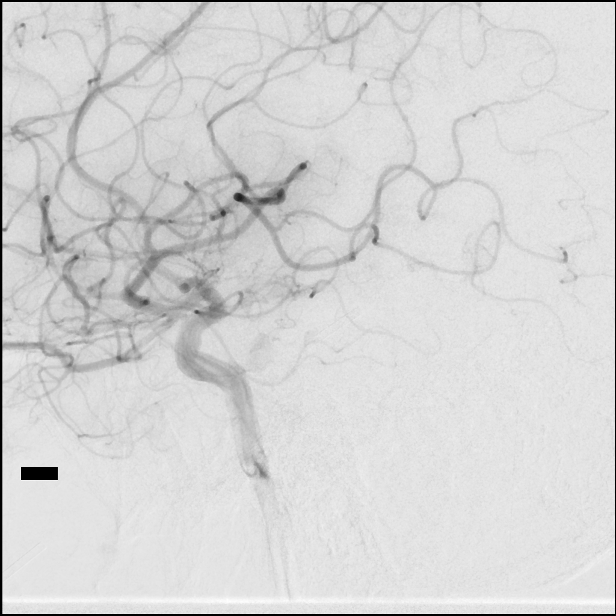

[Series 5: cerebral care 2 · 2 acquisitions, 1 frame shown (4 of 6)]
[im 1/2]
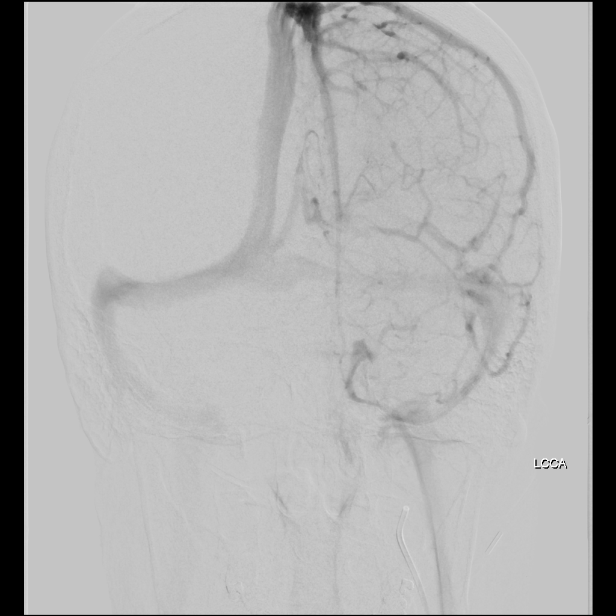

[Series 6: cerebral care 2 · 2 acquisitions, 2 frames shown (5 of 6)]
[im 1/2]
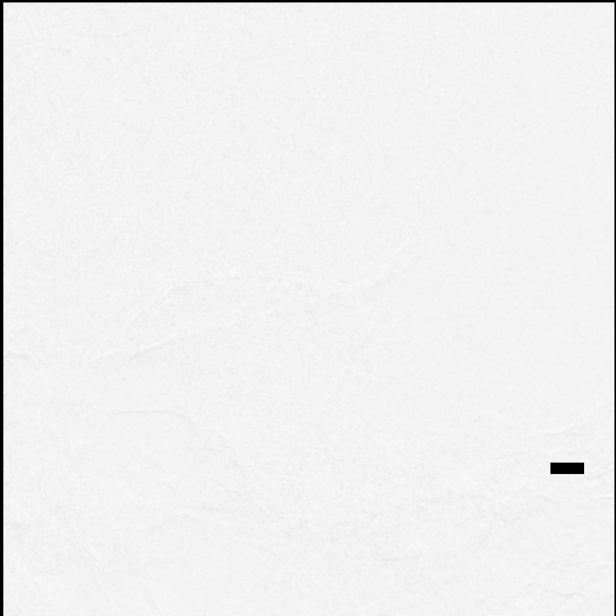
[im 2/2]
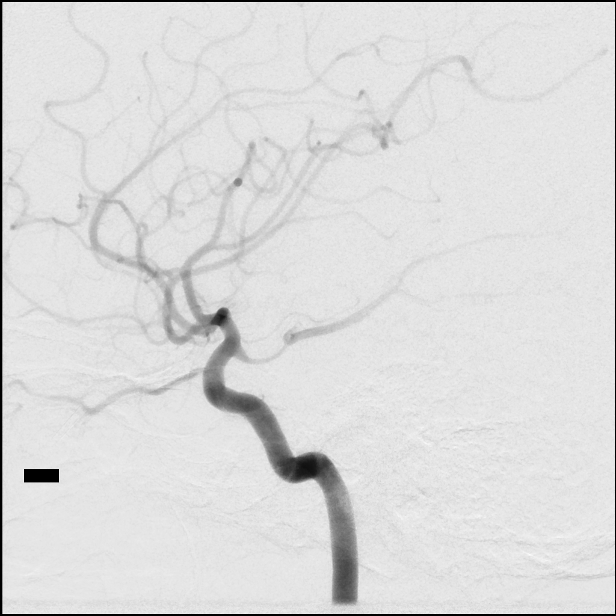

[Series 7: cerebral care 2 · 2 acquisitions, 2 frames shown (6 of 6)]
[im 1/2]
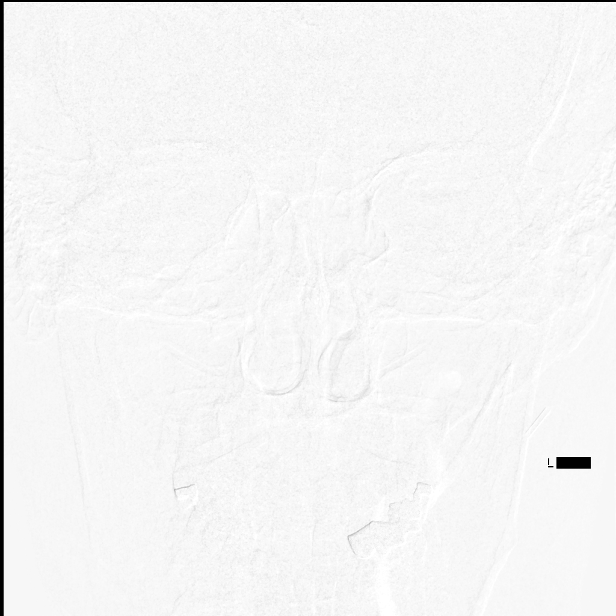
[im 2/2]
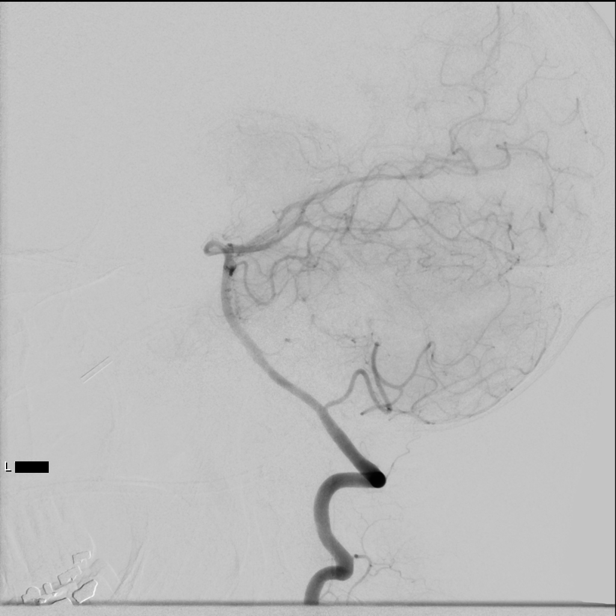

[Series 8: fl neuro n · 2 acquisitions, 1 frame shown]
[im 1/2]
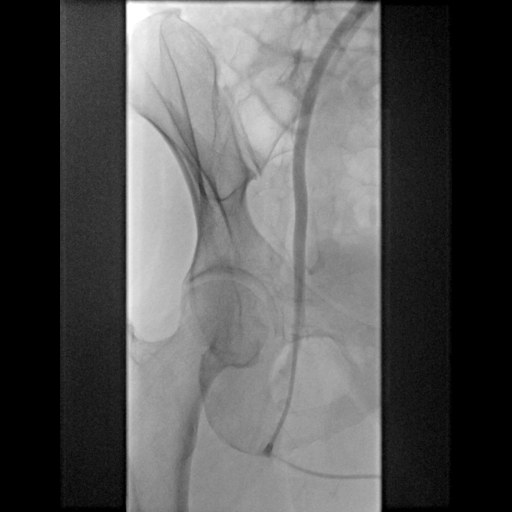

[Series 300: dr. (person_name) · 3 of 14 slices shown]
[im 1/14]
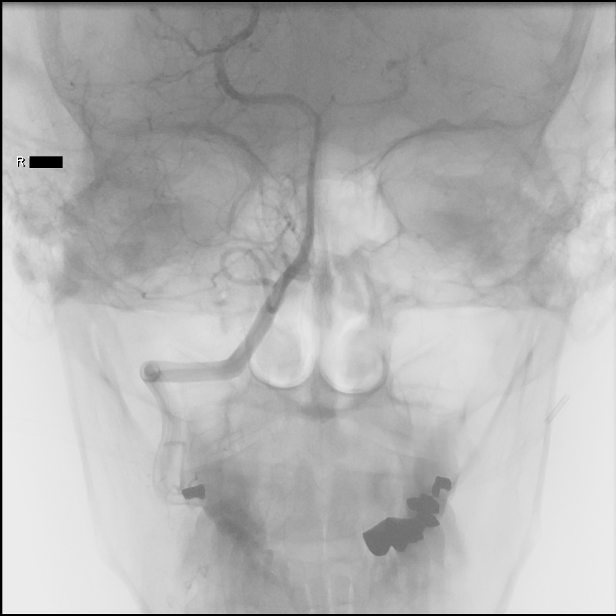
[im 7/14]
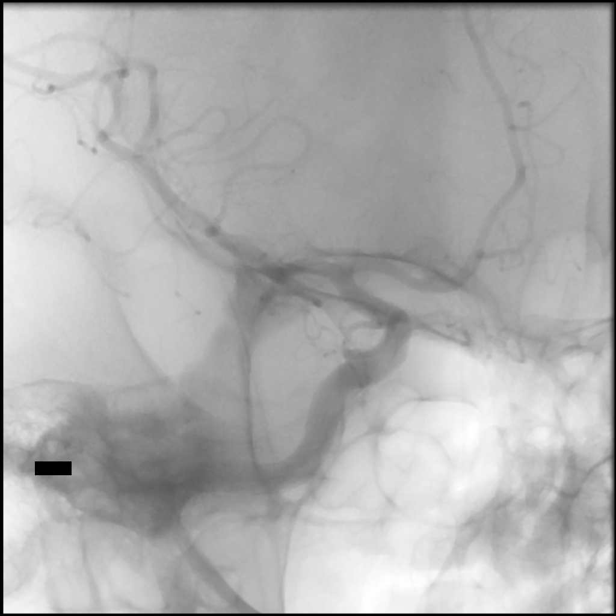
[im 14/14]
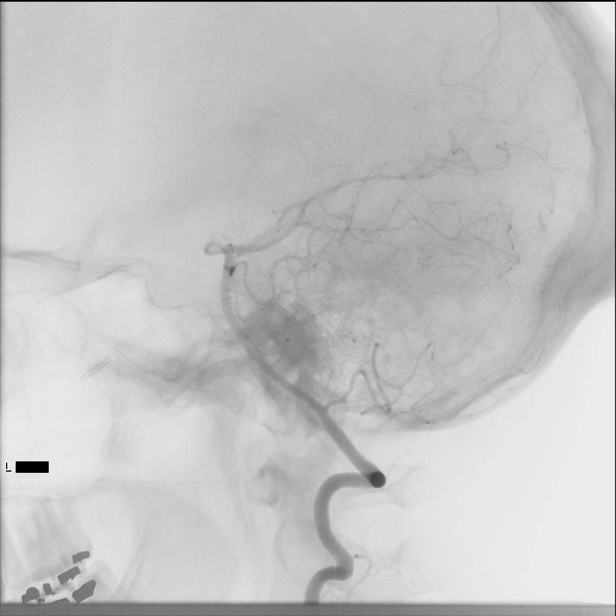

[13 of 24 positions shown; findings below may reference images not displayed]

ACCESS:
The technical aspects of the procedure as well as its potential
risks and benefits were reviewed with the patient. These risks
included but were not limited bleeding, infection, allergic
reaction, damage to organs or vital structures, stroke,
non-diagnostic procedure, and the catastrophic outcomes of heart
attack, coma, and death. With an understanding of these risks,
informed consent was obtained and witnessed. The patient was placed
in the supine position on the angiography table and the skin of
right groin prepped in the usual sterile fashion.

The procedure was performed under local anesthesia (1%-solution of
bicarbonate-buffered Lidocaine) and conscious sedation with 1mg
versed and [X3] fentanyl monitored by myself and the
in-suite nurse using continuous pulse-oximetry, heart rate, and
non-invasive blood-pressure.

A 5- French sheath was introduced in the right common femoral artery
using Seldinger technique. A fluoro-phase sequence was used to
document the sheath position.

MEDICATIONS:
HEPARIN: [X3] Units total.

CONTRAST:  cc, Omnipaque 300

FLUOROSCOPY TIME:  FLUOROSCOPY TIME: See IR records
0.035" glidewire

VESSELS CATHETERIZED
Right internal carotid

Left internal carotid

Left vertebral

Right vertebral

Right common femoral

VESSELS STUDIED
Right internal carotid, head

Left internal carotid, head

Left vertebral

Right vertebral

Right femoral

PROCEDURAL NARRATIVE
A 5-Fr JB-1 catheter was advanced over a 0.035 glidewire into the
aortic arch. The above vessels were then sequentially catheterized
and cervical / cerebral angiograms taken. After review of images,
the catheter was removed without incident.
FINDINGS: Right internal carotid, head:

Injection reveals the presence of a widely patent ICA, M1, and A1
segments and their branches. No aneurysms, AVMs, or high-flow
fistulas are seen. There is no vasospasm of the right carotid
circulation. The parenchymal and venous phases are normal. The
venous sinuses are widely patent.

Left internal carotid, head:

Injection reveals the presence of a widely patent ICA, A1, and M1
segments and their branches. No aneurysms, AVMs, or high-flow
fistulas are seen. No spasm of the left carotid circulation is seen.
The parenchymal and venous phases are normal. The venous sinuses are
widely patent.

Right vertebral:

Injection reveals the presence of a widely patent vertebral artery.
This leads to a widely patent basilar artery that terminates in
bilateral P1. The basilar apex is normal. No aneurysms, AVMs, or
high-flow fistulas are seen. There is no posterior circulation
vasospasm. The parenchymal and venous phases are normal. The venous
sinuses are widely patent.

Left vertebral:

The vertebral artery is widely patent. No PICA aneurysm is seen. See
basilar description above.

Right femoral:

Normal vessel. No significant atherosclerotic disease. Arterial
sheath in adequate position.

DISPOSITION:
Upon completion of the study, the femoral sheath was removed and
hemostasis obtained using a 5-Fr ExoSeal closure device. Good
proximal and distal lower extremity pulses were documented upon
achievement of hemostasis. The procedure was well tolerated and no
early complications were observed. The patient was transferred to
the holding area to lay flat for 2 hours.
IMPRESSION: 1. Normal cerebral angiogram without intracranial aneurysm,
arteriovenous malformation, or high-flow fistula. There is no
vasospasm.

The preliminary results of this procedure were shared with the
patient and the patient's family.

## 2020-05-30 MED ORDER — LIDOCAINE HCL 1 % IJ SOLN
INTRAMUSCULAR | Status: AC
  Filled 2020-05-30: qty 20

## 2020-05-30 MED ORDER — NITROGLYCERIN 1 MG/10 ML FOR IR/CATH LAB
INTRA_ARTERIAL | Status: AC
  Filled 2020-05-30: qty 10

## 2020-05-30 MED ORDER — VERAPAMIL HCL 2.5 MG/ML IV SOLN
INTRAVENOUS | Status: AC
  Filled 2020-05-30: qty 2

## 2020-05-30 MED ORDER — HEPARIN SODIUM (PORCINE) 1000 UNIT/ML IJ SOLN
INTRAMUSCULAR | Status: AC
  Filled 2020-05-30: qty 1

## 2020-05-30 MED ORDER — KETOROLAC TROMETHAMINE 30 MG/ML IJ SOLN
30.0000 mg | Freq: Once | INTRAMUSCULAR | Status: AC
  Administered 2020-05-30: 30 mg via INTRAVENOUS
  Filled 2020-05-30: qty 1

## 2020-05-30 MED ORDER — MIDAZOLAM HCL 2 MG/2ML IJ SOLN
INTRAMUSCULAR | Status: AC | PRN
  Administered 2020-05-30: 1 mg via INTRAVENOUS

## 2020-05-30 MED ORDER — FENTANYL CITRATE (PF) 100 MCG/2ML IJ SOLN
INTRAMUSCULAR | Status: AC | PRN
Start: 2020-05-30 — End: 2020-05-30
  Administered 2020-05-30: 25 ug via INTRAVENOUS

## 2020-05-30 MED ORDER — IOHEXOL 300 MG/ML  SOLN: 100.0000 mL | Freq: Once | INTRAMUSCULAR | Status: DC | PRN

## 2020-05-30 MED ORDER — MIDAZOLAM HCL 2 MG/2ML IJ SOLN
INTRAMUSCULAR | Status: AC
  Filled 2020-05-30: qty 2

## 2020-05-30 MED ORDER — HEPARIN SODIUM (PORCINE) 1000 UNIT/ML IJ SOLN
INTRAMUSCULAR | Status: AC | PRN
  Administered 2020-05-30: 2000 [IU] via INTRAVENOUS

## 2020-05-30 MED ORDER — FENTANYL CITRATE (PF) 100 MCG/2ML IJ SOLN
INTRAMUSCULAR | Status: AC
  Filled 2020-05-30: qty 2

## 2020-05-30 MED ORDER — DIAZEPAM 5 MG PO TABS
5.0000 mg | ORAL_TABLET | Freq: Once | ORAL | Status: AC
  Administered 2020-05-30: 5 mg via ORAL
  Filled 2020-05-30: qty 1

## 2020-05-30 MED ORDER — SODIUM CHLORIDE 0.9 % IV SOLN: INTRAVENOUS | Status: DC

## 2020-05-30 NOTE — Plan of Care (Signed)
Patient A&O x4, independent w/ambulation,and at baseline. R groin site remains C/D/I with palpable pulses. Patient able to ambulate and void in restroom. Discharge AVS reviewed with patient and daughter Eliezer Lofts). All questions answered. Confirmed that all patient belongings are accounted for prior to discharge.

## 2020-05-30 NOTE — Discharge Summary (Signed)
  Physician Discharge Summary  Patient ID: Holly Murphy MRN: [id number] DOB/AGE: 1960/09/04 59 y.o.  Admit date: 05/30/2020 Discharge date: 05/30/2020  Admission Diagnoses:  Subarachnoid Hemorrhage  Discharge Diagnoses:  Same Active Problems:   Subarachnoid hemorrhage [facility])   Discharged Condition: Stable  Hospital Course:  Holly Murphy is a 59 y.o. female admitted after [facility] with CT demonstrating SAH. Initial CTA was negative and of good quality. She was monitored in the neuro ICU with stable intact neurologic exam. Repeat catheter angiogram was done and again negative for intracranial vascular pathology. She was ambulating well, tolerating diet, with minimal pain and was discharged home in stable condition.  Treatments: Diagnostic Angiogram  Discharge Exam: Blood pressure 138/74, pulse 93, resp. rate 17, SpO2 100 %. Awake, alert, oriented Speech fluent, appropriate CN grossly intact 5/5 BUE/BLE  Disposition: Discharge disposition: 01-Home or Self Care       Discharge Instructions    Call MD for:  redness, tenderness, or signs of infection (pain, swelling, redness, odor or green/yellow discharge around incision site)   Complete by: As directed    Call MD for:  temperature >100.4   Complete by: As directed    Diet - low sodium heart healthy   Complete by: As directed    Discharge instructions   Complete by: As directed    Walk at home as much as possible, at least 4 times / day   Increase activity slowly   Complete by: As directed    Lifting restrictions   Complete by: As directed    No lifting > 10 lbs   May shower / Bathe   Complete by: As directed    48 hours after surgery   May walk up steps   Complete by: As directed    Other Restrictions   Complete by: As directed    No bending/twisting at waist     Allergies as of 05/30/2020   No Known Allergies     Medication List    Notice   This visit is during an admission. Changes to the med  list made in this visit will be reflected in the After Visit Summary of the admission.     Follow-up Information    Ashok Pall, MD Follow up in 3 week(s).   Specialty: Neurosurgery Contact information: [address] [place] [address] [phone]               Signed: Jairo Ben 05/30/2020, 9:21 AM

## 2020-05-30 NOTE — Discharge Instructions (Signed)
Call/go straight to the hospital if you have another severe headache You may have a normal diet You may drive You should not participate in activities requiring heavy exertion

## 2020-05-30 NOTE — Discharge Summary (Signed)
Physician Discharge Summary  Patient ID: Holly Murphy MRN: [id number] DOB/AGE: 57-Nov-1962 57 y.o.  Admit date: 05/24/2020 Discharge date: 05/30/2020  Admission Diagnoses:Subarachnoid hemorrhage  Discharge Diagnoses: non aneurysmal SAH Active Problems:   Subarachnoid hemorrhage [facility])   Discharged Condition: good  Hospital Course: [name] was admitted after having a very severe headache with no loss of consciousness while at work. CTA performed on admission did not show an aneurysm. Repeat 4 vessel cerebral angiogram performed today was also negative for aneurysm. She was [place] 1 on admission, and remained neurologically normal through out her hospitalization.   Treatments: angiogram  Discharge Exam: Blood pressure 127/75, pulse 73, temperature 97.8 F (36.6 C), temperature source Oral, resp. rate 20, height 5' 2.5" (1.588 m), weight 61.8 kg, SpO2 100 %. General appearance: alert, cooperative, appears stated age and no distress Neurologic: Alert and oriented X 3, normal strength and tone. Normal symmetric reflexes. Normal coordination and gait  Disposition: Discharge disposition: 01-Home or Self Care      * No surgery found *  Allergies as of 05/30/2020   No Known Allergies     Medication List    TAKE these medications   acetaminophen 500 MG tablet Commonly known as: TYLENOL Take 500 mg by mouth every 6 (six) hours as needed for mild pain.   clobetasol cream 0.05 % Commonly known as: TEMOVATE Apply 1 application topically 2 (two) times daily.   ergocalciferol 1.25 MG (50000 UT) capsule Commonly known as: VITAMIN D2 Take 1 capsule (50,000 Units total) by mouth once a week. One capsule once weekly   melatonin 5 MG Tabs Take 5 mg by mouth at bedtime as needed (For sleep).       Follow-up Information    Ashok Pall, MD Follow up in 1 month(s).   Specialty: Neurosurgery Why: call if needed for followup Contact information: [address] [place] [address] [phone]               Signed: Ashok Pall 05/30/2020, 10:31 AM

## 2020-05-30 NOTE — Consult Note (Signed)
   [facility] CM Inpatient Consult   05/30/2020  Holly Murphy Jul 09, 1961 [id number]    [place] [facility] [ACO] Patient: [facility] plan  Patient has been assigned with a [place] Management Coordinator for disease management and community resource support for post hospital follow up.    Plan: Patient will receive a post hospital call and will be evaluated for assessments and disease process education and needs for additional support from the [place].  [place] Coordinator to reach out to patient.   For additional questions or referrals please contact:   Natividad Brood, RN BSN [facility] Liaison  [phone] business mobile phone Toll free office [phone]  Fax number: [phone] [email or web address]

## 2020-05-30 NOTE — Brief Op Note (Signed)
  NEUROSURGERY BRIEF OPERATIVE  NOTE   PREOP DX: Subarachnoid Hemorrhage  POSTOP DX: Same  PROCEDURE: Diagnostic cerebral angiogram  SURGEON: Dr. Consuella Lose, MD  ANESTHESIA: IV Sedation with Local  EBL: Minimal  SPECIMENS: None  COMPLICATIONS: None  CONDITION: Stable to recovery  FINDINGS (Full report in [facility]): 1. Normal angiogram, no aneurysms, AVM, or fistulas. No vasospasm

## 2020-05-30 NOTE — Sedation Documentation (Signed)
Patient transport to [facility] with transport. At the bedside, groin site assessed with [facility]. Site is pink, warm an dry. Soft upon palpation. No visible drainage. No hematoma. Intact distal pulses.

## 2020-05-30 NOTE — Progress Notes (Signed)
  NEUROSURGERY PROGRESS NOTE   No issues overnight. Pt without complaint this am.  EXAM:  BP 131/73   Pulse 69   Temp 97.8 F (36.6 C) (Oral)   Resp 15   Ht 5' 2.5" (1.588 m)   Wt 61.8 kg   SpO2 98%   BMI 24.52 kg/m   Awake, alert, oriented  Speech fluent, appropriate  CN grossly intact  5/5 BUE/BLE   IMPRESSION:  59 y.o. female SAH d#7 initial CTA negative. Remains neurologically intact.  PLAN: - f/u confirmatory diagnostic angiogram this am  I have reviewed the indications, risks, benefits and expected post-procedure course with the patient. All questions today were answered and she provided consent to proceed.   Consuella Lose, MD [facility]

## 2020-05-31 ENCOUNTER — Encounter: Payer: Self-pay | Admitting: *Deleted

## 2020-05-31 ENCOUNTER — Other Ambulatory Visit: Payer: Self-pay | Admitting: *Deleted

## 2020-05-31 NOTE — Patient Outreach (Addendum)
[facility]) Care Management  05/31/2020  Holly Murphy 02-Mar-1961 [id number]   Transition of care call/case closure   Referral received:05/24/20 Initial outreach:05/31/20 Insurance: [place] [employer]   Subjective: Initial successful telephone call to patient's preferred number in order to complete transition of care assessment; 2 HIPAA identifiers verified. Explained purpose of call and completed transition of care assessment.  Holly Murphy states that she is is doing better since home, reports legs feeling weak since stay in the hospital. She is tolerating mobility in the home. She reports headache comes and goes, nothing like headache had on admission ,  overall better, managed with prn tylenol. Reinforced seeking emergency follow up for severe headache. 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 She reports communicating with her PCP during her hospital stay.  She discussed angiogram site at left femoral area looks good she removed bandage.  She  tolerating diet , appetite decreased, described nausea yesterday after procedure, non today, and plan on having light lunch soup today.  She denies bowel or bladder problems.  Spouse/children are assisting with her  recovery.   Reviewed accessing the following [place] Benefits : Shedenies any ongoing health issues and says she does not need a referral to one of the [place] chronic disease management programs.  She is unsure if she has  the hospital indemnity plan , provided contact number for [employer] [phone] to file a claim if needed.  She  uses a [facility] outpatient [facility] at [facility] outpatient pharmacy.      Objective:  Holly Murphy  was hospitalized at [facility] for Subarachnoid hemorrhage 11/12-11/18/21.   She  was discharged to home on 05/20/20 without the need for home health services or DME.   Assessment:  Patient voices good understanding of all discharge instructions.  See transition of care  flowsheet for assessment details.   Plan:  Reviewed hospital discharge diagnosis of Subarachnoid hemorrhage  and discharge treatment plan using hospital discharge instructions, assessing medication adherence, reviewing problems requiring provider notification, and discussing the importance of follow up with surgeon, primary care provider and/or specialists as directed.  Reviewed [place] healthy lifestyle program information to receive discounted premium for  2023   Step 1: Get  your annual physical  Step 2: Complete your health assessment  Step 3:Identify your current health status and complete the corresponding action step between January 1, and March 13, 2021.      No ongoing care management needs identified so will close case to [place] Management services and route successful outreach letter with [place] Management pamphlet and 24 Hour Nurse Line Magnet to [place] Management clinical pool to be mailed to patient's home address.    Joylene Draft, RN, BSN  [place] Management Coordinator  [phone]- Mobile [phone]- Toll Free Main Office

## 2020-06-03 ENCOUNTER — Encounter (HOSPITAL_COMMUNITY): Payer: Self-pay

## 2020-06-03 ENCOUNTER — Telehealth: Payer: Self-pay

## 2020-06-03 ENCOUNTER — Other Ambulatory Visit: Payer: Self-pay | Admitting: *Deleted

## 2020-06-03 NOTE — Telephone Encounter (Signed)
Transition Care Management Unsuccessful Follow-up Telephone Call  Date of discharge and from where:  05/30/20 [facility]  Attempts:  2nd Attempt  Reason for unsuccessful TCM follow-up call:  Left voice message

## 2020-06-03 NOTE — Telephone Encounter (Signed)
Transition Care Management Unsuccessful Follow-up Telephone Call  Date of discharge and from where:  05/30/20  Attempts:  1st Attempt  Reason for unsuccessful TCM follow-up call:  Left voice message

## 2020-06-03 NOTE — Patient Outreach (Addendum)
[facility]) Care Management  06/03/2020  Holly Murphy September 12, 1960 [id number]   Red on EMMI Alert  Day#: 1 Date: 06/01/20 Reason : Scheduled follow up appointment ?, No     Outreach attempt #1 Subjective: Successful outreach call to patient, explained reason for the call, patient familiar with care coordinator from previous transition of care call.  Addressed reason for red alert, Scheduled follow up appointment ? NO.  Patient explained that she has not scheduled follow up appointment as neurosurgeon states she can follow up in a month if she feels she needs too, she states that she will probably follow up due to the seriousness of condition.  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 Reinforced scheduling follow up visit with neurosurgeon.   [name] discussed over the weekend having back and leg pain she placed call to on call neurosurgeon and was explained that it was common, she reports being prescribed steroid dose pack, gabapentin, robaxin  and received relief of leg/back pain and improvement in intermittent headache.   Patient agreeable to follow up call in the next week.   Plan Will plan return call in the next week.  Will send message to [place] Management assist patient request to discontinue emmi calls.  Patient has been sent out [facility] successful outreach letter in the last business day, declined need for another letter at this time.    Joylene Draft, RN, BSN  [place] Management Coordinator  [phone]- Mobile [phone]- Toll Free Main Office

## 2020-06-12 ENCOUNTER — Telehealth: Payer: Self-pay

## 2020-06-12 ENCOUNTER — Other Ambulatory Visit: Payer: Self-pay | Admitting: *Deleted

## 2020-06-12 NOTE — Telephone Encounter (Signed)
Pt called to advise that she is notify us that she needs a referral - we dont have to do anything

## 2020-06-12 NOTE — Patient Outreach (Signed)
[facility]) Care Management  06/12/2020  DOREEN GARRETSON 25-Nov-1960 [id number]   KONNER WARRIOR 09-25-1960 [id number]   Red on EMMI Alert  Day#: 1 Date: 06/01/20 Reason : Scheduled follow up appointment ?, No     Subjective Successful follow up call to Dimitria, she reports that she returned to work on this week. She discussed recent discomfort with back and leg pain, complaints of nausea yesterday and attributes it to taking neurontin recently started by neurology for complaint but stopped taking it on yesterday. She reports call to Neurology office to ask if she can take nsaids for discomfort currently taking tylenol for discomfort.  Toshiko states that she has not scheduled post discharge visit with neurology but will schedule follow up visit when called returned today. Reinforced importance of follow visit , she agrees.   Plan  No ongoing care management needs identified. Will close case to [facility] care management .   Joylene Draft, RN, BSN  [place] Management Coordinator  [phone]- Mobile [phone]- Toll Free Main Office

## 2020-06-14 ENCOUNTER — Other Ambulatory Visit: Payer: Self-pay | Admitting: *Deleted

## 2020-06-14 NOTE — Patient Outreach (Signed)
[facility]) Care Management  06/14/2020  Holly Murphy November 04, 1960 [id number]   Red on EMMI Alert  Day#: 13 Date: 12/2 /21 Reason : Martin Majestic to follow up appointment ? No    Subjective Successful outreach call to [facility] , explained reason for the call follow up on EMMI red alert flag from automated calls post discharge.  Addressed EMMI red Alert Went to follow up appointment ? No  [name] reports having Appointment with Neurosurgeon 12/13 and primary provider on 06/20/20. She denies any new concerns at this time and has  returned to work .     Objective: [name] was hospitalized [facility] for Subarachnoid hemorrhage 11/12-11/18/21.   She  was discharged to home on 05/20/20 without the need for home health servicesor DME.  Plan  Will close case to [facility] care management no new care management needs identified.    Joylene Draft, RN, BSN  [place] Management Coordinator  [phone]- Mobile [phone]- Toll Free Main Office

## 2020-06-19 MED FILL — VIT D2 1.25 MG (50,000 UNIT: 1.25 MG | 84 days supply | Qty: 12 | Fill #1

## 2020-06-20 ENCOUNTER — Ambulatory Visit (INDEPENDENT_AMBULATORY_CARE_PROVIDER_SITE_OTHER): Payer: No Typology Code available for payment source | Admitting: Family Medicine

## 2020-06-20 ENCOUNTER — Other Ambulatory Visit: Payer: Self-pay

## 2020-06-20 ENCOUNTER — Encounter: Payer: Self-pay | Admitting: Family Medicine

## 2020-06-20 VITALS — BP 120/74 | HR 78 | Resp 14 | Ht 63.0 in | Wt 134.0 lb

## 2020-06-20 DIAGNOSIS — M546 Pain in thoracic spine: Secondary | ICD-10-CM

## 2020-06-20 DIAGNOSIS — I609 Nontraumatic subarachnoid hemorrhage, unspecified: Secondary | ICD-10-CM | POA: Diagnosis not present

## 2020-06-20 DIAGNOSIS — M542 Cervicalgia: Secondary | ICD-10-CM

## 2020-06-20 DIAGNOSIS — Z09 Encounter for follow-up examination after completed treatment for conditions other than malignant neoplasm: Secondary | ICD-10-CM | POA: Diagnosis not present

## 2020-06-20 DIAGNOSIS — G8929 Other chronic pain: Secondary | ICD-10-CM

## 2020-06-20 DIAGNOSIS — M549 Dorsalgia, unspecified: Secondary | ICD-10-CM | POA: Diagnosis not present

## 2020-06-20 DIAGNOSIS — M47812 Spondylosis without myelopathy or radiculopathy, cervical region: Secondary | ICD-10-CM

## 2020-06-20 NOTE — Progress Notes (Signed)
Hospital

## 2020-06-20 NOTE — Patient Instructions (Addendum)
F/U as before., call if you need me sooner   I will send you messages if new information comes up   You are being referred to Neurology for follow up, we may need to specifically take additional images of your spine  Thanks for choosing [facility], we consider it a privelige to serve you.

## 2020-06-20 NOTE — Progress Notes (Signed)
   Holly Murphy     MRN: [id number]      DOB: [birth year]   HPI Holly Murphy is here for follow up recent hospitalization from 11/12 to 05/30/2020 with a diagnosis of subarachnoid hemorrhage She remained neurologically stable throughout and imaging studies done on admission and prior to discharge demonstrated no intracranial vascular abnormality. Discharged on tylenol for pain Reports that during hospitalization she c/o back pain which became so sever and radiated down legs that she called in for pain, thinks prednisone helped the most, robaxin andgabapentin were also prescribed. Prior to event she has been going to chiropractor for chronic back and neck pain and had recently had manipulation of th neck, does not intend to return    Headache  Rated 1 to 2 , general location Has dizziness/ light headed , with nausea, raising arms above head  reaching for something , also washing hair, bending over  Most remakable was the post hospital LBP radiating to  calves posterior , up to a 10, responded to steroids, robaxin  and  Gabapentin, stopped both since 12/1, and has been back to work 11/29 ROS Denies recent fever or chills. Denies sinus pressure, nasal congestion, ear pain or sore throat. Denies chest congestion, productive cough or wheezing. Denies chest pains, palpitations and leg swelling Denies abdominal pain, nausea, vomiting,diarrhea or constipation.   Denies dysuria, frequency, hesitancy or incontinence. . Denies depression, anxiety or insomnia. Denies skin break down or rash.   PE  BP 120/74   Pulse 78   Resp 14   Ht 5\' 3"  (1.6 m)   Wt 134 lb (60.8 kg)   SpO2 98%   BMI 23.74 kg/m   Patient alert and oriented and in no cardiopulmonary distress.  HEENT: No facial asymmetry, EOMI,     Neck supple .  Chest: Clear to auscultation bilaterally.  CVS: S1, S2 no murmurs, no S3.Regular rate.  .   Ext: No edema  MS: Adequate ROM spine, shoulders, hips and  knees.  Skin: Intact, no ulcerations or rash noted.  Psych: Good eye contact, normal affect. Memory intact not anxious or depressed appearing.  CNS: CN 2-12 intact, power,  normal throughout.no focal deficits noted.   [facility] discharge follow-up Patient in for follow up of recent hospitalization. Discharge summary, and laboratory and radiology data are reviewed, and any questions or concerns  are discussed. Specific issues requiring follow up are specifically addressed.   Subarachnoid hemorrhage (HCC) Neurologically stable, no intracranial vascular abnormality identified to date. Severe back and lower extremity pain and new dizziness in certain positions. Needs MRI C spine and lumbar spiune and refer to Neurology also  Back pain with radiation Severe back and lower extremity pain in setting of chronic back pain being treated by chiropractor , also recent unexplained SAH, needs MRI lumbar spine  Neck pain of over 3 months duration Recently reciving treatment for chronic neck pain by chiropractor, and new SAH unclear etiology, needs mRI C spine

## 2020-06-24 ENCOUNTER — Encounter: Payer: Self-pay | Admitting: Family Medicine

## 2020-06-24 DIAGNOSIS — M542 Cervicalgia: Secondary | ICD-10-CM | POA: Insufficient documentation

## 2020-06-24 DIAGNOSIS — M549 Dorsalgia, unspecified: Secondary | ICD-10-CM | POA: Insufficient documentation

## 2020-06-24 DIAGNOSIS — R030 Elevated blood-pressure reading, without diagnosis of hypertension: Secondary | ICD-10-CM | POA: Insufficient documentation

## 2020-06-24 DIAGNOSIS — Z09 Encounter for follow-up examination after completed treatment for conditions other than malignant neoplasm: Secondary | ICD-10-CM | POA: Insufficient documentation

## 2020-06-24 NOTE — Addendum Note (Signed)
Addended by: Fayrene Helper on: 06/24/2020 06:40 AM   Modules accepted: Orders

## 2020-06-24 NOTE — Assessment & Plan Note (Signed)
Patient in for follow up of recent hospitalization. Discharge summary, and laboratory and radiology data are reviewed, and any questions or concerns  are discussed. Specific issues requiring follow up are specifically addressed.  

## 2020-06-24 NOTE — Assessment & Plan Note (Signed)
Recently reciving treatment for chronic neck pain by chiropractor, and new SAH unclear etiology, needs mRI C spine

## 2020-06-24 NOTE — Assessment & Plan Note (Signed)
Severe back and lower extremity pain in setting of chronic back pain being treated by chiropractor , also recent unexplained SAH, needs MRI lumbar spine

## 2020-06-24 NOTE — Assessment & Plan Note (Signed)
Neurologically stable, no intracranial vascular abnormality identified to date. Severe back and lower extremity pain and new dizziness in certain positions. Needs MRI C spine and lumbar spiune and refer to Neurology also

## 2020-06-25 ENCOUNTER — Ambulatory Visit (HOSPITAL_COMMUNITY)
Admission: RE | Admit: 2020-06-25 | Discharge: 2020-06-25 | Disposition: A | Discharge status: Home / Self Care | Payer: No Typology Code available for payment source | Source: Ambulatory Visit | Attending: Family Medicine | Admitting: Family Medicine

## 2020-06-25 ENCOUNTER — Other Ambulatory Visit: Payer: Self-pay

## 2020-06-25 DIAGNOSIS — M546 Pain in thoracic spine: Secondary | ICD-10-CM | POA: Diagnosis present

## 2020-06-25 DIAGNOSIS — G8929 Other chronic pain: Secondary | ICD-10-CM | POA: Diagnosis present

## 2020-06-25 IMAGING — DX DG THORACIC SPINE 3V
3 series · 3 of 3 positions shown · non-contrast
Comparison: None.

CLINICAL DATA: Dorsalgia

EXAM:
THORACIC SPINE - 3 VIEWS

[t-spine ap]
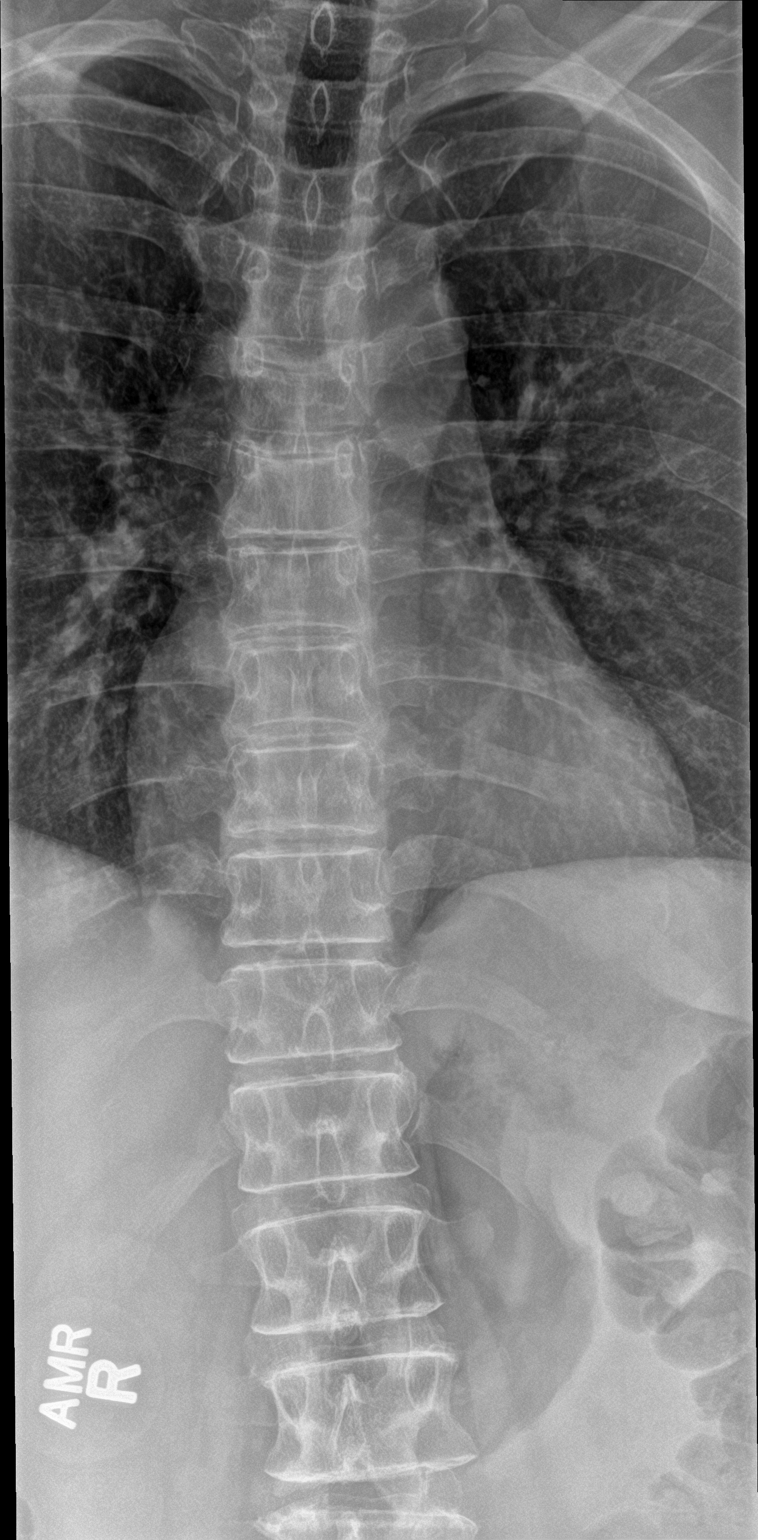

[t-spine lat]
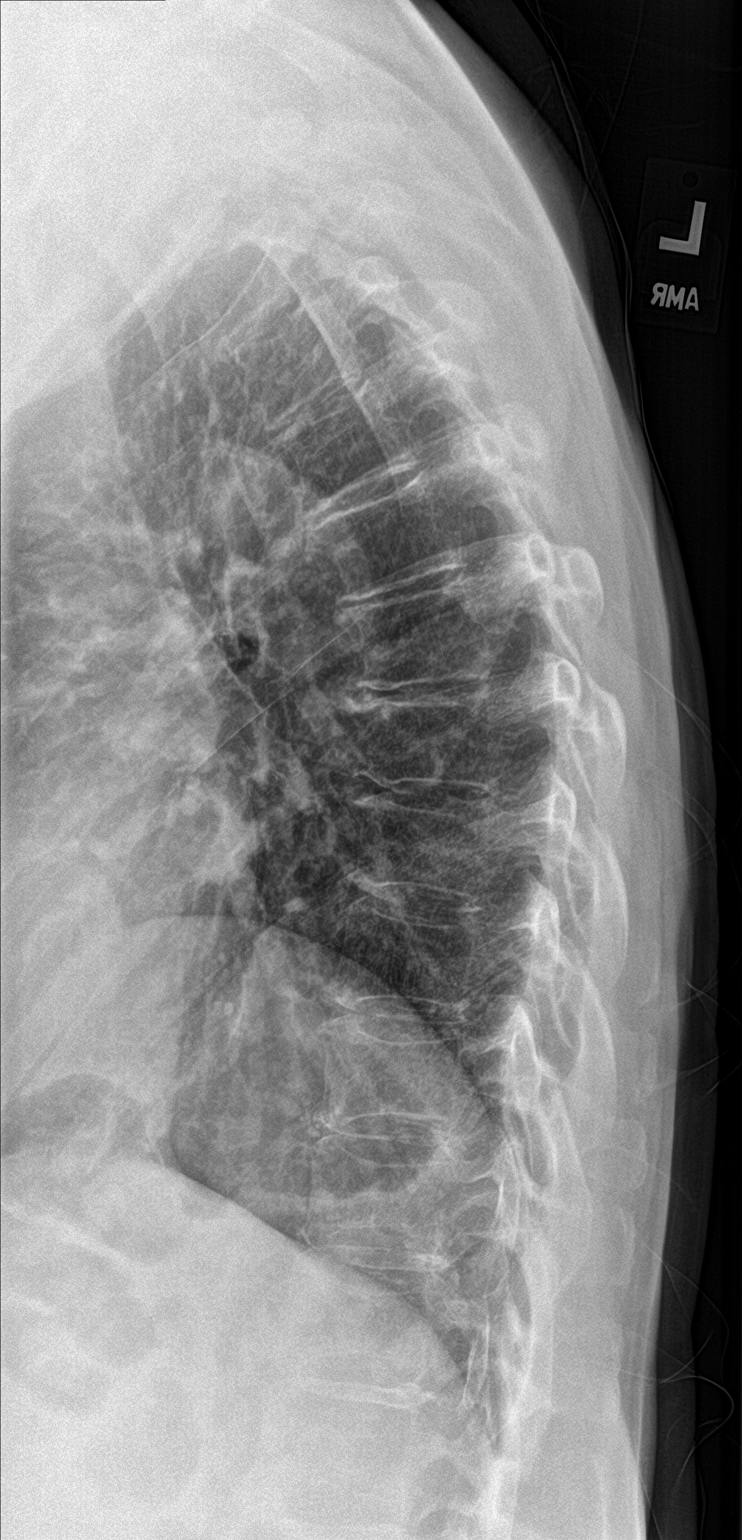

[t-spine swimmers]
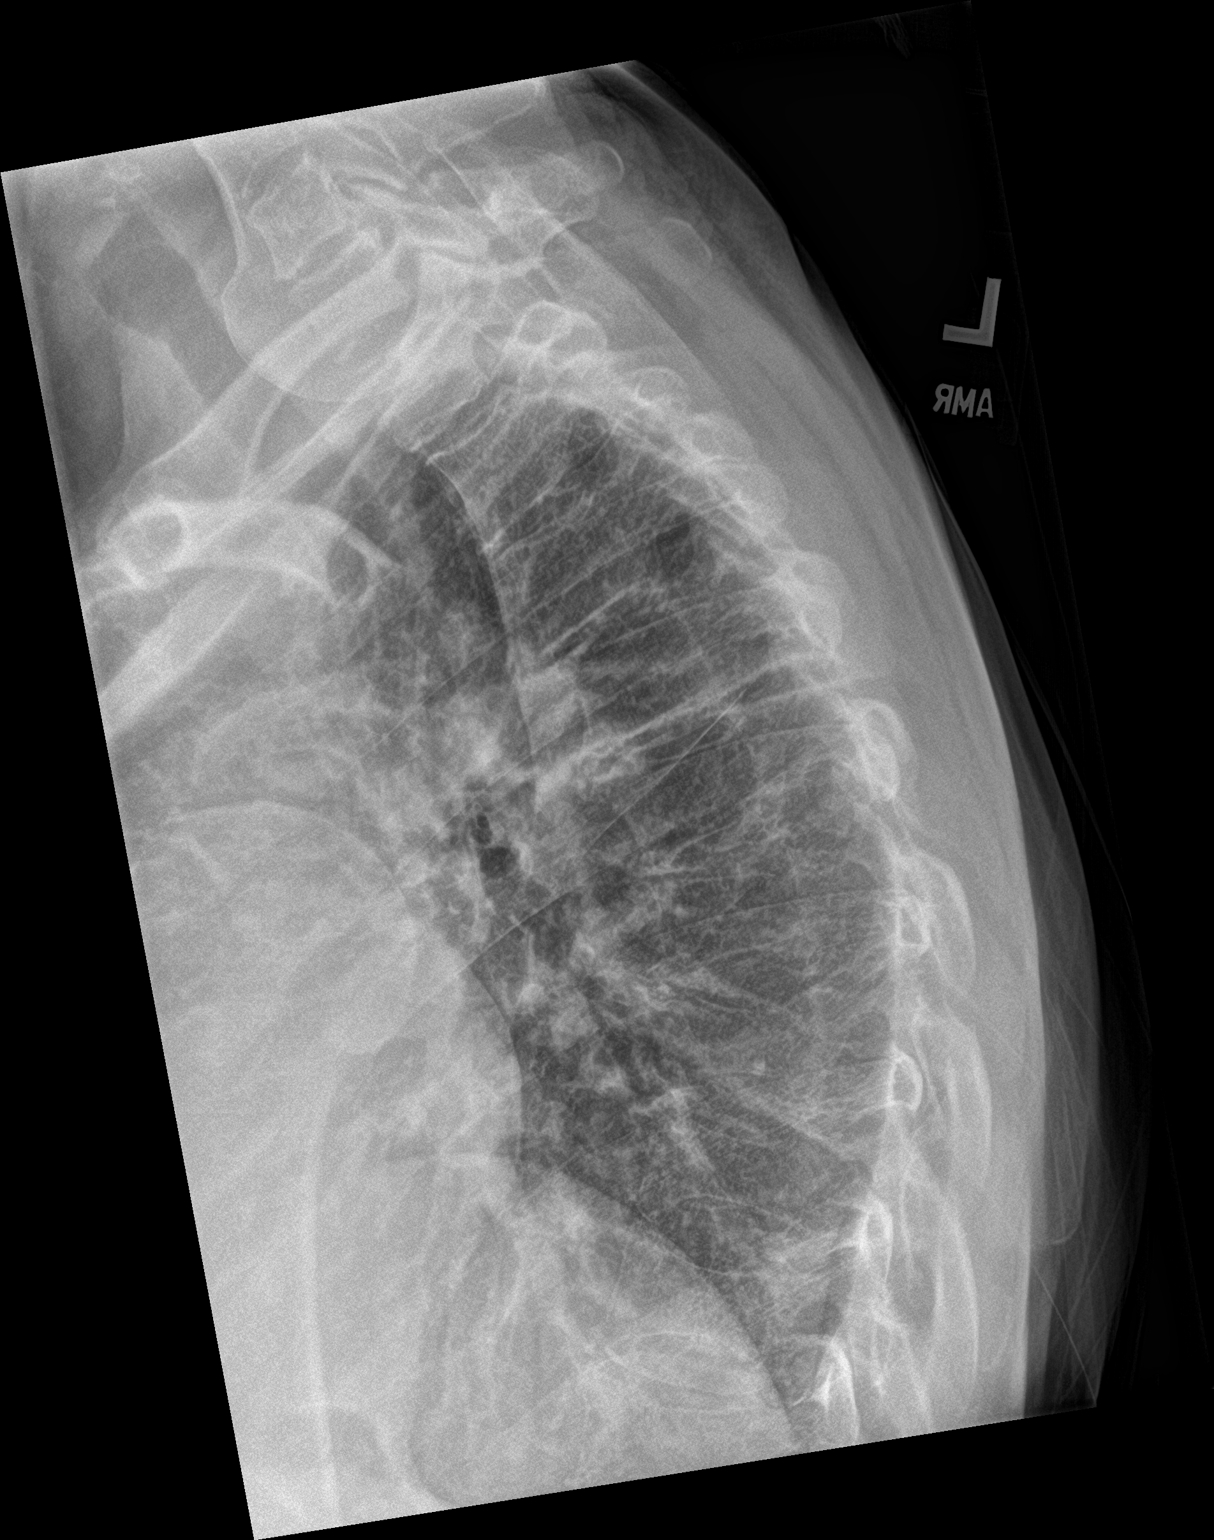

[3 of 3 positions shown; findings below may reference images not displayed]

FINDINGS: Frontal, lateral, and swimmer's views were obtained. There is mild
midthoracic dextroscoliosis. No fracture or spondylolisthesis. There
is mild disc space narrowing at several levels in the mid to lower
thoracic region. No erosive change or paraspinous lesion. Visualized
lungs clear.
IMPRESSION: Mild disc space narrowing at several levels consistent with a degree
of osteoarthritic change. Slight scoliosis. No fracture or
spondylolisthesis.

## 2020-06-27 ENCOUNTER — Other Ambulatory Visit: Payer: Self-pay | Admitting: Family Medicine

## 2020-06-27 DIAGNOSIS — M549 Dorsalgia, unspecified: Secondary | ICD-10-CM

## 2020-06-27 NOTE — Progress Notes (Signed)
Mri thoraci

## 2020-07-09 ENCOUNTER — Other Ambulatory Visit (HOSPITAL_COMMUNITY): Payer: No Typology Code available for payment source

## 2020-07-09 ENCOUNTER — Ambulatory Visit (HOSPITAL_COMMUNITY): Payer: No Typology Code available for payment source

## 2020-07-11 ENCOUNTER — Ambulatory Visit (HOSPITAL_COMMUNITY)
Admission: RE | Admit: 2020-07-11 | Discharge: 2020-07-11 | Disposition: A | Discharge status: Home / Self Care | Payer: No Typology Code available for payment source | Source: Ambulatory Visit | Attending: Family Medicine | Admitting: Family Medicine

## 2020-07-11 ENCOUNTER — Other Ambulatory Visit: Payer: Self-pay

## 2020-07-11 DIAGNOSIS — M549 Dorsalgia, unspecified: Secondary | ICD-10-CM | POA: Insufficient documentation

## 2020-07-11 DIAGNOSIS — M542 Cervicalgia: Secondary | ICD-10-CM | POA: Diagnosis present

## 2020-07-11 DIAGNOSIS — I609 Nontraumatic subarachnoid hemorrhage, unspecified: Secondary | ICD-10-CM | POA: Insufficient documentation

## 2020-07-11 DIAGNOSIS — M47812 Spondylosis without myelopathy or radiculopathy, cervical region: Secondary | ICD-10-CM | POA: Diagnosis present

## 2020-07-11 IMAGING — MR MR THORACIC SPINE W/O CM
6 series · 31 of 48 positions shown · non-contrast
Comparison: None.

CLINICAL DATA: Neck and back pain, recent subarachnoid hemorrhage

EXAM:
MRI CERVICAL, THORACIC AND LUMBAR SPINE WITHOUT CONTRAST
TECHNIQUE: Multiplanar and multiecho pulse sequences of the cervical spine, to
include the craniocervical junction and cervicothoracic junction,
and thoracic and lumbar spine, were obtained without intravenous
contrast.

[Series 16: T1 · sagittal · 5.0mm · 1.88mm/px · 2 of 9 slices shown (1 of 2)]
[im 1/9]
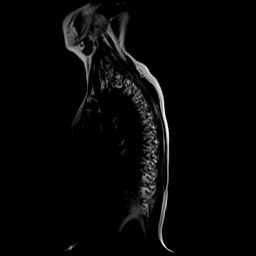
[im 9/9]
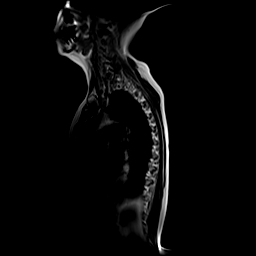

[Series 17: T2 · sagittal · 3.0mm · 1.12mm/px · 6 of 17 slices shown (1 of 2)]
[im 1/17]
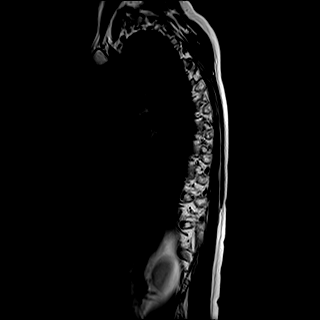
[im 4/17]
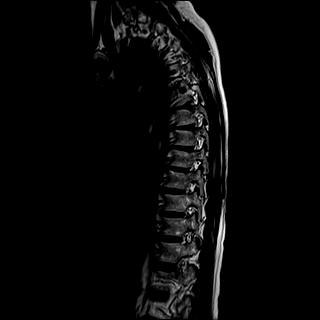
[im 7/17]
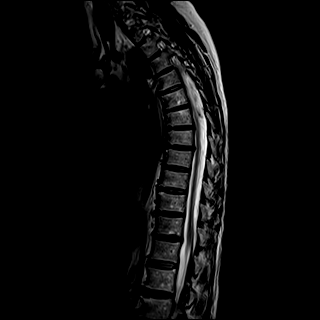
[im 10/17]
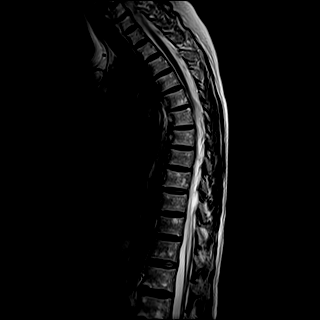
[im 13/17]
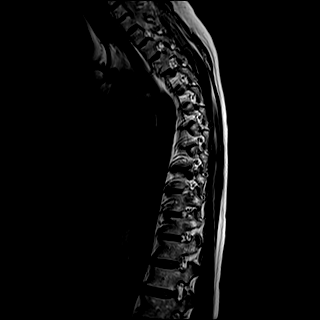
[im 17/17]
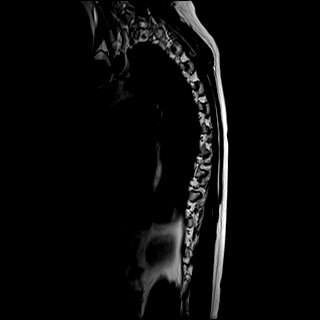

[Series 18: T1 · sagittal · 3.0mm · 1.12mm/px · 6 of 17 slices shown (2 of 2)]
[im 1/17]
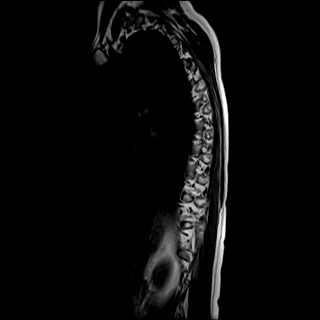
[im 4/17]
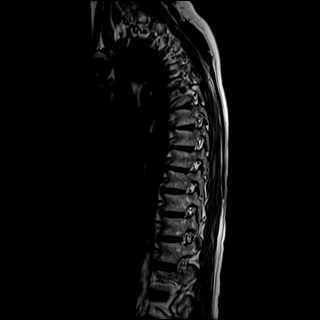
[im 7/17]
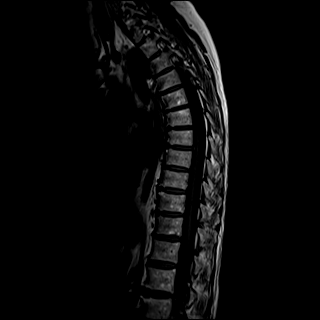
[im 10/17]
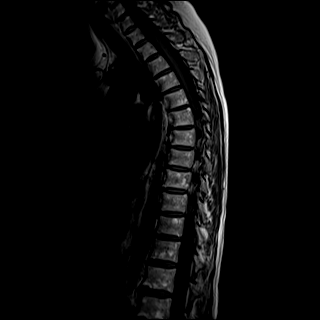
[im 13/17]
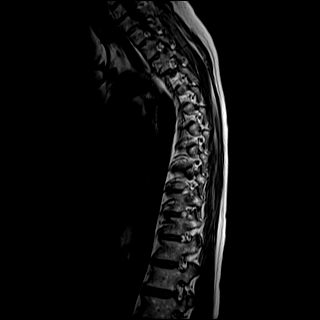
[im 17/17]
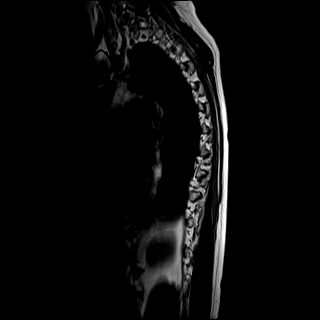

[Series 19: STIR · sagittal · 3.0mm · 0.70mm/px · 6 of 17 slices shown]
[im 1/17]
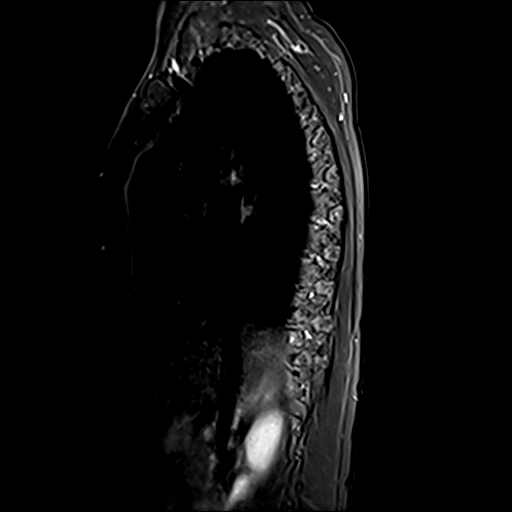
[im 4/17]
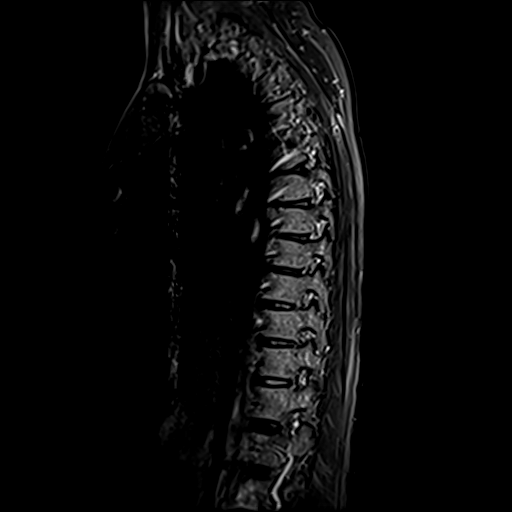
[im 7/17]
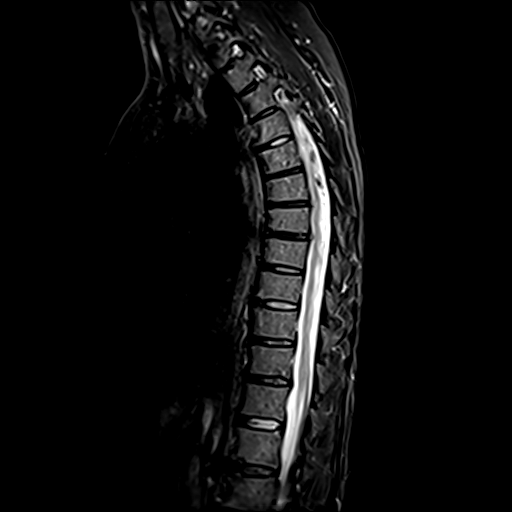
[im 10/17]
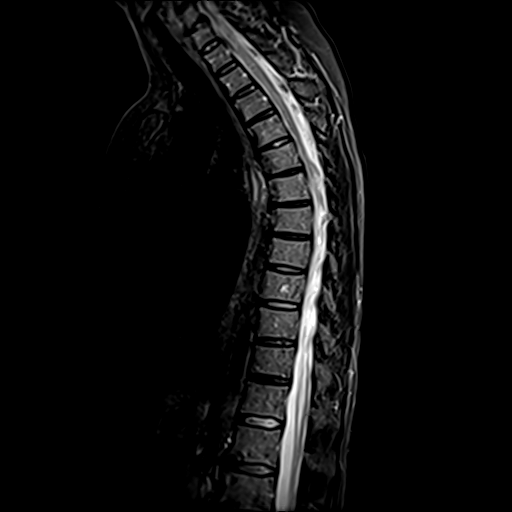
[im 13/17]
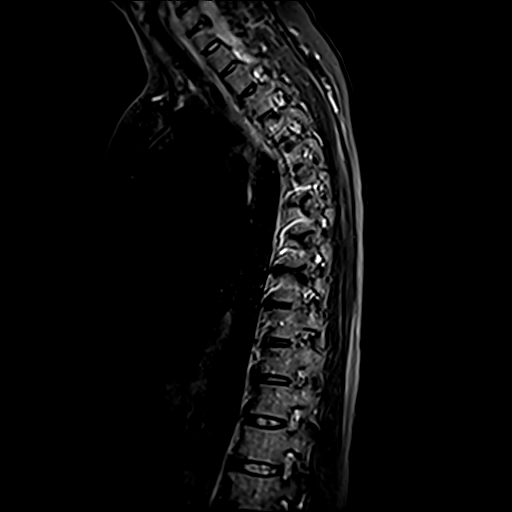
[im 17/17]
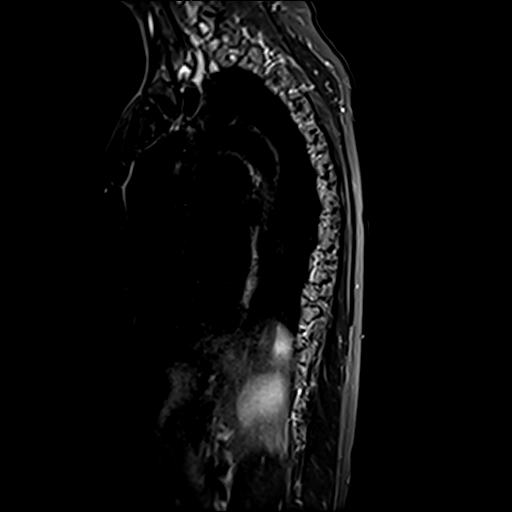

[Series 1004: T2 · axial · 4.0mm · 0.48mm/px · z∈[-393,-138]mm · 8 of 42 slices shown (2 of 2)]
[im 1/42]
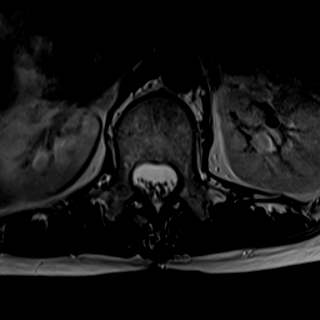
[im 7/42]
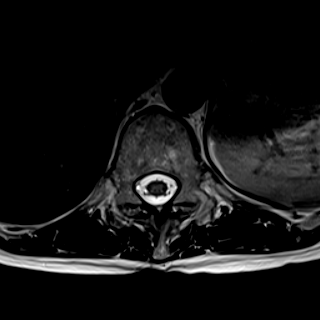
[im 13/42]
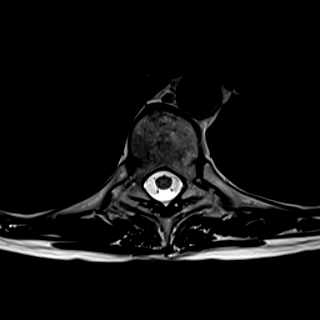
[im 19/42]
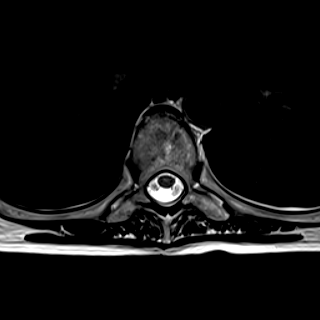
[im 23/42]
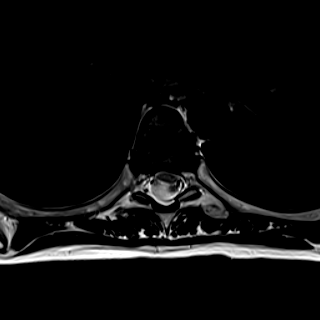
[im 29/42]
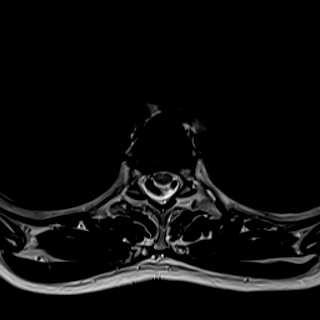
[im 35/42]
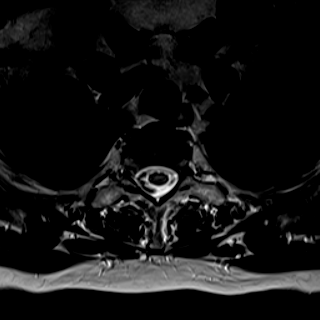
[im 42/42]
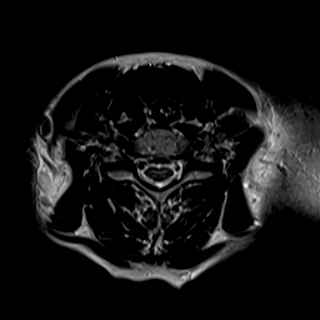

[Series 1006: GRE · axial · 4.0mm · 0.30mm/px · z∈[-393,-278]mm · 3 of 42 slices shown]
[im 1/42]
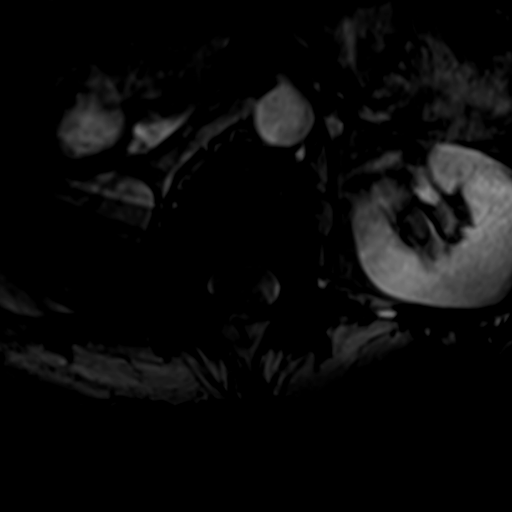
[im 7/42]
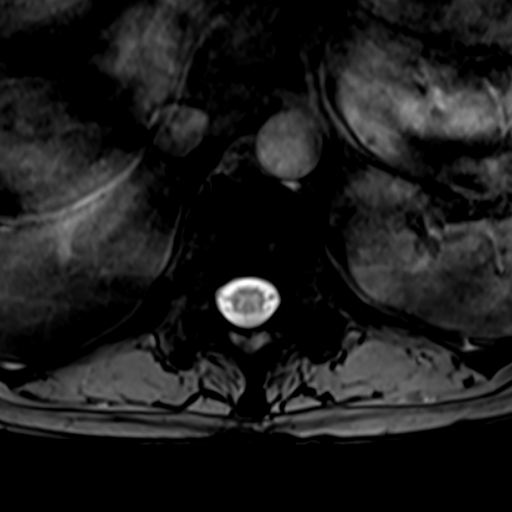
[im 13/42]
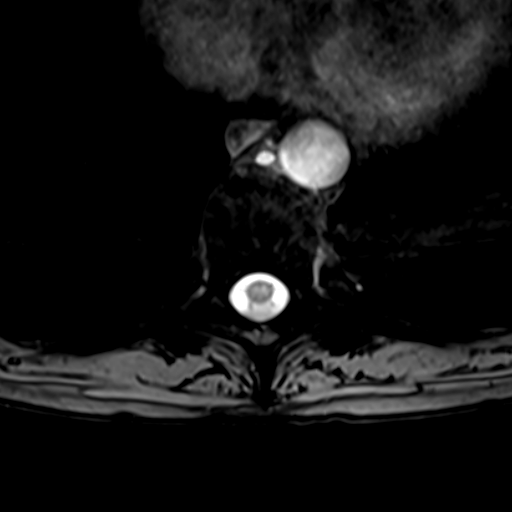

[31 of 48 positions shown; findings below may reference images not displayed]

FINDINGS: MRI CERVICAL SPINE

Alignment: Preserved.

Vertebrae: Vertebral body heights are maintained. There is no marrow
edema. No suspicious osseous lesion.

Cord: No abnormal signal.

Posterior Fossa, vertebral arteries, paraspinal tissues:
Unremarkable.

Disc levels:

C2-C3:  No canal or foraminal stenosis.

C3-C4:  No canal or foraminal stenosis.

C4-C5:  No canal or foraminal stenosis.

C5-C6: Small right central disc protrusion with small endplate
osteophytes. No canal or foraminal stenosis.

C6-C7: Disc bulge with endplate osteophytes eccentric to the left.
No canal or right foraminal stenosis. Mild to moderate left
foraminal stenosis.

C7-T1:  No canal or foraminal stenosis.

MRI THORACIC SPINE

Alignment:  Preserved.

Vertebrae: Vertebral body heights are maintained. No marrow edema.
No suspicious osseous lesion.

Cord:  No abnormal signal.

Paraspinal and other soft tissues: Unremarkable.

Disc levels: Preserved disc heights and signal. Small right central
disc protrusion at T7-T8. No canal or foraminal stenosis at any
level.

MRI LUMBAR SPINE FINDINGS

Segmentation:  Standard.

Alignment:  Preserved.

Vertebrae: Vertebral body heights are maintained apart from mild
degenerative endplate irregularity. Chronic appearing degenerative
endplate marrow changes are present. There is no marrow edema. No
suspicious osseous lesion.

Conus medullaris and cauda equina: Conus extends to the T12-L1
level. Conus and cauda equina appear normal.

Paraspinal and other soft tissues: Unremarkable.

Disc levels:

L1-L2:  No canal or foraminal stenosis.

L2-L3:  No canal or foraminal stenosis.

L3-L4: Disc bulge with superimposed left foraminal protrusion. Mild
facet arthropathy. No canal or right foraminal stenosis. Mild left
foraminal stenosis.

L4-L5: Disc bulge. Mild facet arthropathy. No canal or right
foraminal stenosis. Minor left foraminal stenosis.

L5-S1: Disc bulge with punctate central annular fissure. Mild facet
arthropathy. No canal or foraminal stenosis.
IMPRESSION: Mild degenerative changes without high-grade stenosis.

## 2020-07-11 IMAGING — MR MR CERVICAL SPINE W/O CM
5 series · 38 of 48 positions shown · non-contrast
Comparison: None.

CLINICAL DATA: Neck and back pain, recent subarachnoid hemorrhage

EXAM:
MRI CERVICAL, THORACIC AND LUMBAR SPINE WITHOUT CONTRAST
TECHNIQUE: Multiplanar and multiecho pulse sequences of the cervical spine, to
include the craniocervical junction and cervicothoracic junction,
and thoracic and lumbar spine, were obtained without intravenous
contrast.

[Series 5: T2 · sagittal · 3.0mm · 0.69mm/px · 7 of 13 slices shown (1 of 2)]
[im 1/13]
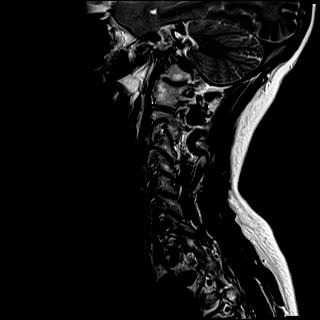
[im 3/13]
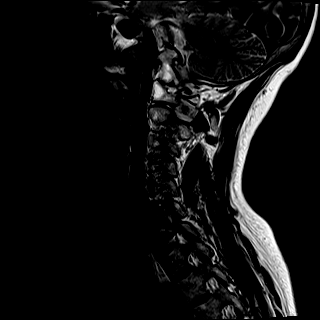
[im 5/13]
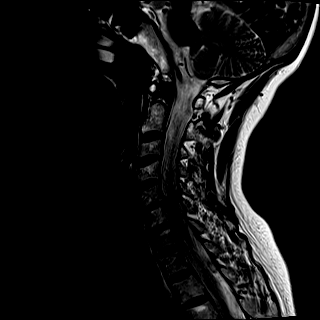
[im 7/13]
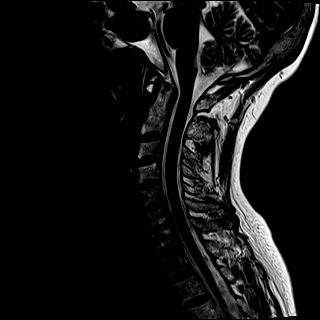
[im 9/13]
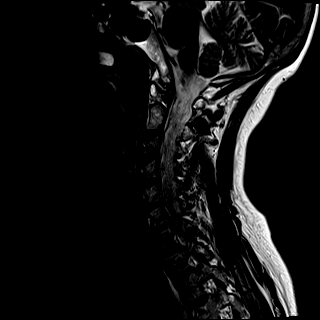
[im 11/13]
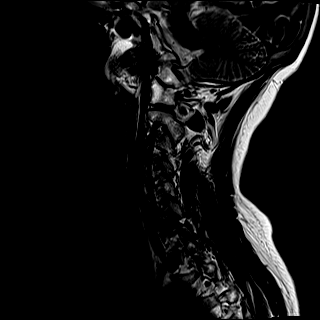
[im 13/13]
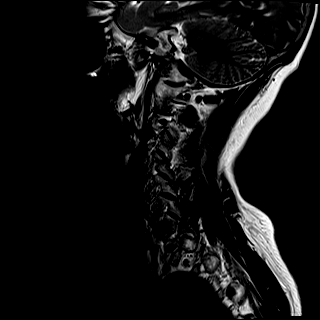

[Series 6: T1 · sagittal · 3.0mm · 0.86mm/px · 7 of 13 slices shown]
[im 1/13]
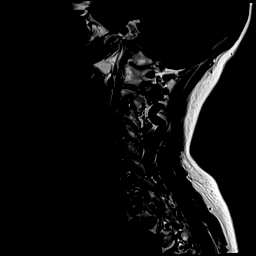
[im 3/13]
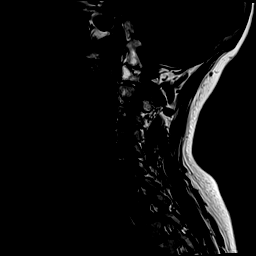
[im 5/13]
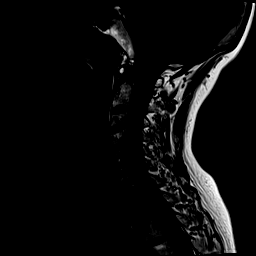
[im 7/13]
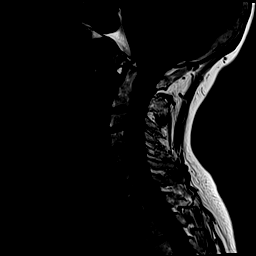
[im 9/13]
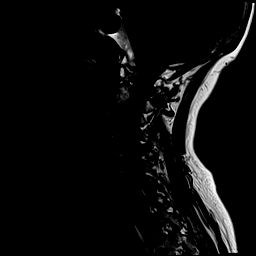
[im 11/13]
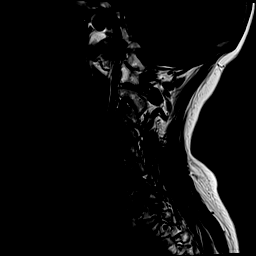
[im 13/13]
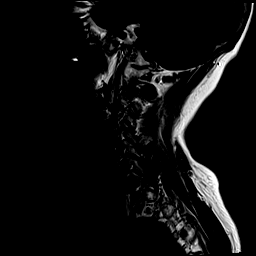

[Series 7: STIR · sagittal · 3.0mm · 0.69mm/px · 8 of 13 slices shown]
[im 1/13]
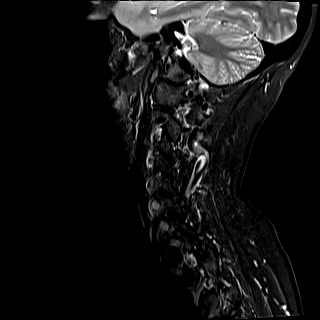
[im 2/13]
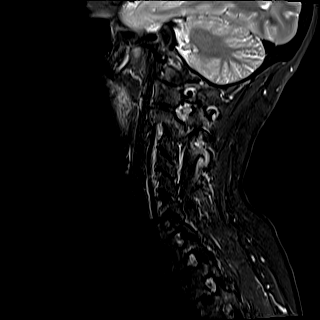
[im 4/13]
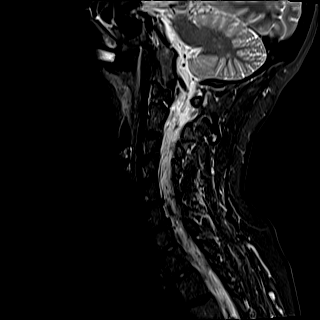
[im 6/13]
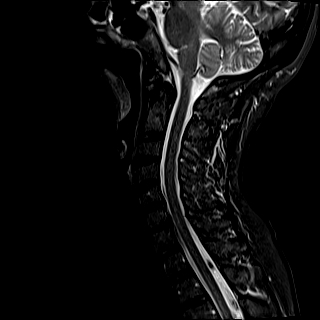
[im 7/13]
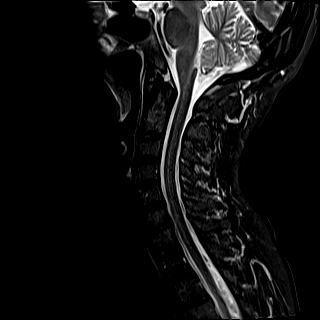
[im 9/13]
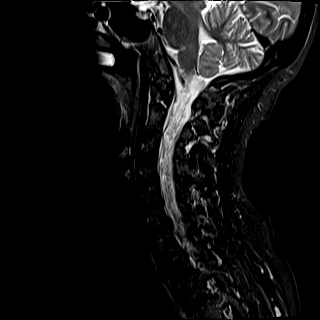
[im 11/13]
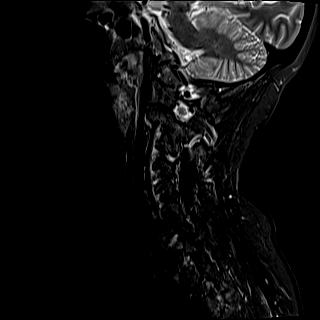
[im 13/13]
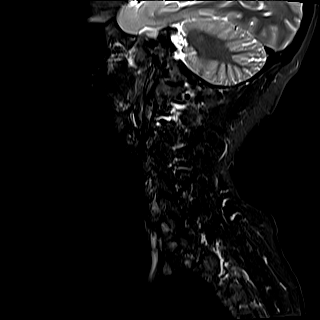

[Series 1013: GRE · axial · 3.0mm · 0.21mm/px · z∈[-122,-67]mm · 7 of 22 slices shown]
[im 1/22]
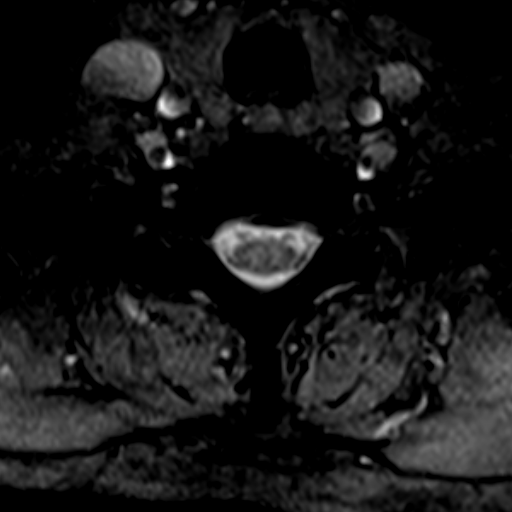
[im 4/22]
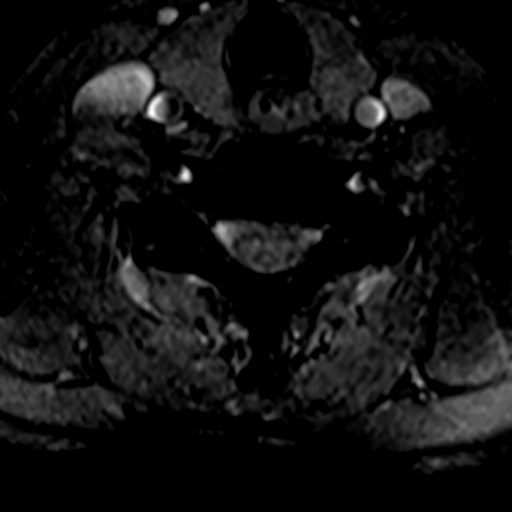
[im 8/22]
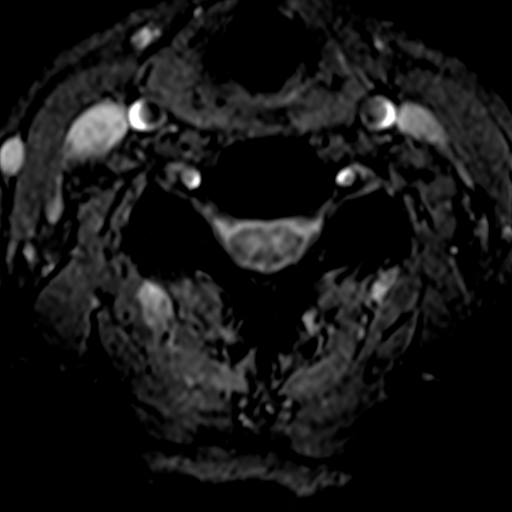
[im 9/22]
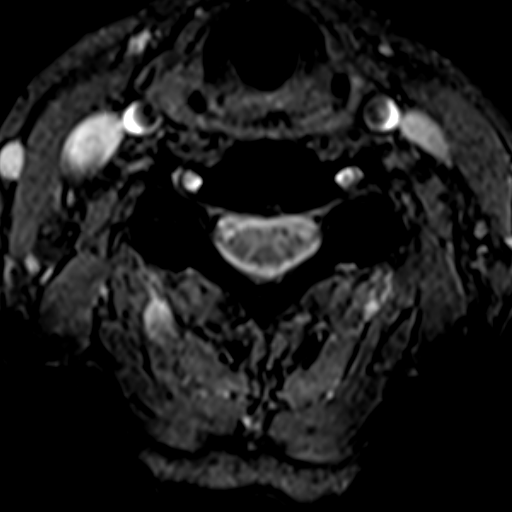
[im 13/22]
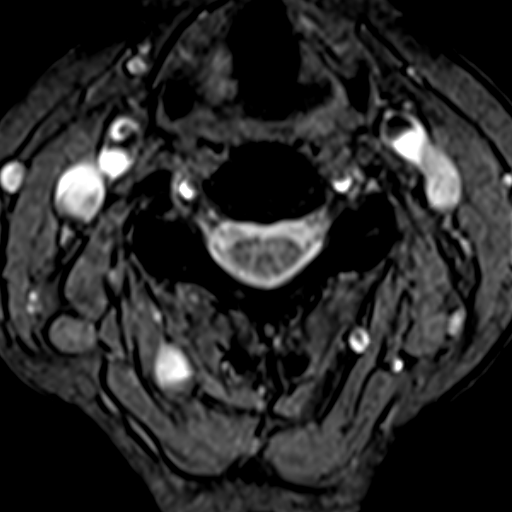
[im 15/22]
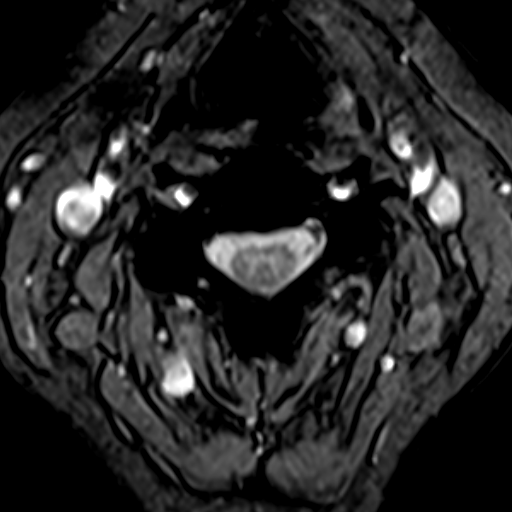
[im 18/22]
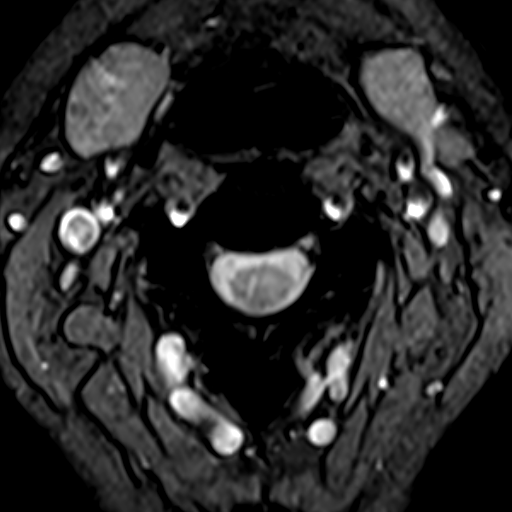

[Series 1015: T2 · axial · 3.0mm · 0.42mm/px · z∈[-116,-44]mm · 9 of 22 slices shown (2 of 2)]
[im 1/22]
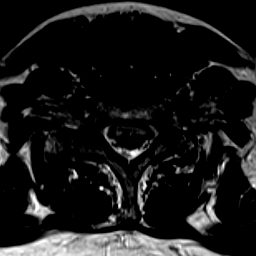
[im 4/22]
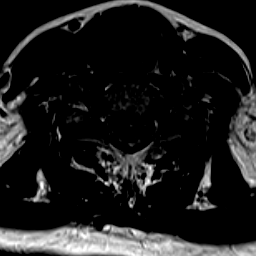
[im 8/22]
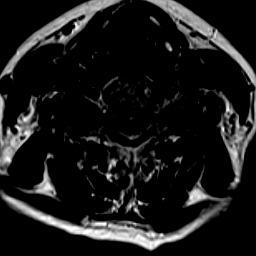
[im 9/22]
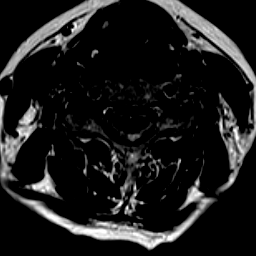
[im 11/22]
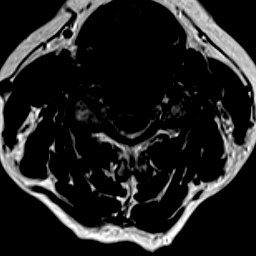
[im 13/22]
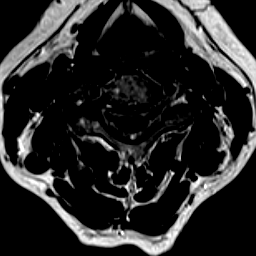
[im 15/22]
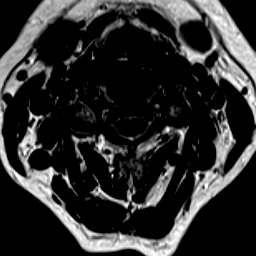
[im 18/22]
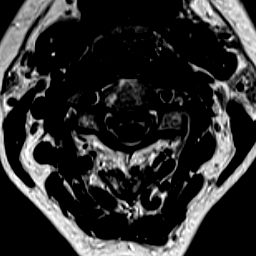
[im 22/22]
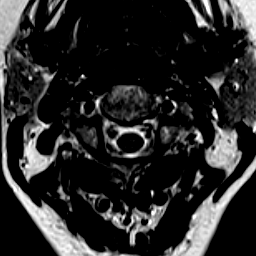

[38 of 48 positions shown; findings below may reference images not displayed]

FINDINGS: MRI CERVICAL SPINE

Alignment: Preserved.

Vertebrae: Vertebral body heights are maintained. There is no marrow
edema. No suspicious osseous lesion.

Cord: No abnormal signal.

Posterior Fossa, vertebral arteries, paraspinal tissues:
Unremarkable.

Disc levels:

C2-C3:  No canal or foraminal stenosis.

C3-C4:  No canal or foraminal stenosis.

C4-C5:  No canal or foraminal stenosis.

C5-C6: Small right central disc protrusion with small endplate
osteophytes. No canal or foraminal stenosis.

C6-C7: Disc bulge with endplate osteophytes eccentric to the left.
No canal or right foraminal stenosis. Mild to moderate left
foraminal stenosis.

C7-T1:  No canal or foraminal stenosis.

MRI THORACIC SPINE

Alignment:  Preserved.

Vertebrae: Vertebral body heights are maintained. No marrow edema.
No suspicious osseous lesion.

Cord:  No abnormal signal.

Paraspinal and other soft tissues: Unremarkable.

Disc levels: Preserved disc heights and signal. Small right central
disc protrusion at T7-T8. No canal or foraminal stenosis at any
level.

MRI LUMBAR SPINE FINDINGS

Segmentation:  Standard.

Alignment:  Preserved.

Vertebrae: Vertebral body heights are maintained apart from mild
degenerative endplate irregularity. Chronic appearing degenerative
endplate marrow changes are present. There is no marrow edema. No
suspicious osseous lesion.

Conus medullaris and cauda equina: Conus extends to the T12-L1
level. Conus and cauda equina appear normal.

Paraspinal and other soft tissues: Unremarkable.

Disc levels:

L1-L2:  No canal or foraminal stenosis.

L2-L3:  No canal or foraminal stenosis.

L3-L4: Disc bulge with superimposed left foraminal protrusion. Mild
facet arthropathy. No canal or right foraminal stenosis. Mild left
foraminal stenosis.

L4-L5: Disc bulge. Mild facet arthropathy. No canal or right
foraminal stenosis. Minor left foraminal stenosis.

L5-S1: Disc bulge with punctate central annular fissure. Mild facet
arthropathy. No canal or foraminal stenosis.
IMPRESSION: Mild degenerative changes without high-grade stenosis.

## 2020-07-11 IMAGING — MR MR LUMBAR SPINE W/O CM
5 series · 31 of 48 positions shown · non-contrast
Comparison: None.

CLINICAL DATA: Neck and back pain, recent subarachnoid hemorrhage

EXAM:
MRI CERVICAL, THORACIC AND LUMBAR SPINE WITHOUT CONTRAST
TECHNIQUE: Multiplanar and multiecho pulse sequences of the cervical spine, to
include the craniocervical junction and cervicothoracic junction,
and thoracic and lumbar spine, were obtained without intravenous
contrast.

[Series 9: T2 · sagittal · 4.0mm · 0.68mm/px · 7 of 15 slices shown (1 of 2)]
[im 1/15]
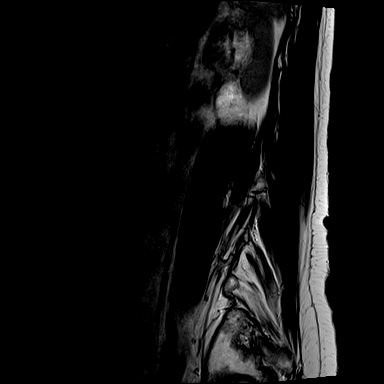
[im 3/15]
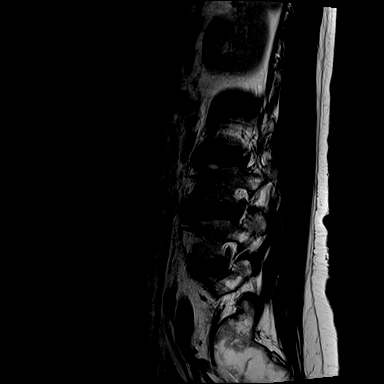
[im 5/15]
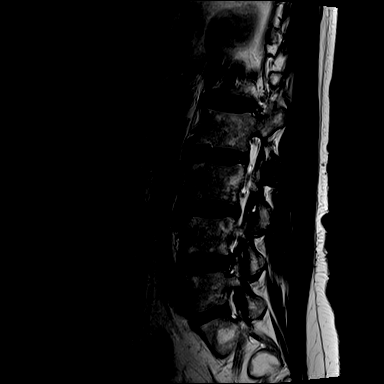
[im 8/15]
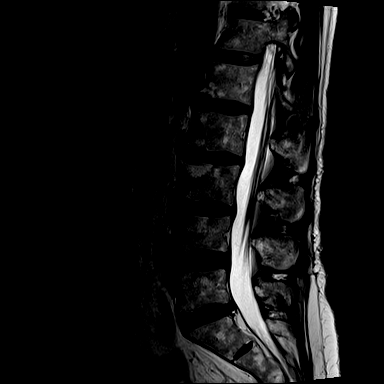
[im 10/15]
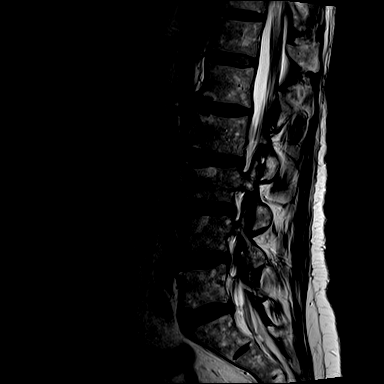
[im 12/15]
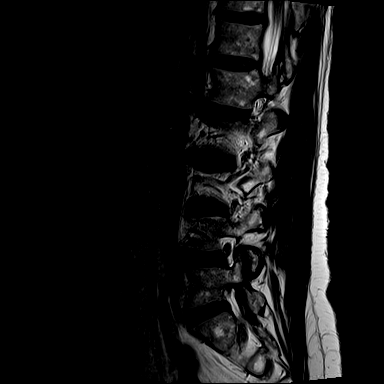
[im 15/15]
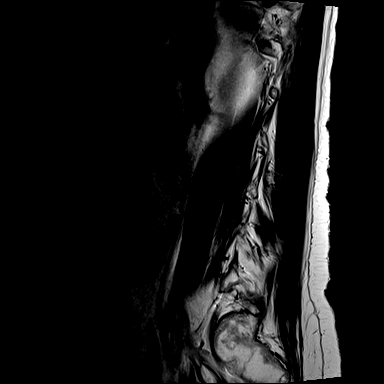

[Series 10: T1 · sagittal · 4.0mm · 0.81mm/px · 7 of 15 slices shown (1 of 2)]
[im 1/15]
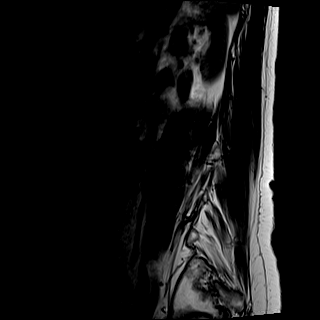
[im 3/15]
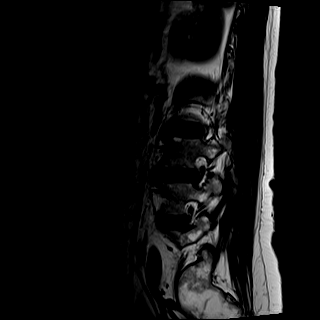
[im 5/15]
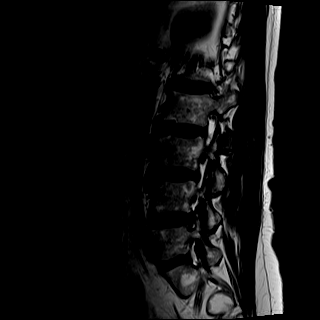
[im 8/15]
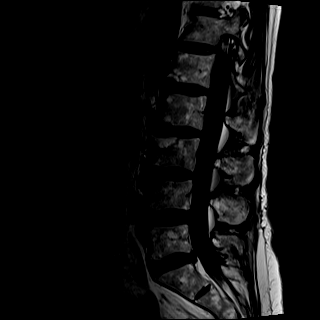
[im 10/15]
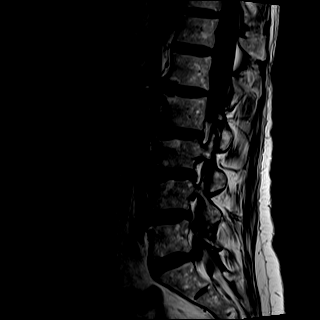
[im 12/15]
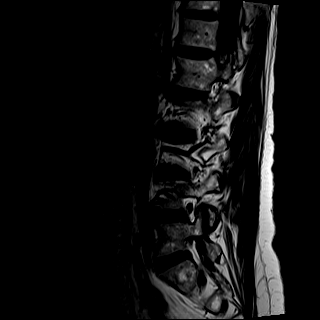
[im 15/15]
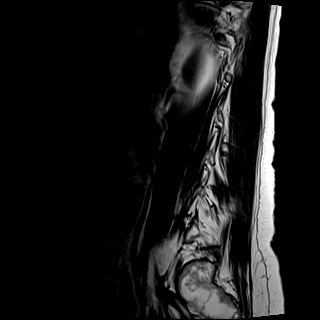

[Series 11: STIR · sagittal · 4.0mm · 0.51mm/px · 1 of 15 slices shown]
[im 1/15]
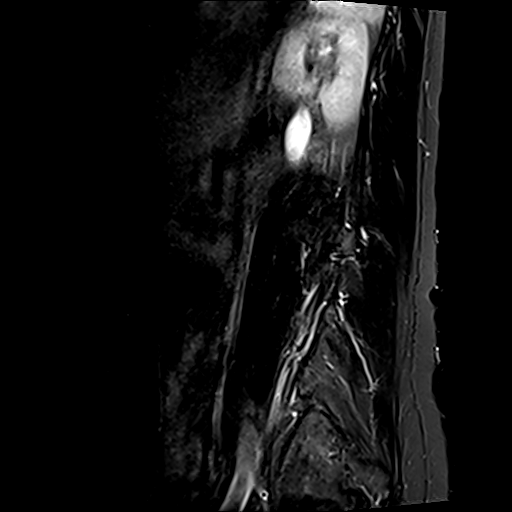

[Series 12: T2 · axial · 4.0mm · 0.70mm/px · z∈[-562,-386]mm · 8 of 33 slices shown (2 of 2)]
[im 1/33]
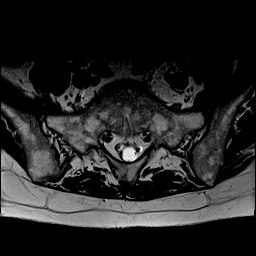
[im 5/33]
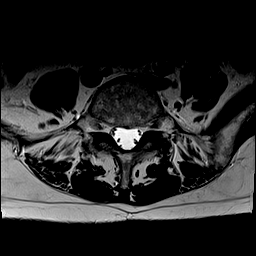
[im 10/33]
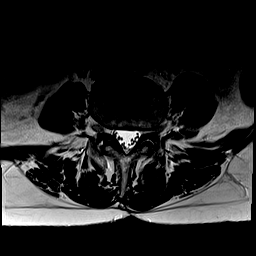
[im 15/33]
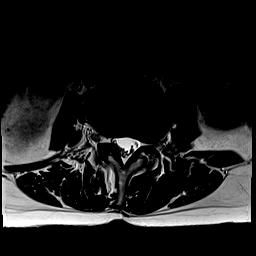
[im 18/33]
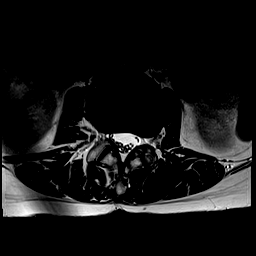
[im 23/33]
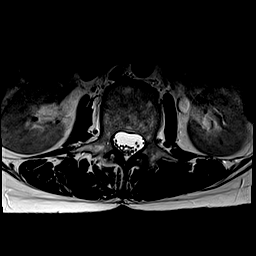
[im 28/33]
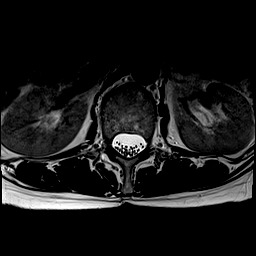
[im 33/33]
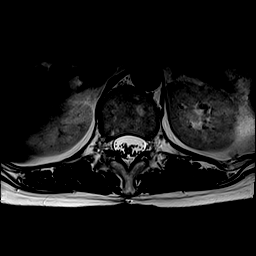

[Series 13: T1 · axial · 4.0mm · 0.35mm/px · z∈[-562,-386]mm · 8 of 33 slices shown (2 of 2)]
[im 1/33]
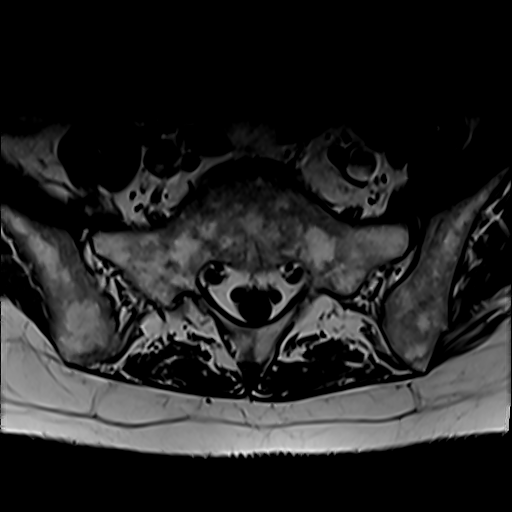
[im 5/33]
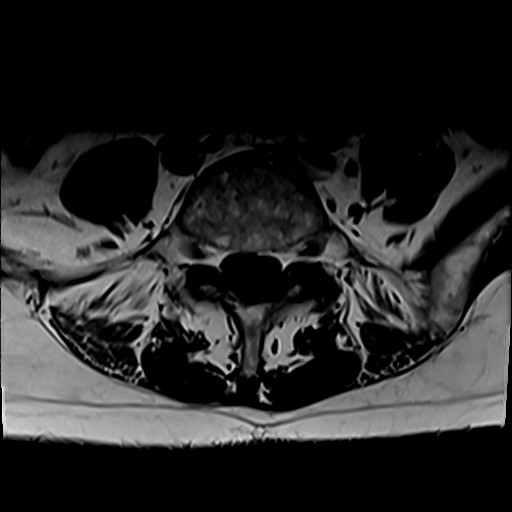
[im 10/33]
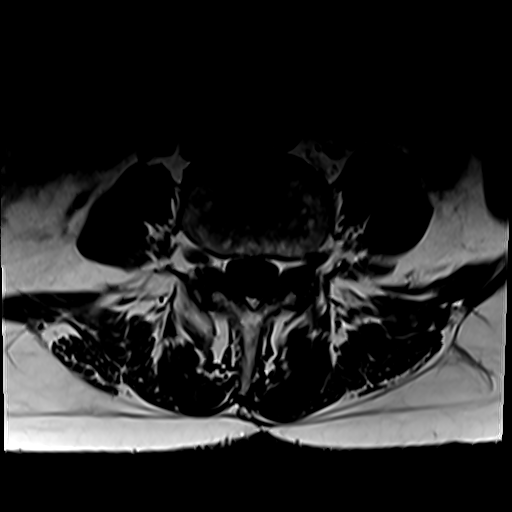
[im 15/33]
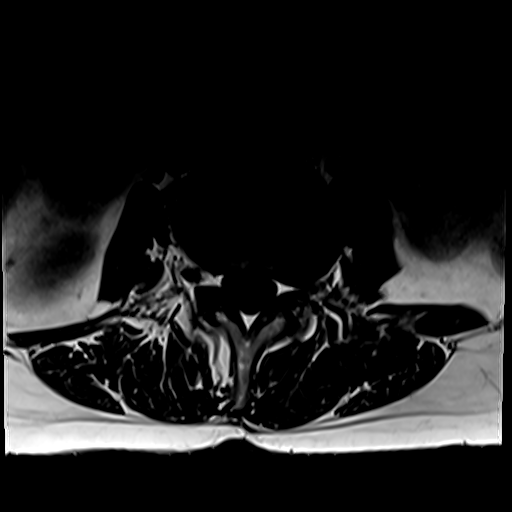
[im 18/33]
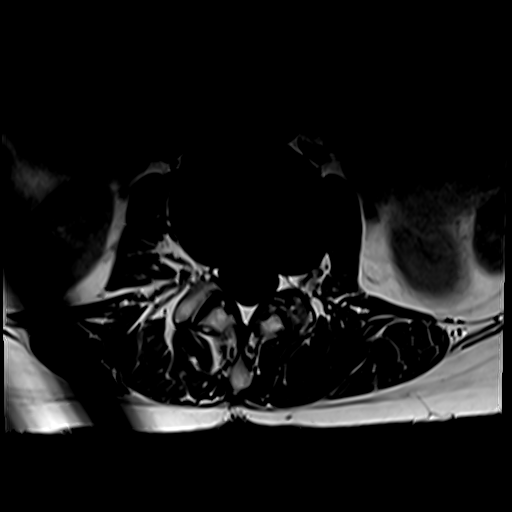
[im 23/33]
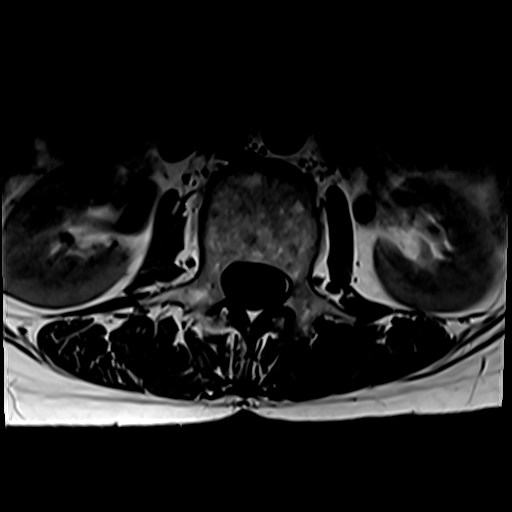
[im 28/33]
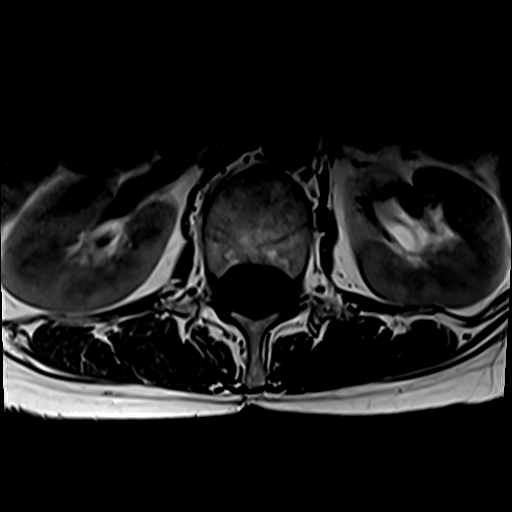
[im 33/33]
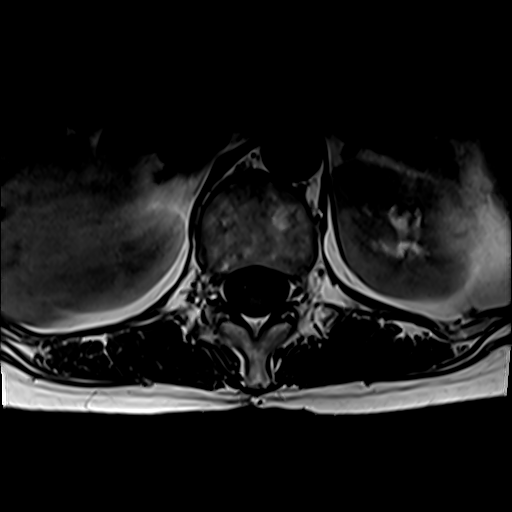

[31 of 48 positions shown; findings below may reference images not displayed]

FINDINGS: MRI CERVICAL SPINE

Alignment: Preserved.

Vertebrae: Vertebral body heights are maintained. There is no marrow
edema. No suspicious osseous lesion.

Cord: No abnormal signal.

Posterior Fossa, vertebral arteries, paraspinal tissues:
Unremarkable.

Disc levels:

C2-C3:  No canal or foraminal stenosis.

C3-C4:  No canal or foraminal stenosis.

C4-C5:  No canal or foraminal stenosis.

C5-C6: Small right central disc protrusion with small endplate
osteophytes. No canal or foraminal stenosis.

C6-C7: Disc bulge with endplate osteophytes eccentric to the left.
No canal or right foraminal stenosis. Mild to moderate left
foraminal stenosis.

C7-T1:  No canal or foraminal stenosis.

MRI THORACIC SPINE

Alignment:  Preserved.

Vertebrae: Vertebral body heights are maintained. No marrow edema.
No suspicious osseous lesion.

Cord:  No abnormal signal.

Paraspinal and other soft tissues: Unremarkable.

Disc levels: Preserved disc heights and signal. Small right central
disc protrusion at T7-T8. No canal or foraminal stenosis at any
level.

MRI LUMBAR SPINE FINDINGS

Segmentation:  Standard.

Alignment:  Preserved.

Vertebrae: Vertebral body heights are maintained apart from mild
degenerative endplate irregularity. Chronic appearing degenerative
endplate marrow changes are present. There is no marrow edema. No
suspicious osseous lesion.

Conus medullaris and cauda equina: Conus extends to the T12-L1
level. Conus and cauda equina appear normal.

Paraspinal and other soft tissues: Unremarkable.

Disc levels:

L1-L2:  No canal or foraminal stenosis.

L2-L3:  No canal or foraminal stenosis.

L3-L4: Disc bulge with superimposed left foraminal protrusion. Mild
facet arthropathy. No canal or right foraminal stenosis. Mild left
foraminal stenosis.

L4-L5: Disc bulge. Mild facet arthropathy. No canal or right
foraminal stenosis. Minor left foraminal stenosis.

L5-S1: Disc bulge with punctate central annular fissure. Mild facet
arthropathy. No canal or foraminal stenosis.
IMPRESSION: Mild degenerative changes without high-grade stenosis.

## 2020-07-16 ENCOUNTER — Ambulatory Visit (HOSPITAL_COMMUNITY): Payer: No Typology Code available for payment source

## 2020-09-09 ENCOUNTER — Ambulatory Visit: Payer: No Typology Code available for payment source | Admitting: Neurology

## 2020-09-26 ENCOUNTER — Ambulatory Visit (HOSPITAL_COMMUNITY)
Admission: RE | Admit: 2020-09-26 | Discharge: 2020-09-26 | Disposition: A | Discharge status: Home / Self Care | Payer: 59 | Source: Ambulatory Visit | Attending: Family Medicine | Admitting: Family Medicine

## 2020-09-26 ENCOUNTER — Other Ambulatory Visit: Payer: Self-pay

## 2020-09-26 DIAGNOSIS — Z13820 Encounter for screening for osteoporosis: Secondary | ICD-10-CM | POA: Insufficient documentation

## 2020-09-26 DIAGNOSIS — Z780 Asymptomatic menopausal state: Secondary | ICD-10-CM | POA: Insufficient documentation

## 2020-09-26 DIAGNOSIS — M8589 Other specified disorders of bone density and structure, multiple sites: Secondary | ICD-10-CM | POA: Insufficient documentation

## 2020-09-26 DIAGNOSIS — E559 Vitamin D deficiency, unspecified: Secondary | ICD-10-CM | POA: Diagnosis not present

## 2020-10-03 ENCOUNTER — Ambulatory Visit: Payer: No Typology Code available for payment source | Admitting: Family Medicine

## 2020-10-03 ENCOUNTER — Telehealth: Payer: No Typology Code available for payment source | Admitting: Family Medicine

## 2020-10-14 ENCOUNTER — Telehealth: Payer: 59 | Admitting: Family Medicine

## 2020-10-14 ENCOUNTER — Other Ambulatory Visit: Payer: Self-pay

## 2020-10-14 VITALS — Ht 63.0 in | Wt 135.0 lb

## 2020-10-14 DIAGNOSIS — M25511 Pain in right shoulder: Secondary | ICD-10-CM

## 2020-10-14 DIAGNOSIS — G8929 Other chronic pain: Secondary | ICD-10-CM | POA: Diagnosis not present

## 2020-10-14 DIAGNOSIS — E785 Hyperlipidemia, unspecified: Secondary | ICD-10-CM

## 2020-10-14 DIAGNOSIS — I609 Nontraumatic subarachnoid hemorrhage, unspecified: Secondary | ICD-10-CM | POA: Diagnosis not present

## 2020-10-14 NOTE — Patient Instructions (Addendum)
Annual exam in office with MD end August, call if you need me sooner  Please schedule mammogram at checkout     Fasting cBC, lipid, cmp and eGFr, tSH and vit d 1 week before ' visit  .Careful not to fall!!  Send message for referal to Ortho if you feel you need this , re your right shoulder   Shoulder Pain Many things can cause shoulder pain, including:  An injury.  Moving the shoulder in the same way again and again (overuse).  Joint pain (arthritis). Pain can come from:  Swelling and irritation (inflammation) of any part of the shoulder.  An injury to the shoulder joint.  An injury to: ? Tissues that connect muscle to bone (tendons). ? Tissues that connect bones to each other (ligaments). ? Bones. Follow these instructions at home: Watch for changes in your symptoms. Let your doctor know about them. Follow these instructions to help with your pain. If you have a sling:  Wear the sling as told by your doctor. Remove it only as told by your doctor.  Loosen the sling if your fingers: ? Tingle. ? Become numb. ? Turn cold and blue.  Keep the sling clean.  If the sling is not waterproof: ? Do not let it get wet. ? Take the sling off when you shower or bathe. Managing pain, stiffness, and swelling  If told, put ice on the painful area: ? Put ice in a plastic bag. ? Place a towel between your skin and the bag. ? Leave the ice on for 20 minutes, 2-3 times a day. Stop putting ice on if it does not help with the pain.  Squeeze a soft ball or a foam pad as much as possible. This prevents swelling in the shoulder. It also helps to strengthen the arm.   General instructions  Take over-the-counter and prescription medicines only as told by your doctor.  Keep all follow-up visits as told by your doctor. This is important. Contact a doctor if:  Your pain gets worse.  Medicine does not help your pain.  You have new pain in your arm, hand, or fingers. Get help  right away if:  Your arm, hand, or fingers: ? Tingle. ? Are numb. ? Are swollen. ? Are painful. ? Turn white or blue. Summary  Shoulder pain can be caused by many things. These include injury, moving the shoulder in the same away again and again, and joint pain.  Watch for changes in your symptoms. Let your doctor know about them.  This condition may be treated with a sling, ice, and pain medicine.  Contact your doctor if the pain gets worse or you have new pain. Get help right away if your arm, hand, or fingers tingle or get numb, swollen, or painful.  Keep all follow-up visits as told by your doctor. This is important. This information is not intended to replace advice given to you by your health care provider. Make sure you discuss any questions you have with your health care provider. Document Revised: 01/11/2018 Document Reviewed: 01/11/2018 Elsevier Patient Education  2021 [employer].  Shoulder Range of Motion Exercises Shoulder range of motion (ROM) exercises are done to keep the shoulder moving freely or to increase movement. They are often recommended for people who have shoulder pain or stiffness or who are recovering from a shoulder surgery. Phase 1 exercises When you are able, do this exercise 1-2 times per day for 30-60 seconds in each direction, or as directed  by your health care provider. Pendulum exercise To do this exercise while sitting: 1. Sit in a chair or at the edge of your bed with your feet flat on the floor. 2. Let your affected arm hang down in front of you over the edge of the bed or chair. 3. Relax your shoulder, arm, and hand. 4. Rock your body so your arm gently swings in small circles. You can also use your unaffected arm to start the motion. 5. Repeat changing the direction of the circles, swinging your arm left and right, and swinging your arm forward and back. To do this exercise while standing: 1. Stand next to a sturdy chair or table, and hold  on to it with your hand on your unaffected side. 2. Bend forward at the waist. 3. Bend your knees slightly. 4. Relax your shoulder, arm, and hand. 5. While keeping your shoulder relaxed, use body motion to swing your arm in small circles. 6. Repeat changing the direction of the circles, swinging your arm left and right, and swinging your arm forward and back. 7. Between exercises, stand up tall and take a short break to relax your lower back.   Phase 2 exercises Do these exercises 1-2 times per day or as told by your health care provider. Hold each stretch for 30 seconds, and repeat 3 times. Do the exercises with one or both arms as instructed by your health care provider. For these exercises, sit at a table with your hand and arm supported by the table. A chair that slides easily or has wheels can be helpful. External rotation 1. Turn your chair so that your affected side is nearest to the table. 2. Place your forearm on the table to your side. Bend your elbow about 90 at the elbow (right angle) and place your hand palm facing down on the table. Your elbow should be about 6 inches away from your side. 3. Keeping your arm on the table, lean your body forward. Abduction 1. Turn your chair so that your affected side is nearest to the table. 2. Place your forearm and hand on the table so that your thumb points toward the ceiling and your arm is straight out to your side. 3. Slide your hand out to the side and away from you, using your unaffected arm to do the work. 4. To increase the stretch, you can slide your chair away from the table. Flexion: forward stretch 1. Sit facing the table. Place your hand and elbow on the table in front of you. 2. Slide your hand forward and away from you, using your unaffected arm to do the work. 3. To increase the stretch, you can slide your chair backward. Phase 3 exercises Do these exercises 1-2 times per day or as told by your health care provider. Hold each  stretch for 30 seconds, and repeat 3 times. Do the exercises with one or both arms as instructed by your health care provider. Cross-body stretch: posterior capsule stretch 1. Lift your arm straight out in front of you. 2. Bend your arm 90 at the elbow (right angle) so your forearm moves across your body. 3. Use your other arm to gently pull the elbow across your body, toward your other shoulder. Wall climbs 1. Stand with your affected arm extended out to the side with your hand resting on a door frame. 2. Slide your hand slowly up the door frame. 3. To increase the stretch, step through the door frame. Keep your body   upright and do not lean. Wand exercises You will need a cane, a piece of PVC pipe, or a sturdy wooden dowel for wand exercises. Flexion To do this exercise while standing: 1. Hold the wand with both of your hands, palms down. 2. Using the other arm to help, lift your arms up and over your head, if able. 3. Push upward with your other arm to gently increase the stretch. To do this exercise while lying down: 1. Lie on your back with your elbows resting on the floor and the wand in both your hands. Your hands will be palm down, or pointing toward your feet. 2. Lift your hands toward the ceiling, using your unaffected arm to help if needed. 3. Bring your arms overhead as able, using your unaffected arm to help if needed. Internal rotation 1. Stand while holding the wand behind you with both hands. Your unaffected arm should be extended above your head with the arm of the affected side extended behind you at the level of your waist. The wand should be pointing straight up and down as you hold it. 2. Slowly pull the wand up behind your back by straightening the elbow of your unaffected arm and bending the elbow of your affected arm. External rotation 1. Lie on your back with your affected upper arm supported on a small pillow or rolled towel. When you first do this exercise, keep  your upper arm close to your body. Over time, bring your arm up to a 90 angle out to the side. 2. Hold the wand across your stomach and with both hands palm up. Your elbow on your affected side should be bent at a 90 angle. 3. Use your unaffected side to help push your forearm away from you and toward the floor. Keep your elbow on your affected side bent at a 90 angle. Contact a health care provider if you have:  New or increasing pain.  New numbness, tingling, weakness, or discoloration in your arm or hand. This information is not intended to replace advice given to you by your health care provider. Make sure you discuss any questions you have with your health care provider. Document Revised: 08/11/2017 Document Reviewed: 08/11/2017 Elsevier Patient Education  2021 [employer].

## 2020-10-14 NOTE — Assessment & Plan Note (Addendum)
3 month h/o limitation in posterior rotation of right shoulder , up to a 5 , will call in for referral, shoulder exercises discussed and sent

## 2020-10-14 NOTE — Progress Notes (Signed)
Virtual Visit via Telephone Note  I connected with Holly Murphy on 10/14/2020 at  1:40 PM EDT by telephone and verified that I am speaking with the correct person using two identifiers.  Location: Patient: home Provider: office   I discussed the limitations, risks, security and privacy concerns of performing an evaluation and management service by telephone and the availability of in person appointments. I also discussed with the patient that there may be a patient responsible charge related to this service. The patient expressed understanding and agreed to proceed.   History of Present Illness: F/U chronic problems Has been released by neurosurgery as far as recent unprovoked sub arachnoid hemorrhage, she has hd no headache, instability, numbness or weakness C/o right shoulder pain, with limitation in movement which is unprovoked   Observations/Objective: Ht 5\' 3"  (1.6 m)   Wt 135 lb (61.2 kg)   BMI 23.91 kg/m  Good communication with no confusion and intact memory. Alert and oriented x 3 No signs of respiratory distress during speech    Assessment and Plan: Chronic right shoulder pain 3 month h/o limitation in posterior rotation of right shoulder , up to a 5 , will call in for referral, shoulder exercises discussed and sent  Dyslipidemia, goal LDL below 100 Hyperlipidemia:Low fat diet discussed and encouraged.   Lipid Panel  Lab Results  Component Value Date   CHOL 217 (H) 02/27/2020   HDL 87 02/27/2020   LDLCALC 115 (H) 02/27/2020   TRIG 48 02/27/2020   CHOLHDL 2.5 02/27/2020  needs to reuce fat in diet Updated lab needed at/ before next visit.      Subarachnoid hemorrhage (HCC) No recurrent symptoms. No underlying pathology on imaging of spine and in hospital workup Discharged from neurosurgery. Has  neurology appt upcoming    Follow Up Instructions:    I discussed the assessment and treatment plan with the patient. The patient was provided an  opportunity to ask questions and all were answered. The patient agreed with the plan and demonstrated an understanding of the instructions.   The patient was advised to call back or seek an in-person evaluation if the symptoms worsen or if the condition fails to improve as anticipated.  I provided 13 minutes of non-face-to-face time during this encounter.   Tula Nakayama, MD

## 2020-10-18 ENCOUNTER — Other Ambulatory Visit (HOSPITAL_COMMUNITY): Payer: Self-pay

## 2020-10-18 MED FILL — Ergocalciferol Cap 1.25 MG (50000 Unit): ORAL | 84 days supply | Qty: 12 | Fill #0 | Status: AC

## 2020-10-26 ENCOUNTER — Encounter: Payer: Self-pay | Admitting: Family Medicine

## 2020-10-26 NOTE — Assessment & Plan Note (Signed)
No recurrent symptoms. No underlying pathology on imaging of spine and in hospital workup Discharged from neurosurgery. Has  neurology appt upcoming

## 2020-10-26 NOTE — Assessment & Plan Note (Signed)
Hyperlipidemia:Low fat diet discussed and encouraged.   Lipid Panel  Lab Results  Component Value Date   CHOL 217 (H) 02/27/2020   HDL 87 02/27/2020   LDLCALC 115 (H) 02/27/2020   TRIG 48 02/27/2020   CHOLHDL 2.5 02/27/2020  needs to reuce fat in diet Updated lab needed at/ before next visit.

## 2020-10-31 ENCOUNTER — Encounter: Payer: Self-pay | Admitting: Neurology

## 2020-10-31 ENCOUNTER — Ambulatory Visit: Payer: 59 | Admitting: Neurology

## 2020-10-31 VITALS — BP 129/80 | HR 70 | Ht 62.5 in | Wt 138.0 lb

## 2020-10-31 DIAGNOSIS — I619 Nontraumatic intracerebral hemorrhage, unspecified: Secondary | ICD-10-CM

## 2020-10-31 DIAGNOSIS — I609 Nontraumatic subarachnoid hemorrhage, unspecified: Secondary | ICD-10-CM | POA: Diagnosis not present

## 2020-10-31 DIAGNOSIS — D699 Hemorrhagic condition, unspecified: Secondary | ICD-10-CM

## 2020-10-31 NOTE — Progress Notes (Signed)
[facility]    Provider:  Dr Jaynee Eagles Requesting Provider: Fayrene Helper, MD Primary Care Provider:  Fayrene Helper, MD  CC:  dizziness  HPI:  Holly Murphy is a 60 y.o. female here as requested by Fayrene Helper, MD for dizziness after subarachnoid hemorrhage.  Past medical history depression, subarachnoid hemorrhage, situational stress.  I reviewed Dr. Joycelyn Schmid Simpson's notes: Patient had recent hospitalization from November 12 to November 18 with a diagnosis of subarachnoid hemorrhage, she remained neurologically stable throughout and imaging studies done at admission and prior to discharge demonstrated no intracranial vascular abnormality, she was discharged on Tylenol for pain, she had a hospital course that included back pain so severe that it radiated down her legs, prednisone helped the most Robaxin and gabapentin were also prescribed.  Prior to the event she had been going to chiropractor for chronic back pain and neck pain.  Patient reports headaches, mild 1-2 in pain, generalized, has dizziness lightheadedness with nausea raising arms above head reaching for something also washing here and bending over.  I also reviewed admission notes patient presented to the hospital November 12 with headache, no pertinent medical history, headache was severe, immediate acute onset while she was working and sitting, she had an episode of muffled hearing, bilateral front of the head headaches, pain is severe, no vision changes, no head trauma, also neck pain.  She had recently started seeing a chiropractor for neck pain and back pain and had adjustments.  She was immediately seen by neurosurgery, initially suspecting a dissection a CT angio was performed, subarachnoid blood was identified on the head CT in the prepontine perimesencephalic region however likely she maintained a normal exam throughout.  CTA on admission did not show any aneurysm, repeat four-vessel  cerebral angiogram was also negative for aneurysm.  She remained neurologically normal throughout hospitalization.  Sitting at work one day, she was on her computer, just sitting, she felt something in the back of her head and neck, she mildly stretched and it was a whoosh, immediate, pain, pressure, she had muffled hearing, she waited a little and didn;t get any better, she started getting nauseated, she denies any vision changes, no numbness, tingling, sensory changes, facial weakness, nothing at all but horrible headache and the hearing changes, nothing made it better or worse, she had nausea, she vomited in the ED once, she had a CT, she had fentanyl an zofran, by the time she got to the ICU. 2 weeks prior had covid booster. 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   Reviewed notes, labs and imaging from outside physicians, which showed:  05/24/2020:  CT head: personally reviewed images  Small-volume acute subarachnoid hemorrhage most notably within the prepontine cistern, left ambient cistern and along the left lateral aspect of the lower pons and medulla.  CTA neck:  1. The common carotid, internal carotid and vertebral arteries are patent within the neck without stenosis. 2. 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 3. Minimal soft plaque within the right carotid bifurcation.  CTA head:  1. No intracranial aneurysm is identified. However, the pattern of subarachnoid hemorrhage remains suspicious for possible aneurysm rupture. Consider catheter based angiography for further evaluation. 2. No intracranial large vessel occlusion or proximal high-grade arterial stenosis.  Imaging 07/11/20: MRI cervical spine:  IMPRESSION: Mild degenerative changes without high-grade stenosis.  Lumbar: IMPRESSION: Mild degenerative changes without high-grade stenosis.  Thoracic: Disc levels: Preserved disc heights and signal. Small right central  disc protrusion  at T7-T8. No canal or foraminal stenosis at any Level.  Review of Systems: Patient complains of symptoms per HPI as well as the following symptoms:dizzy. Pertinent negatives and positives per HPI. All others negative.   Social History   Socioeconomic History  . Marital status: Married    Spouse name: Not on file  . Number of children: 2  . Years of education: Not on file  . Highest education level: Not on file  Occupational History  . Occupation: Facilities manager: [employer]  Tobacco Use  . Smoking status: Never Smoker  . Smokeless tobacco: Never Used  Vaping Use  . Vaping Use: Never used  Substance and Sexual Activity  . Alcohol use: No  . Drug use: No  . Sexual activity: Yes    Birth control/protection: Post-menopausal  Other Topics Concern  . Not on file  Social History Narrative   Lives at home with husband one of 2 children. Other child is away at college   Right handed   Caffeine: 4 cups/day   Social Determinants of Health   Financial Resource Strain: Not on file  Food Insecurity: Not on file  Transportation Needs: Not on file  Physical Activity: Not on file  Stress: Not on file  Social Connections: Not on file  Intimate Partner Violence: Not on file    Family History  Problem Relation Age of Onset  . Diabetes Mother   . Heart attack Mother   . Hypertension Father   . Cancer Maternal Grandmother        uterine  . Heart attack Maternal Grandmother   . Heart attack Maternal Grandfather   . Other Paternal Grandmother        brain tumor  . Heart attack Paternal Grandfather   . Cancer Other        family history   . Diabetes Other        family history   . Heart defect Other        family history   . Arthritis Other        family history     Past Medical History:  Diagnosis Date  . Depression 06/2009  . Hearing loss   . 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 Perimenopausal   . Postmenopausal HRT (hormone replacement therapy)  08/29/2015  . Seasonal allergies   . Situational stress    with Mom's deterioating health . Off celexa for at least 2 months   . Vaginal atrophy 07/26/2015    Patient Active Problem List   Diagnosis Date Noted  . Chronic right shoulder pain 10/14/2020  . Back pain with radiation 06/24/2020  . Neck pain of over 3 months duration 06/24/2020  . Subarachnoid hemorrhage ([place]) 05/24/2020  . Urinary frequency 03/02/2020  . Need for zoster vaccination 08/31/2018  . Hemorrhoids 05/30/2017  . 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 Vaginal atrophy 07/26/2015  . Dyslipidemia, goal LDL below 100 04/16/2014  . Osteopenia 12/27/2013  . Vitamin D deficiency 11/15/2010  . Backache 04/18/2010  . NONSPECIFIC ABNORMAL ELECTROCARDIOGRAM 12/02/2009    Past Surgical History:  Procedure Laterality Date  . COLONOSCOPY N/A 05/24/2013   Procedure: COLONOSCOPY;  Surgeon: Rogene Houston, MD;  Location: [facility];  Service: Endoscopy;  Laterality: N/A;  930  . IR ANGIO INTRA EXTRACRAN SEL COM CAROTID INNOMINATE BILAT MOD SED  05/30/2020  . IR ANGIO VERTEBRAL SEL SUBCLAVIAN INNOMINATE BILAT MOD SED  05/30/2020  Current Outpatient Medications  Medication Sig Dispense Refill  . acetaminophen (TYLENOL) 500 MG tablet Take 500 mg by mouth every 6 (six) hours as needed for mild pain.    . Calcium Citrate-Vitamin D (CALCIUM + D PO) Take by mouth in the morning and at bedtime. Calcium 500 mg and Vitamin D 2000 units    . ergocalciferol (VITAMIN D2) 1.25 MG (50000 UT) capsule Take 1 capsule (50,000 Units total) by mouth once a week. One capsule once weekly 12 capsule 2  . melatonin 5 MG TABS Take 5 mg by mouth at bedtime as needed (For sleep).     No current facility-administered medications for this visit.    Allergies as of 10/31/2020  . (No Known Allergies)    Vitals: BP 129/80 (BP Location: Right Arm, Patient Position: Sitting)   Pulse 70   Ht 5' 2.5" (1.588 m)   Wt 138 lb (62.6 kg)   BMI  24.84 kg/m  Last Weight:  Wt Readings from Last 1 Encounters:  10/31/20 138 lb (62.6 kg)   Last Height:   Ht Readings from Last 1 Encounters:  10/31/20 5' 2.5" (1.588 m)     Physical exam: Exam: Gen: NAD, conversant, well nourised, well groomed                     CV: RRR, no MRG. No Carotid Bruits. No peripheral edema, warm, nontender Eyes: Conjunctivae clear without exudates or hemorrhage  Neuro: Detailed Neurologic Exam  Speech:    Speech is normal; fluent and spontaneous with normal comprehension.  Cognition:    The patient is oriented to person, place, and time;     recent and remote memory intact;     language fluent;     normal attention, concentration,     fund of knowledge Cranial Nerves:    The pupils are equal, round, and reactive to light. The fundi are normal and spontaneous venous pulsations are present. Visual fields are full to finger confrontation. Extraocular movements are intact. Trigeminal sensation is intact and the muscles of mastication are normal. The face is symmetric. The palate elevates in the midline. Hearing intact. Voice is normal. Shoulder shrug is normal. The tongue has normal motion without fasciculations.   Coordination:    Normal    Gait:     normal.   Motor Observation:    No asymmetry, no atrophy, and no involuntary movements noted. Tone:    Normal muscle tone.    Posture:    Posture is normal. normal erect    Strength:    Strength is V/V in the upper and lower limbs.      Sensation: intact to LT     Reflex Exam:  DTR's:    Deep tendon reflexes in the upper and lower extremities are normal bilaterally.   Toes:    The toes are downgoing bilaterally.   Clonus:    Clonus is absent.    Assessment/Plan:  Very unusual case of a 60 year old healthy female with a nontraumatic subarachnoid hemorrhage.  The hemorrhage was at the prepontine cistern, left ambient cistern and along the left lateral aspect of the lower pons and  medulla.  No etiology was found inpatient, CT of the head, CTA of the head and neck and 4-vessel cerebral angiogram  showed no etiology, no aneurysm, no blood vessel malformation.  She did have a COVID-vaccine 2 weeks prior.   I will perform a thorough evaluation for possible causes such as vasculitides, other  systemic rheumatologic diseases, hypercoagulable states including paraneoplastic (may consider CT abdomen pelvis for malignancy scan), conditions associated with reversible cerebral vasoconstriction syndrome.  If workup below is unremarkable, will consider ct abd/chest/pelvis for a malignancy screen.  Orders Placed This Encounter  Procedures  . MR BRAIN W WO CONTRAST  . Antithrombin III  . Protein C activity  . Protein C, total  . Protein S activity  . Protein S, total  . Lupus anticoagulant  . Beta-2-glycoprotein i abs, IgG/M/A  . Homocysteine  . Factor V Leiden  . CBC with Differential/Platelets  . Comprehensive metabolic panel  . Prothrombin gene mutation  . Cardiolipin antibodies, IgG, IgM, IgA  . Sedimentation rate  . TSH  . Pan-ANCA  . C3 complement  . C4 complement  . Angiotensin converting enzyme  . Anti-DNA antibody, double-stranded  . ANA  . ANA, IFA (with reflex)  . Rheumatoid factor  . HIV Antibody (routine testing w rflx)  . RPR  . Sjogren's syndrome antibods(ssa + ssb)  . Anti-Smith antibody   No orders of the defined types were placed in this encounter.   Cc: Fayrene Helper, MD,  Fayrene Helper, MD  Sarina Ill, MD  [facility] [address] [place], [place] [address]  Phone [phone] Fax [phone]

## 2020-10-31 NOTE — Patient Instructions (Signed)
MRI of the brain Complete blood panel Pan scan thorax/abdomen/pelvis   Subarachnoid Hemorrhage Subarachnoid hemorrhage is bleeding in the area between the brain and the membrane that covers the brain (subarachnoid space). This bleeding increases the pressure on the brain and decreases blood flow to certain areas of the brain. Subarachnoid hemorrhage is a medical emergency. If this condition is not treated, it may cause permanent brain damage, stroke, or even death. What are the causes? This condition may be caused by:  A weakened blood vessel in the brain that bursts (ruptured brain aneurysm).  A head injury (trauma).  Bleeding from blood vessels that have developed abnormally (arteriovenous malformation).  A bleeding disorder.  Use of blood thinners (anticoagulants).  Use of certain drugs, such as cocaine. In some cases, the cause is not known.   What increases the risk? You are more likely to develop this condition if:  You smoke.  You have high blood pressure (hypertension).  You drink too much alcohol.  You have a family history of brain aneurysm.  You are older than age 84.  You are female, especially if you have gone through menopause.  You have a certain genetic syndrome that results in kidney disease (autosomal dominant polycystic kidney disease) or connective tissue disease.  You use cocaine. What are the signs or symptoms? Symptoms of this condition include:  A sudden, severe headache. The headache is often described as the worst headache ever experienced.  Nausea or vomiting, especially when combined with other symptoms such as a headache.  Changes in your mental status such as: ? Loss of consciousness. ? Sudden confusion. ? Drowsiness.  Stiff neck.  Changes in your vision such as: ? Sensitivity to light. ? Sudden trouble seeing out of one or both eyes.  Sudden weakness or numbness of the face, arm, or leg, especially on one side of the  body.  Sudden trouble walking or difficulty moving an arm or leg.  Trouble speaking (expressive aphasia) or understanding speech (receptive aphasia).  Difficulty swallowing.  Dizziness and loss of balance or coordination. How is this diagnosed? This condition is diagnosed based on a physical exam and your symptoms. You may have tests, such as:  CT scan.  MRI.  Cerebral angiogram. A procedure to examine the blood vessels in the brain and neck. For this test, a dye is injected into your bloodstream before X-rays are taken. The dye makes the blood vessels easier to see.  Lumbar puncture. A procedure to remove and examine a small amount of the fluid that surrounds the brain and spinal cord.  Blood tests.  Transcranial Doppler. A procedure that uses ultrasound to monitor blood flow in the brain. How is this treated? This condition often requires immediate treatment in the hospital to lower the risk of brain damage. Treatment depends on the cause, severity, and location of the bleeding and any damage that it has caused. Treatment goals are to stop bleeding, repair the cause of bleeding, relieve symptoms, and prevent problems. Treatment may include:  Medicines that: ? Reverse the effects of blood thinners, if you were taking blood thinners before the subarachnoid hemorrhage. ? Lower the blood pressure (antihypertensives). ? Relieve pain (analgesics). ? Relieve nausea or vomiting. ? Prevent seizures.  Surgery to stop bleeding, repair the cause of the bleeding, or remove blood that has collected. This may involve: ? A procedure that is done from inside the blood vessel, in which the aneurysm is filled with small, platinum coils (endovascular coiling). ? Opening the  skull (craniotomy) to reach the aneurysm and put a clip at the base of the aneurysm (surgical clipping).  Surgery to relieve pressure on the brain by placing a tube (external ventricular drain, EVD) in the brain to drain  blood.  Physical, occupational, or speech-language therapy to improve any mental (cognitive) and day-to-day functions that are affected by your condition. Other treatment depends on the cause and severity of symptoms, and how long the symptoms have lasted. Actions may be taken to prevent short-term and long-term problems, including lung infection (pneumonia) and blood clots in your legs. Follow these instructions at home: Medicines  Take over-the-counter and prescription medicines only as told by your health care provider.  Ask your health care provider if the medicine prescribed to you requires you to avoid driving or using machinery.  Do not take any medicines that contain aspirin or NSAIDs, such as ibuprofen, unless your health care provider approves. Lifestyle  Rest and limit activities as directed. Rest helps the brain to heal. Make sure you: ? Get plenty of sleep. ? Avoid activities that cause physical or mental stress.  Do not use any products that contain nicotine or tobacco, such as cigarettes, e-cigarettes, or chewing tobacco. If you need help quitting, ask your health care provider. General instructions  Do physical, occupational, and speech-language therapy as recommended.  Follow instructions from your health care provider about whether eating and drinking are safe for you. You may need tests to make sure that you can swallow safely (swallow studies).  Check and write down your blood pressure as told by your health care provider.  Do not drive until your health care provider says that it is safe to drive.  Keep all follow-up visits as told by your health care provider. This is important. Where to find more information  American Stroke Association: www.stroke.org Contact a health care provider if:  You have a stiff neck.  You have a cough.  You have a fever. Get help right away if:  You have any symptoms of a stroke. "BE FAST" is an easy way to remember the main  warning signs of a stroke: ? B - Balance. Signs are dizziness, sudden trouble walking, or loss of balance. ? E - Eyes. Signs are trouble seeing or a sudden change in vision. ? F - Face. Signs are sudden weakness or numbness of the face, or the face or eyelid drooping on one side. ? A - Arms. Signs are weakness or numbness in an arm. This happens suddenly and usually on one side of the body. ? S - Speech. Signs are sudden trouble speaking, slurred speech, or trouble understanding what people say. ? T - Time. Time to call emergency services. Write down what time symptoms started.  You have other signs of a stroke, such as: ? A sudden, severe headache with no known cause. ? Nausea or vomiting. ? Seizure. These symptoms may represent a serious problem that is an emergency. Do not wait to see if the symptoms will go away. Get medical help right away. Call your local emergency services (911 in the [place] Do not drive yourself to the hospital. Summary  Subarachnoid hemorrhage is bleeding in the area between the brain and the membrane that covers the brain (subarachnoid space).  Subarachnoid hemorrhage is a medical emergency. Treatment may include procedures to stop bleeding or reduce pressure in the skull, and medicines to prevent complications.  Follow instructions from your health care provider about diet restrictions, activity restrictions, and medicines.  This information is not intended to replace advice given to you by your health care provider. Make sure you discuss any questions you have with your health care provider. Document Revised: 08/15/2019 Document Reviewed: 08/15/2019 Elsevier Patient Education  2021 [employer].

## 2020-11-04 ENCOUNTER — Telehealth: Payer: Self-pay | Admitting: Neurology

## 2020-11-04 NOTE — Telephone Encounter (Signed)
I called patient and we scheduled MR brain w/wo contrast for 5/4 at 8:00. Patient has [employer], no PA required.   Patient states that her blood work appears to be normal so she is requesting if she can hold off on CT testing due to cost (patient states that Dr. Jaynee Eagles mentioned this).

## 2020-11-04 NOTE — Telephone Encounter (Signed)
Holly Murphy, Please let her know that is totally fine thanks

## 2020-11-09 LAB — PROTEIN S, TOTAL: Protein S Ag, Total: 111 % (ref 60–150)

## 2020-11-09 LAB — PAN-ANCA
ANCA Proteinase 3: <3.5 U/mL (ref 0.0–3.5)
Atypical pANCA: <1:20 {titer}
C-ANCA: <1:20 {titer}
Myeloperoxidase Ab: <9 U/mL (ref 0.0–9.0)
P-ANCA: <1:20 {titer}

## 2020-11-09 LAB — ANTI-DNA ANTIBODY, DOUBLE-STRANDED: dsDNA Ab: <1 IU/mL (ref 0–9)

## 2020-11-09 LAB — FACTOR V LEIDEN

## 2020-11-09 LAB — CBC WITH DIFFERENTIAL/PLATELET
Basophils Absolute: 0 10*3/uL (ref 0.0–0.2)
Basos: 0 %
EOS (ABSOLUTE): 0.1 10*3/uL (ref 0.0–0.4)
Eos: 1 %
Hematocrit: 41.5 % (ref 34.0–46.6)
Hemoglobin: 14.2 g/dL (ref 11.1–15.9)
Immature Grans (Abs): 0 10*3/uL (ref 0.0–0.1)
Immature Granulocytes: 0 %
Lymphocytes Absolute: 1.6 10*3/uL (ref 0.7–3.1)
Lymphs: 27 %
MCH: 31.8 pg (ref 26.6–33.0)
MCHC: 34.2 g/dL (ref 31.5–35.7)
MCV: 93 fL (ref 79–97)
Monocytes Absolute: 0.3 10*3/uL (ref 0.1–0.9)
Monocytes: 6 %
Neutrophils Absolute: 3.8 10*3/uL (ref 1.4–7.0)
Neutrophils: 66 %
Platelets: 259 10*3/uL (ref 150–450)
RBC: 4.46 x10E6/uL (ref 3.77–5.28)
RDW: 12.5 % (ref 11.7–15.4)
WBC: 5.8 10*3/uL (ref 3.4–10.8)

## 2020-11-09 LAB — COMPREHENSIVE METABOLIC PANEL
ALT: 11 IU/L (ref 0–32)
AST: 16 IU/L (ref 0–40)
Albumin/Globulin Ratio: 1.8 (ref 1.2–2.2)
Albumin: 4.8 g/dL (ref 3.8–4.9)
Alkaline Phosphatase: 69 IU/L (ref 44–121)
BUN/Creatinine Ratio: 19 (ref 9–23)
BUN: 11 mg/dL (ref 6–24)
Bilirubin Total: 0.2 mg/dL (ref 0.0–1.2)
CO2: 23 mmol/L (ref 20–29)
Calcium: 9.7 mg/dL (ref 8.7–10.2)
Chloride: 103 mmol/L (ref 96–106)
Creatinine, Ser: 0.58 mg/dL (ref 0.57–1.00)
Globulin, Total: 2.7 g/dL (ref 1.5–4.5)
Glucose: 92 mg/dL (ref 65–99)
Potassium: 4.2 mmol/L (ref 3.5–5.2)
Sodium: 142 mmol/L (ref 134–144)
Total Protein: 7.5 g/dL (ref 6.0–8.5)
eGFR: 105 mL/min/{1.73_m2} (ref >59)

## 2020-11-09 LAB — PROTEIN C ACTIVITY: Protein C Activity: 198 % — ABNORMAL HIGH (ref 73–180)

## 2020-11-09 LAB — RPR: RPR Ser Ql: NONREACTIVE

## 2020-11-09 LAB — LUPUS ANTICOAGULANT
Dilute Viper Venom Time: 30.6 s (ref 0.0–47.0)
PTT Lupus Anticoagulant: 36.4 s (ref 0.0–51.9)
Thrombin Time: 15.6 s (ref 0.0–23.0)
dPT Confirm Ratio: 0.85 Ratio (ref 0.00–1.34)
dPT: 31.7 s (ref 0.0–47.6)

## 2020-11-09 LAB — RHEUMATOID FACTOR: Rheumatoid fact SerPl-aCnc: <10 IU/mL (ref <14.0)

## 2020-11-09 LAB — C3 COMPLEMENT: Complement C3, Serum: 145 mg/dL (ref 82–167)

## 2020-11-09 LAB — HOMOCYSTEINE: Homocysteine: 6.9 umol/L (ref 0.0–14.5)

## 2020-11-09 LAB — ANA: ANA Titer 1: NEGATIVE

## 2020-11-09 LAB — BETA-2-GLYCOPROTEIN I ABS, IGG/M/A
Beta-2 Glyco 1 IgA: <9 GPI IgA units (ref 0–25)
Beta-2 Glyco 1 IgM: <9 GPI IgM units (ref 0–32)
Beta-2 Glyco I IgG: <9 GPI IgG units (ref 0–20)

## 2020-11-09 LAB — ANTITHROMBIN III: AntiThromb III Func: 131 % (ref 75–135)

## 2020-11-09 LAB — HIV ANTIBODY (ROUTINE TESTING W REFLEX): HIV Screen 4th Generation wRfx: NONREACTIVE

## 2020-11-09 LAB — SEDIMENTATION RATE: Sed Rate: 8 mm/hr (ref 0–40)

## 2020-11-09 LAB — PROTEIN S ACTIVITY: Protein S Activity: 91 % (ref 63–140)

## 2020-11-09 LAB — C4 COMPLEMENT: Complement C4, Serum: 27 mg/dL (ref 12–38)

## 2020-11-09 LAB — PROTHROMBIN GENE MUTATION

## 2020-11-09 LAB — ANTI-SMITH ANTIBODY: ENA SM Ab Ser-aCnc: <0.2 AI (ref 0.0–0.9)

## 2020-11-09 LAB — ANGIOTENSIN CONVERTING ENZYME: Angio Convert Enzyme: 33 U/L (ref 14–82)

## 2020-11-09 LAB — SJOGREN'S SYNDROME ANTIBODS(SSA + SSB)
ENA SSA (RO) Ab: <0.2 AI (ref 0.0–0.9)
ENA SSB (LA) Ab: <0.2 AI (ref 0.0–0.9)

## 2020-11-09 LAB — PROTEIN C, TOTAL: Protein C Antigen: 152 % — ABNORMAL HIGH (ref 60–150)

## 2020-11-09 LAB — TSH: TSH: 0.972 u[IU]/mL (ref 0.450–4.500)

## 2020-11-13 ENCOUNTER — Ambulatory Visit: Payer: 59

## 2020-11-13 DIAGNOSIS — I619 Nontraumatic intracerebral hemorrhage, unspecified: Secondary | ICD-10-CM | POA: Diagnosis not present

## 2020-11-13 DIAGNOSIS — D699 Hemorrhagic condition, unspecified: Secondary | ICD-10-CM | POA: Diagnosis not present

## 2020-11-13 DIAGNOSIS — I609 Nontraumatic subarachnoid hemorrhage, unspecified: Secondary | ICD-10-CM | POA: Diagnosis not present

## 2020-11-13 MED ORDER — GADOBENATE DIMEGLUMINE 529 MG/ML IV SOLN
10.0000 mL | Freq: Once | INTRAVENOUS | Status: AC | PRN
  Administered 2020-11-13: 10 mL via INTRAVENOUS

## 2020-12-07 DIAGNOSIS — H5203 Hypermetropia, bilateral: Secondary | ICD-10-CM | POA: Diagnosis not present

## 2020-12-12 ENCOUNTER — Telehealth: Payer: 59 | Admitting: Neurology

## 2021-03-25 ENCOUNTER — Other Ambulatory Visit (HOSPITAL_COMMUNITY): Payer: Self-pay

## 2021-06-11 ENCOUNTER — Other Ambulatory Visit: Payer: Self-pay

## 2021-06-11 ENCOUNTER — Encounter: Payer: Self-pay | Admitting: Family Medicine

## 2021-06-11 ENCOUNTER — Encounter: Payer: Self-pay | Admitting: Internal Medicine

## 2021-06-11 ENCOUNTER — Other Ambulatory Visit (HOSPITAL_COMMUNITY): Payer: Self-pay

## 2021-06-11 ENCOUNTER — Ambulatory Visit (INDEPENDENT_AMBULATORY_CARE_PROVIDER_SITE_OTHER): Payer: 59 | Admitting: Internal Medicine

## 2021-06-11 DIAGNOSIS — W57XXXA Bitten or stung by nonvenomous insect and other nonvenomous arthropods, initial encounter: Secondary | ICD-10-CM

## 2021-06-11 DIAGNOSIS — T7840XA Allergy, unspecified, initial encounter: Secondary | ICD-10-CM

## 2021-06-11 MED ORDER — DOXYCYCLINE HYCLATE 100 MG PO TABS
100.0000 mg | ORAL_TABLET | Freq: Two times a day (BID) | ORAL | 0 refills | Status: DC
  Filled 2021-06-11: qty 10, 5d supply, fill #0

## 2021-06-11 MED ORDER — METHYLPREDNISOLONE 4 MG PO TBPK
ORAL_TABLET | ORAL | 0 refills | Status: DC
  Filled 2021-06-11: qty 21, 6d supply, fill #0

## 2021-06-11 NOTE — Patient Instructions (Signed)
Please start taking doxycycline as prescribed.  Please take Medrol if rash does not improve with Benadryl and cortisone cream.

## 2021-06-11 NOTE — Progress Notes (Signed)
Virtual Visit via Telephone Note   This visit type was conducted due to national recommendations for restrictions regarding the COVID-19 Pandemic (e.g. social distancing) in an effort to limit this patient's exposure and mitigate transmission in our community.  Due to her co-morbid illnesses, this patient is at least at moderate risk for complications without adequate follow up.  This format is felt to be most appropriate for this patient at this time.  The patient did not have access to video technology/had technical difficulties with video requiring transitioning to audio format only (telephone).  All issues noted in this document were discussed and addressed.  No physical exam could be performed with this format.  Evaluation Performed:  Follow-up visit  Date:  06/11/2021   ID:  Dawnette, Mione 1960-11-11, MRN [id number]  Patient Location: Home Provider Location: Office/Clinic  Participants: Patient Location of Patient: Home Location of Provider: Telehealth Consent was obtain for visit to be over via telehealth. I verified that I am speaking with the correct person using two identifiers.  PCP:  Fayrene Helper, MD   Chief Complaint: Rash over arm  History of Present Illness:    Morgann A Harvie is a 60 y.o. female who has a televisit for rash over right arm and forearm. She has had pictures through MyChart, which showed erythematous patches over antecubital fossa and over forearm.  She denies knowing any insect bite, but reports that it started as having 2 bumps around the elbow area.  Rash appeared after she tried to set the Christmas decorations.  She denies any fever, chills, dyspnea or wheezing currently.  She has tried Benadryl and cortisone cream with mild relief.  The patient does not have symptoms concerning for COVID-19 infection (fever, chills, cough, or new shortness of breath).   Past Medical, Surgical, Social History, Allergies, and Medications have been  Reviewed.  Past Medical History:  Diagnosis Date   Depression 06/2009   Hearing loss    Lichen sclerosus et atrophicus 07/26/2015   Perimenopausal    Postmenopausal HRT (hormone replacement therapy) 08/29/2015   Seasonal allergies    Situational stress    with Mom's deterioating health . Off celexa for at least 2 months    Vaginal atrophy 07/26/2015   Past Surgical History:  Procedure Laterality Date   COLONOSCOPY N/A 05/24/2013   Procedure: COLONOSCOPY;  Surgeon: Rogene Houston, MD;  Location: [facility];  Service: Endoscopy;  Laterality: N/A;  930   IR ANGIO INTRA EXTRACRAN SEL COM CAROTID INNOMINATE BILAT MOD SED  05/30/2020   IR ANGIO VERTEBRAL SEL SUBCLAVIAN INNOMINATE BILAT MOD SED  05/30/2020     Current Meds  Medication Sig   acetaminophen (TYLENOL) 500 MG tablet Take 500 mg by mouth every 6 (six) hours as needed for mild pain.   Calcium Citrate-Vitamin D (CALCIUM + D PO) Take by mouth in the morning and at bedtime. Calcium 500 mg and Vitamin D 2000 units   ergocalciferol (VITAMIN D2) 1.25 MG (50000 UT) capsule Take 1 capsule (50,000 Units total) by mouth once a week. One capsule once weekly   melatonin 5 MG TABS Take 5 mg by mouth at bedtime as needed (For sleep).     Allergies:   Patient has no known allergies.   ROS:   Please see the history of present illness.     All other systems reviewed and are negative.   Labs/Other Tests and Data Reviewed:    Recent Labs: 10/31/2020: ALT  11; BUN 11; Creatinine, Ser 0.58; Hemoglobin 14.2; Platelets 259; Potassium 4.2; Sodium 142; TSH 0.972   Recent Lipid Panel Lab Results  Component Value Date/Time   CHOL 217 (H) 02/27/2020 08:55 AM   TRIG 48 02/27/2020 08:55 AM   HDL 87 02/27/2020 08:55 AM   CHOLHDL 2.5 02/27/2020 08:55 AM   LDLCALC 115 (H) 02/27/2020 08:55 AM    Wt Readings from Last 3 Encounters:  10/31/20 138 lb (62.6 kg)  10/14/20 135 lb (61.2 kg)  06/20/20 134 lb (60.8 kg)     ASSESSMENT & PLAN:     Allergic reaction Possible insect bite Unclear etiology of rash currently, could be an allergic reaction versus insect bite Started empiric doxycycline Continue Benadryl and as needed cortisone cream Medrol Dosepak for resistant symptoms  Time:   Today, I have spent 9 minutes reviewing the chart, including problem list, medications, and with the patient with telehealth technology discussing the above problems.   Medication Adjustments/Labs and Tests Ordered: Current medicines are reviewed at length with the patient today.  Concerns regarding medicines are outlined above.   Tests Ordered: No orders of the defined types were placed in this encounter.   Medication Changes: No orders of the defined types were placed in this encounter.    Note: This dictation was prepared with Dragon dictation along with smaller phrase technology. Similar sounding words can be transcribed inadequately or may not be corrected upon review. Any transcriptional errors that result from this process are unintentional.      Disposition:  Follow up  Signed, Lindell Spar, MD  06/11/2021 2:29 PM     [place]

## 2021-06-12 ENCOUNTER — Other Ambulatory Visit (HOSPITAL_COMMUNITY): Payer: Self-pay | Admitting: Family Medicine

## 2021-06-12 DIAGNOSIS — Z1231 Encounter for screening mammogram for malignant neoplasm of breast: Secondary | ICD-10-CM

## 2021-06-16 ENCOUNTER — Other Ambulatory Visit: Payer: Self-pay | Admitting: Internal Medicine

## 2021-06-19 ENCOUNTER — Ambulatory Visit (HOSPITAL_COMMUNITY)
Admission: RE | Admit: 2021-06-19 | Discharge: 2021-06-19 | Disposition: A | Discharge status: Home / Self Care | Payer: 59 | Source: Ambulatory Visit | Attending: Family Medicine | Admitting: Family Medicine

## 2021-06-19 ENCOUNTER — Other Ambulatory Visit: Payer: Self-pay

## 2021-06-19 DIAGNOSIS — Z1231 Encounter for screening mammogram for malignant neoplasm of breast: Secondary | ICD-10-CM | POA: Diagnosis not present

## 2021-06-19 IMAGING — MG MM DIGITAL SCREENING BILAT W/ TOMO AND CAD
8 series · 9 of 24 positions shown · non-contrast
Comparison: Previous exam(s).

CLINICAL DATA: Screening.

EXAM:
DIGITAL SCREENING BILATERAL MAMMOGRAM WITH TOMOSYNTHESIS AND CAD
TECHNIQUE: Bilateral screening digital craniocaudal and mediolateral oblique
mammograms were obtained. Bilateral screening digital breast
tomosynthesis was performed. The images were evaluated with
computer-aided detection.

[L CC synth-2D]
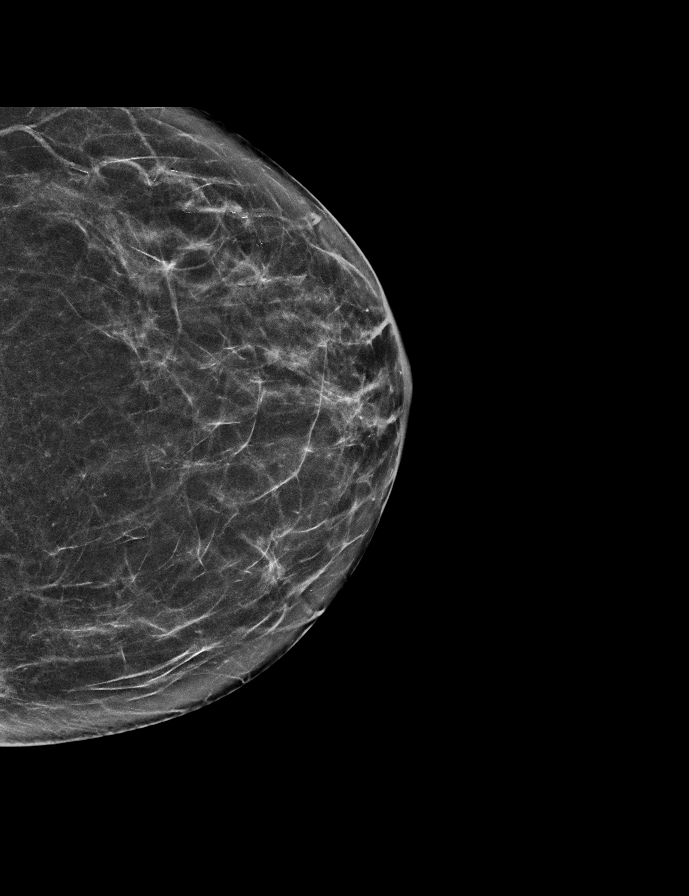

[L MLO synth-2D]
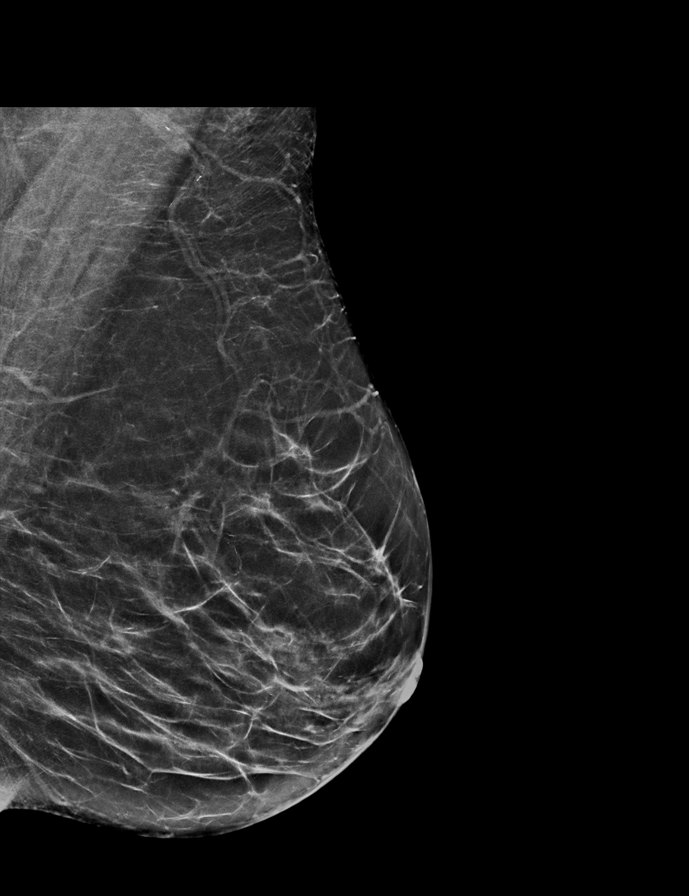

[R CC synth-2D]
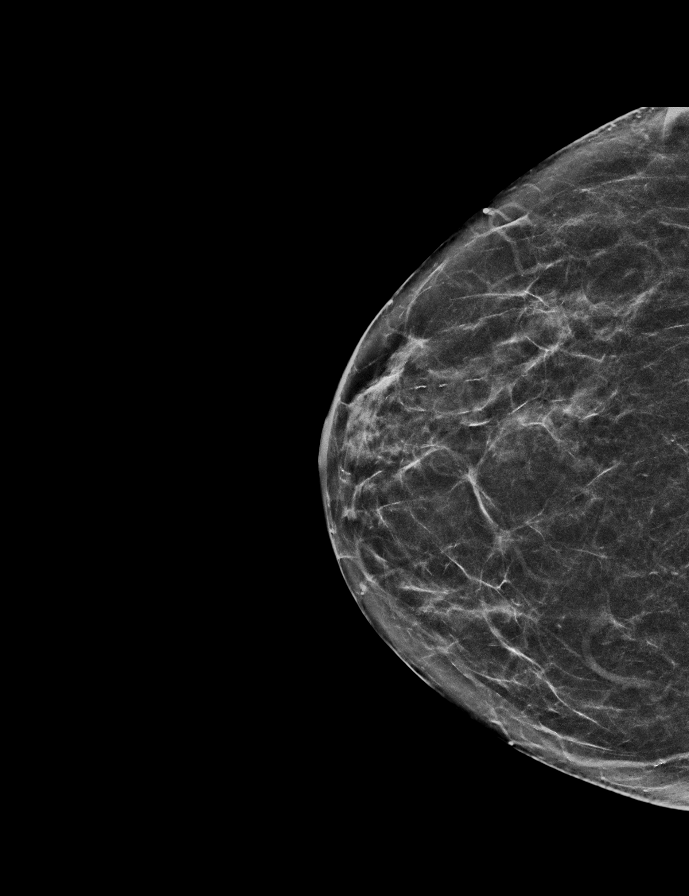

[R MLO synth-2D]
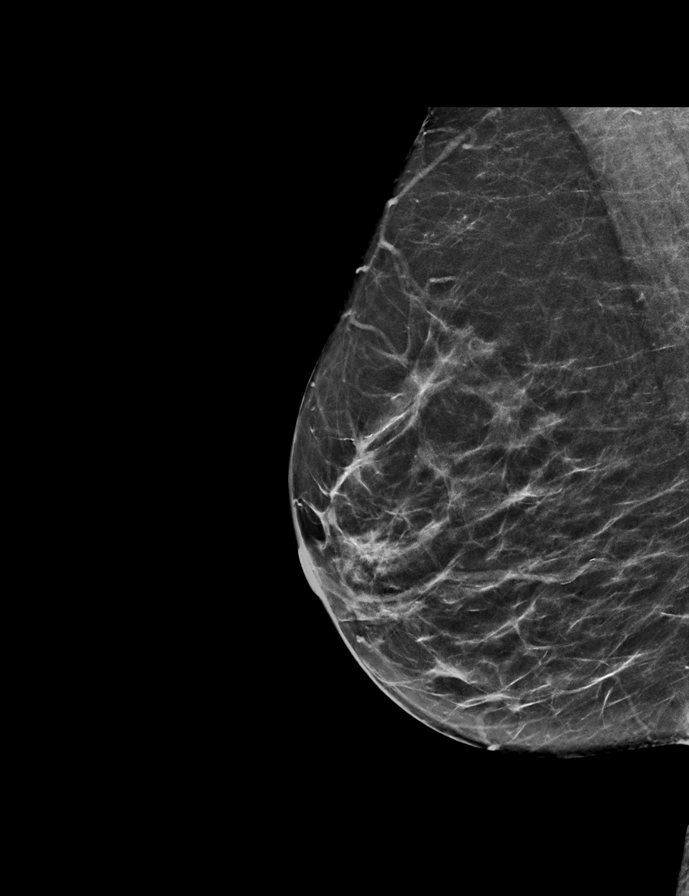

[L MLO tomo · 2 of 63 frames shown]
[frame 21/63]
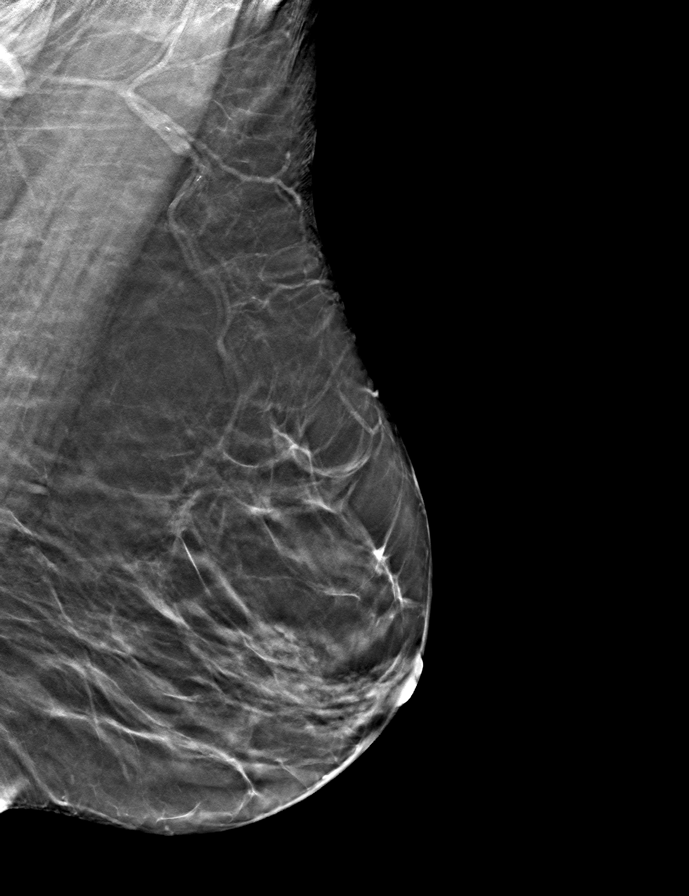
[frame 32/63]
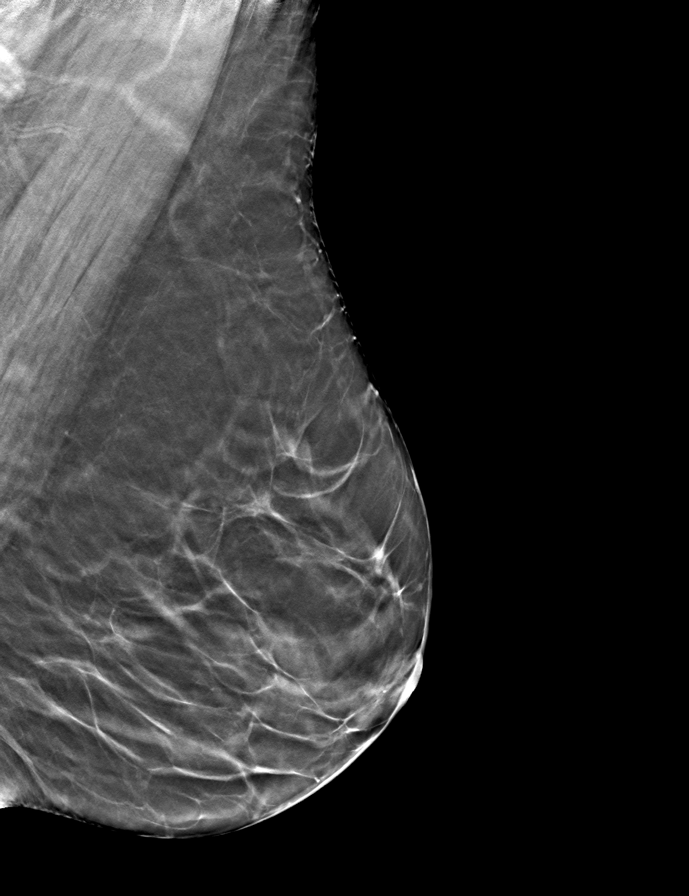

[R MLO tomo · tomo slice 29/56.0]
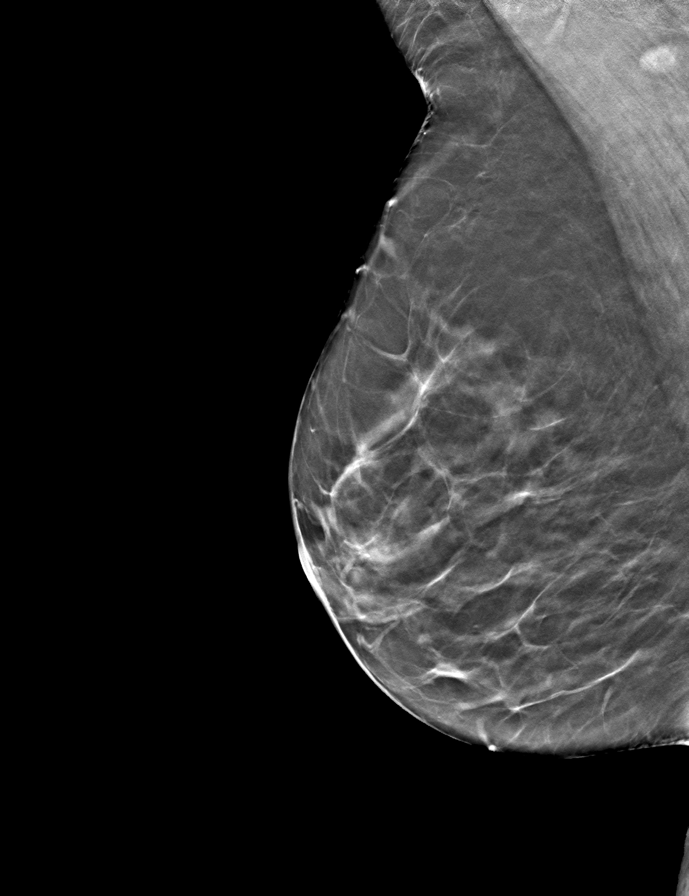

[L CC tomo · tomo slice 29/58.0]
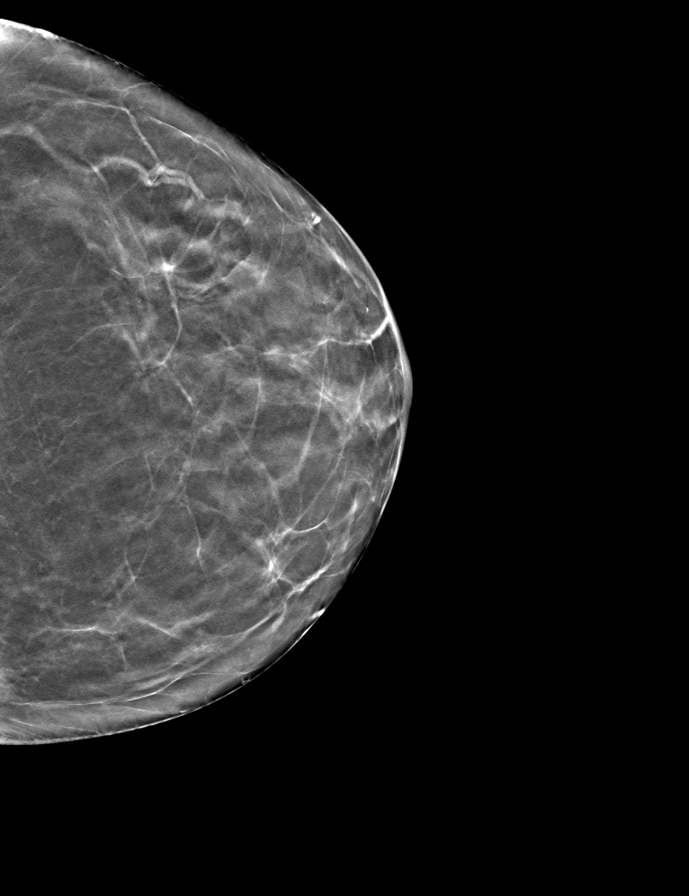

[R CC tomo · tomo slice 28/55.0]
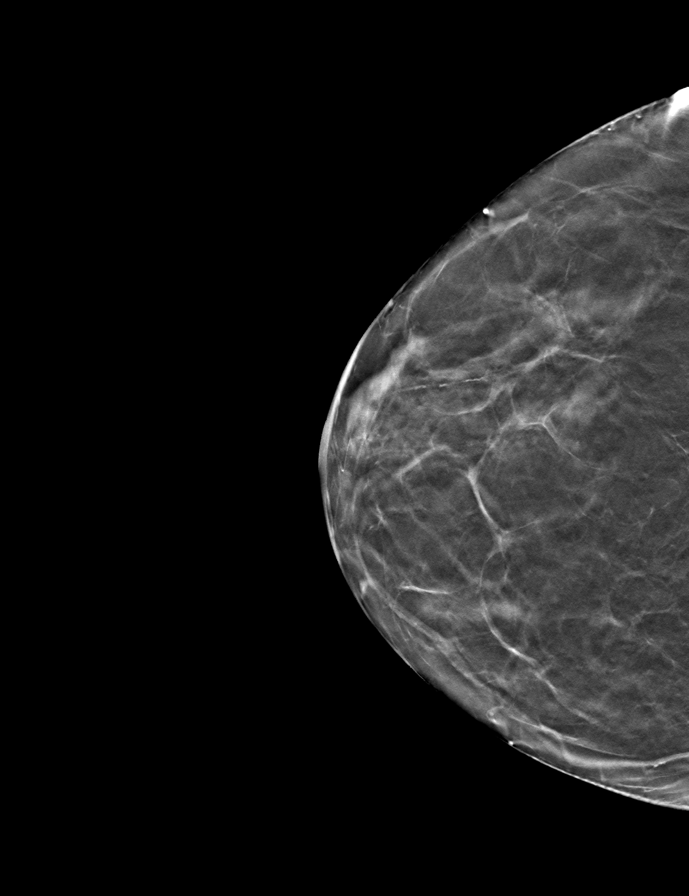

[9 of 24 positions shown; findings below may reference images not displayed]

ACR Breast Density Category b: There are scattered areas of
fibroglandular density.
FINDINGS: There are no findings suspicious for malignancy.
IMPRESSION: No mammographic evidence of malignancy. A result letter of this
screening mammogram will be mailed directly to the patient.

RECOMMENDATION:
Screening mammogram in one year. (Code:[BY])

BI-RADS CATEGORY  1: Negative.

## 2021-06-23 ENCOUNTER — Other Ambulatory Visit: Payer: Self-pay

## 2021-06-23 DIAGNOSIS — E559 Vitamin D deficiency, unspecified: Secondary | ICD-10-CM

## 2021-06-23 DIAGNOSIS — R7302 Impaired glucose tolerance (oral): Secondary | ICD-10-CM

## 2021-06-23 DIAGNOSIS — E785 Hyperlipidemia, unspecified: Secondary | ICD-10-CM

## 2021-06-23 DIAGNOSIS — R030 Elevated blood-pressure reading, without diagnosis of hypertension: Secondary | ICD-10-CM

## 2021-06-26 ENCOUNTER — Ambulatory Visit (INDEPENDENT_AMBULATORY_CARE_PROVIDER_SITE_OTHER): Payer: 59

## 2021-06-26 ENCOUNTER — Other Ambulatory Visit: Payer: Self-pay

## 2021-06-26 DIAGNOSIS — Z23 Encounter for immunization: Secondary | ICD-10-CM

## 2021-07-07 ENCOUNTER — Encounter: Payer: Self-pay | Admitting: Family Medicine

## 2021-08-29 LAB — CBC
Hematocrit: 34.7 % (ref 34.0–46.6)
Hemoglobin: 11.8 g/dL (ref 11.1–15.9)
MCH: 31.6 pg (ref 26.6–33.0)
MCHC: 34 g/dL (ref 31.5–35.7)
MCV: 93 fL (ref 79–97)
Platelets: 311 10*3/uL (ref 150–450)
RBC: 3.73 x10E6/uL — ABNORMAL LOW (ref 3.77–5.28)
RDW: 12.7 % (ref 11.7–15.4)
WBC: 3.2 10*3/uL — ABNORMAL LOW (ref 3.4–10.8)

## 2021-08-29 LAB — CMP14+EGFR
ALT: 10 IU/L (ref 0–32)
AST: 14 IU/L (ref 0–40)
Albumin/Globulin Ratio: 2.3 — ABNORMAL HIGH (ref 1.2–2.2)
Albumin: 4.5 g/dL (ref 3.8–4.9)
Alkaline Phosphatase: 54 IU/L (ref 44–121)
BUN/Creatinine Ratio: 23 (ref 9–23)
BUN: 13 mg/dL (ref 6–24)
Bilirubin Total: 0.3 mg/dL (ref 0.0–1.2)
CO2: 24 mmol/L (ref 20–29)
Calcium: 9.3 mg/dL (ref 8.7–10.2)
Chloride: 107 mmol/L — ABNORMAL HIGH (ref 96–106)
Creatinine, Ser: 0.57 mg/dL (ref 0.57–1.00)
Globulin, Total: 2 g/dL (ref 1.5–4.5)
Glucose: 80 mg/dL (ref 70–99)
Potassium: 4 mmol/L (ref 3.5–5.2)
Sodium: 143 mmol/L (ref 134–144)
Total Protein: 6.5 g/dL (ref 6.0–8.5)
eGFR: 105 mL/min/{1.73_m2} (ref >59)

## 2021-08-29 LAB — LIPID PANEL WITH LDL/HDL RATIO
Cholesterol, Total: 199 mg/dL (ref 100–199)
HDL: 79 mg/dL (ref >39)
LDL Chol Calc (NIH): 112 mg/dL — ABNORMAL HIGH (ref 0–99)
LDL/HDL Ratio: 1.4 ratio (ref 0.0–3.2)
Triglycerides: 44 mg/dL (ref 0–149)
VLDL Cholesterol Cal: 8 mg/dL (ref 5–40)

## 2021-08-29 LAB — HEMOGLOBIN A1C
Est. average glucose Bld gHb Est-mCnc: 103 mg/dL
Hgb A1c MFr Bld: 5.2 % (ref 4.8–5.6)

## 2021-08-29 LAB — VITAMIN D 25 HYDROXY (VIT D DEFICIENCY, FRACTURES): Vit D, 25-Hydroxy: 21.9 ng/mL — ABNORMAL LOW (ref 30.0–100.0)

## 2021-08-29 LAB — TSH: TSH: 1.09 u[IU]/mL (ref 0.450–4.500)

## 2021-09-02 ENCOUNTER — Ambulatory Visit (INDEPENDENT_AMBULATORY_CARE_PROVIDER_SITE_OTHER): Payer: No Typology Code available for payment source | Admitting: Family Medicine

## 2021-09-02 ENCOUNTER — Other Ambulatory Visit: Payer: Self-pay

## 2021-09-02 ENCOUNTER — Encounter: Payer: Self-pay | Admitting: Family Medicine

## 2021-09-02 ENCOUNTER — Other Ambulatory Visit (HOSPITAL_COMMUNITY): Payer: Self-pay

## 2021-09-02 VITALS — BP 118/70 | HR 84 | Ht 62.0 in | Wt 136.0 lb

## 2021-09-02 DIAGNOSIS — Z23 Encounter for immunization: Secondary | ICD-10-CM

## 2021-09-02 DIAGNOSIS — E559 Vitamin D deficiency, unspecified: Secondary | ICD-10-CM

## 2021-09-02 DIAGNOSIS — Z0000 Encounter for general adult medical examination without abnormal findings: Secondary | ICD-10-CM

## 2021-09-02 DIAGNOSIS — M8580 Other specified disorders of bone density and structure, unspecified site: Secondary | ICD-10-CM | POA: Diagnosis not present

## 2021-09-02 DIAGNOSIS — R32 Unspecified urinary incontinence: Secondary | ICD-10-CM

## 2021-09-02 MED ORDER — ERGOCALCIFEROL 1.25 MG (50000 UT) PO CAPS
50000.0000 [IU] | ORAL_CAPSULE | ORAL | 1 refills | Status: DC
  Filled 2021-09-02: qty 12, 84d supply, fill #0
  Filled 2021-12-11: qty 12, 84d supply, fill #1

## 2021-09-02 MED ORDER — MIRABEGRON ER 25 MG PO TB24
25.0000 mg | ORAL_TABLET | Freq: Every day | ORAL | 1 refills | Status: DC
  Filled 2021-09-02: qty 30, 30d supply, fill #0
  Filled 2021-10-05: qty 30, 30d supply, fill #1
  Filled 2021-11-13: qty 30, 30d supply, fill #2

## 2021-09-02 NOTE — Patient Instructions (Addendum)
F/U in 6 month with pap, call if you need me sooner  TdAP today  Please commit to daily medication for overactive bladder, call back if no response you will be referred to Urology  Please commit to calcium 1200 mg daily and vit D3 1000 IU daily  Please commit to once weekly vit D which is prescribed  Please lower fried andfatty foods  Fasting lipid  and vit d 1 week before next visit  Please arrange fIT test for colon screening prior to checkout (colonoscopy due next year)  It is important that you exercise regularly at least 30 minutes 5 times a week. If you develop chest pain, have severe difficulty breathing, or feel very tired, stop exercising immediately and seek medical attention  Thanks for choosing [facility], we consider it a privelige to serve you.

## 2021-09-02 NOTE — Progress Notes (Signed)
Bet    Holly Murphy     MRN: [id number]      DOB: 1961/01/30  HPI: Patient is in for annual physical exam. Immunization is reviewed , and  is updated 4 year h/o progressive urinary urgency and frequency, up at least 4 to 5 times per niight, voids every 2 to 3 hrs [place], no issue with stream or emptying Not as active and committed to exercise as in the past but intends to change this, also committing to addressing osteopenia diagnosis with exercise and calcium with D supplementation   PE: BP 118/70 (BP Location: Left Arm, Patient Position: Sitting, Cuff Size: Normal)    Pulse 84    Ht 5\' 2"  (1.575 m)    Wt 136 lb (61.7 kg)    SpO2 100%    BMI 24.87 kg/m   Pleasant  female, alert and oriented x 3, in no cardio-pulmonary distress. Afebrile. HEENT No facial trauma or asymetry. Sinuses non tender.  Extra occullar muscles intact.. External ears normal, . Neck: supple, no adenopathy,JVD or thyromegaly.No bruits.  Chest: Clear to ascultation bilaterally.No crackles or wheezes. Non tender to palpation   Cardiovascular system; Heart sounds normal,  S1 and  S2 ,no S3.  No murmur, or thrill. Apical beat not displaced Peripheral pulses normal.  Abdomen: Soft, non tender, no organomegaly or masses. No bruits. Bowel sounds normal. No guarding, tenderness or rebound.    Musculoskeletal exam: Full ROM of spine, hips , shoulders and knees. No deformity ,swelling or crepitus noted. No muscle wasting or atrophy.   Neurologic: Cranial nerves 2 to 12 intact. Power, tone ,sensation and reflexes normal throughout. No disturbance in gait. No tremor.  Skin: Intact, no ulceration, erythema , scaling or rash noted. Pigmentation normal throughout  Psych; Normal mood and affect. Judgement and concentration normal   Assessment & Plan:  Annual physical exam Annual exam as documented. Counseling done  re healthy lifestyle involving commitment to 150 minutes exercise per week,  heart healthy diet, and attaining healthy weight.The importance of adequate sleep also discussed. Regular seat belt use and home safety, is also discussed. Changes in health habits are decided on by the patient with goals and time frames  set for achieving them. Immunization and cancer screening needs are specifically addressed at this visit.   Osteopenia Needs to commit to calcium and vit D daily as well as increase weight bearing exercise to minimum of 150 min/ week Will update dexa in next 1 to 2 years  Vitamin D deficiency Commit to  Both weekly and daily vit D  Urinary incontinence Worsening incontinence, trial of medication, if unsuccessful, pt to be in touch and she will be referre to Urology. Did see Urology approx 2 years ago about the problem , lost to f/u and problem is worsening

## 2021-09-05 ENCOUNTER — Other Ambulatory Visit (HOSPITAL_COMMUNITY): Payer: Self-pay

## 2021-09-07 ENCOUNTER — Encounter: Payer: Self-pay | Admitting: Family Medicine

## 2021-09-07 NOTE — Assessment & Plan Note (Signed)
Commit to  Both weekly and daily vit D

## 2021-09-07 NOTE — Assessment & Plan Note (Signed)
Worsening incontinence, trial of medication, if unsuccessful, pt to be in touch and she will be referre to Urology. Did see Urology approx 2 years ago about the problem , lost to f/u and problem is worsening

## 2021-09-07 NOTE — Assessment & Plan Note (Addendum)
Needs to commit to calcium and vit D daily as well as increase weight bearing exercise to minimum of 150 min/ week Will update dexa in next 1 to 2 years

## 2021-09-07 NOTE — Assessment & Plan Note (Signed)

## 2021-10-06 ENCOUNTER — Other Ambulatory Visit (HOSPITAL_COMMUNITY): Payer: Self-pay

## 2021-10-13 ENCOUNTER — Other Ambulatory Visit (HOSPITAL_COMMUNITY): Payer: Self-pay

## 2021-11-14 ENCOUNTER — Other Ambulatory Visit (HOSPITAL_COMMUNITY): Payer: Self-pay

## 2021-12-11 ENCOUNTER — Other Ambulatory Visit (HOSPITAL_COMMUNITY): Payer: Self-pay

## 2022-01-09 ENCOUNTER — Encounter: Payer: Self-pay | Admitting: Family Medicine

## 2022-01-16 ENCOUNTER — Other Ambulatory Visit (HOSPITAL_COMMUNITY): Payer: Self-pay

## 2022-01-16 ENCOUNTER — Other Ambulatory Visit: Payer: Self-pay

## 2022-01-16 DIAGNOSIS — Z1211 Encounter for screening for malignant neoplasm of colon: Secondary | ICD-10-CM

## 2022-01-18 LAB — FECAL OCCULT BLOOD, IMMUNOCHEMICAL: Fecal Occult Bld: NEGATIVE

## 2022-02-20 ENCOUNTER — Other Ambulatory Visit: Payer: Self-pay

## 2022-02-20 DIAGNOSIS — E559 Vitamin D deficiency, unspecified: Secondary | ICD-10-CM

## 2022-02-20 DIAGNOSIS — E785 Hyperlipidemia, unspecified: Secondary | ICD-10-CM

## 2022-02-21 LAB — LIPID PANEL
Chol/HDL Ratio: 2.7 ratio (ref 0.0–4.4)
Cholesterol, Total: 193 mg/dL (ref 100–199)
HDL: 72 mg/dL (ref >39)
LDL Chol Calc (NIH): 111 mg/dL — ABNORMAL HIGH (ref 0–99)
Triglycerides: 52 mg/dL (ref 0–149)
VLDL Cholesterol Cal: 10 mg/dL (ref 5–40)

## 2022-02-21 LAB — VITAMIN D 25 HYDROXY (VIT D DEFICIENCY, FRACTURES): Vit D, 25-Hydroxy: 48.5 ng/mL (ref 30.0–100.0)

## 2022-03-03 ENCOUNTER — Other Ambulatory Visit (HOSPITAL_COMMUNITY)
Admission: RE | Admit: 2022-03-03 | Discharge: 2022-03-03 | Disposition: A | Discharge status: Home / Self Care | Payer: No Typology Code available for payment source | Source: Ambulatory Visit | Attending: Family Medicine | Admitting: Family Medicine

## 2022-03-03 ENCOUNTER — Other Ambulatory Visit (HOSPITAL_COMMUNITY): Payer: Self-pay

## 2022-03-03 ENCOUNTER — Encounter: Payer: Self-pay | Admitting: Family Medicine

## 2022-03-03 ENCOUNTER — Ambulatory Visit (INDEPENDENT_AMBULATORY_CARE_PROVIDER_SITE_OTHER): Payer: No Typology Code available for payment source | Admitting: Family Medicine

## 2022-03-03 VITALS — BP 113/67 | HR 77 | Ht 62.0 in | Wt 131.0 lb

## 2022-03-03 DIAGNOSIS — Z01419 Encounter for gynecological examination (general) (routine) without abnormal findings: Secondary | ICD-10-CM | POA: Diagnosis not present

## 2022-03-03 DIAGNOSIS — Z1231 Encounter for screening mammogram for malignant neoplasm of breast: Secondary | ICD-10-CM | POA: Diagnosis not present

## 2022-03-03 DIAGNOSIS — E559 Vitamin D deficiency, unspecified: Secondary | ICD-10-CM

## 2022-03-03 DIAGNOSIS — E785 Hyperlipidemia, unspecified: Secondary | ICD-10-CM

## 2022-03-03 DIAGNOSIS — Z124 Encounter for screening for malignant neoplasm of cervix: Secondary | ICD-10-CM

## 2022-03-03 DIAGNOSIS — Z1211 Encounter for screening for malignant neoplasm of colon: Secondary | ICD-10-CM | POA: Insufficient documentation

## 2022-03-03 MED ORDER — ERGOCALCIFEROL 1.25 MG (50000 UT) PO CAPS
1.0000 | ORAL_CAPSULE | ORAL | 3 refills | Status: DC
Start: 2022-03-03 — End: 2023-06-03
  Filled 2022-03-03: qty 6, 84d supply, fill #0
  Filled 2022-05-24: qty 6, 84d supply, fill #1
  Filled 2022-08-13 – 2022-08-21 (×2): qty 6, 84d supply, fill #0
  Filled 2023-01-21: qty 6, 84d supply, fill #1

## 2022-03-03 NOTE — Assessment & Plan Note (Signed)
Normal pelvicexam and pap sent

## 2022-03-03 NOTE — Progress Notes (Signed)
    Holly Murphy     MRN: [id number]      DOB: Aug 16, 1960  HPI: Patient is in for follow up with pap and lab review Still has significant urgeincontinence and considering going into a study. No benefit from medication PE: BP 113/67 (BP Location: Left Arm, Patient Position: Sitting, Cuff Size: Normal)   Pulse 77   Ht '5\' 2"'$  (1.575 m)   Wt 131 lb (59.4 kg)   SpO2 98%   BMI 23.96 kg/m   Pleasant  female, alert and oriented x 3, in no cardio-pulmonary distress. Afebrile. HEENT No facial trauma or asymetry. Sinuses non tender.  Extra occullar muscles intact.. External ears normal, . Neck: supple, no adenopathy,JVD or thyromegaly.No bruits.  Chest: Clear to ascultation bilaterally.No crackles or wheezes. Non tender to palpation  Breast: No asymetry,no masses or lumps. No tenderness. No nipple discharge or inversion. No axillary or supraclavicular adenopathy  Cardiovascular system; Heart sounds normal,  S1 and  S2 ,no S3.  No murmur, or thrill. Apical beat not displaced Peripheral pulses normal.  Abdomen: Soft, non tender, n GU: External genitalia normal female genitalia , normal female distribution of hair. No lesions. Urethral meatus normal in size, no  Prolapse, no lesions visibly  Present. Bladder non tender. Vagina pink and moist , with no visible lesions , discharge present . Adequate pelvic support no  cystocele or rectocele noted Cervix pink and appears healthy, no lesions or ulcerations noted, no discharge noted from os Uterus normal size, no adnexal masses, no cervical motion or adnexal tenderness.   Musculoskeletal exam: Full ROM of spine, hips , shoulders and knees. No deformity ,swelling or crepitus noted. No muscle wasting or atrophy.   Neurologic: Cranial nerves 2 to 12 intact. Power, tone ,sensation and reflexes normal throughout. No disturbance in gait. No tremor.  Skin: Intact, no ulceration, erythema , scaling or rash noted. Pigmentation  normal throughout  Psych; Normal mood and affect. Judgement and concentration normal   Assessment & Plan:  Dyslipidemia, goal LDL below 100 Hyperlipidemia:Low fat diet discussed and encouraged.   Lipid Panel  Lab Results  Component Value Date   CHOL 193 02/20/2022   HDL 72 02/20/2022   LDLCALC 111 (H) 02/20/2022   TRIG 52 02/20/2022   CHOLHDL 2.7 02/20/2022     Unchanged, dietary modification neded  Vitamin D deficiency Corrected reduce to twice monthly supplement  Periodic health assessment, Pap and pelvic Normal pelvicexam and pap sent

## 2022-03-03 NOTE — Patient Instructions (Signed)
F/U in 6 months, call if yoou ned me sooner  Continue to reduce fat in diet   Fasting lipid an d vit d in 6 months  It is important that you exercise regularly at least 30 minutes 5 times a week. If you develop chest pain, have severe difficulty breathing, or feel very tired, stop exercising immediately and seek medical attention   Congrats on 5 pound weight loss   Please schedule December mammogram at checkpout  Reduce vit d to once every 2 weeks  Thanks for choosing [facility], we consider it a privelige to serve you.

## 2022-03-03 NOTE — Assessment & Plan Note (Signed)
Corrected reduce to twice monthly supplement

## 2022-03-03 NOTE — Assessment & Plan Note (Signed)
Hyperlipidemia:Low fat diet discussed and encouraged.   Lipid Panel  Lab Results  Component Value Date   CHOL 193 02/20/2022   HDL 72 02/20/2022   LDLCALC 111 (H) 02/20/2022   TRIG 52 02/20/2022   CHOLHDL 2.7 02/20/2022     Unchanged, dietary modification neded

## 2022-03-06 LAB — CYTOLOGY - PAP
Comment: NEGATIVE
Diagnosis: NEGATIVE
High risk HPV: NEGATIVE

## 2022-05-25 ENCOUNTER — Other Ambulatory Visit (HOSPITAL_COMMUNITY): Payer: Self-pay

## 2022-07-09 ENCOUNTER — Ambulatory Visit (HOSPITAL_COMMUNITY)
Admission: RE | Admit: 2022-07-09 | Discharge: 2022-07-09 | Disposition: A | Discharge status: Home / Self Care | Payer: No Typology Code available for payment source | Source: Ambulatory Visit | Attending: Family Medicine | Admitting: Family Medicine

## 2022-07-09 DIAGNOSIS — Z1231 Encounter for screening mammogram for malignant neoplasm of breast: Secondary | ICD-10-CM | POA: Insufficient documentation

## 2022-07-28 DIAGNOSIS — Z1283 Encounter for screening for malignant neoplasm of skin: Secondary | ICD-10-CM | POA: Diagnosis not present

## 2022-07-28 DIAGNOSIS — I820 Budd-Chiari syndrome: Secondary | ICD-10-CM | POA: Diagnosis not present

## 2022-07-28 DIAGNOSIS — D225 Melanocytic nevi of trunk: Secondary | ICD-10-CM | POA: Diagnosis not present

## 2022-08-13 ENCOUNTER — Other Ambulatory Visit (HOSPITAL_COMMUNITY): Payer: Self-pay

## 2022-08-17 ENCOUNTER — Other Ambulatory Visit (HOSPITAL_COMMUNITY): Payer: Self-pay

## 2022-08-21 ENCOUNTER — Other Ambulatory Visit (HOSPITAL_COMMUNITY): Payer: Self-pay

## 2022-08-31 ENCOUNTER — Other Ambulatory Visit (HOSPITAL_COMMUNITY): Payer: Self-pay

## 2022-08-31 MED ORDER — OMRON 3 SERIES BP MONITOR DEVI
0 refills | Status: DC
  Filled 2022-08-31: qty 1, 1d supply, fill #0

## 2022-09-10 ENCOUNTER — Ambulatory Visit: Payer: No Typology Code available for payment source | Admitting: Family Medicine

## 2022-10-07 ENCOUNTER — Other Ambulatory Visit: Payer: Self-pay | Admitting: Family Medicine

## 2022-10-07 DIAGNOSIS — E559 Vitamin D deficiency, unspecified: Secondary | ICD-10-CM

## 2022-10-07 DIAGNOSIS — E785 Hyperlipidemia, unspecified: Secondary | ICD-10-CM

## 2022-10-08 DIAGNOSIS — E559 Vitamin D deficiency, unspecified: Secondary | ICD-10-CM | POA: Diagnosis not present

## 2022-10-08 DIAGNOSIS — E785 Hyperlipidemia, unspecified: Secondary | ICD-10-CM | POA: Diagnosis not present

## 2022-10-09 LAB — LIPID PANEL
Chol/HDL Ratio: 2.5 ratio (ref 0.0–4.4)
Cholesterol, Total: 201 mg/dL — ABNORMAL HIGH (ref 100–199)
HDL: 80 mg/dL (ref >39)
LDL Chol Calc (NIH): 112 mg/dL — ABNORMAL HIGH (ref 0–99)
Triglycerides: 48 mg/dL (ref 0–149)
VLDL Cholesterol Cal: 9 mg/dL (ref 5–40)

## 2022-10-09 LAB — VITAMIN D 25 HYDROXY (VIT D DEFICIENCY, FRACTURES): Vit D, 25-Hydroxy: 44.7 ng/mL (ref 30.0–100.0)

## 2022-10-15 ENCOUNTER — Ambulatory Visit (INDEPENDENT_AMBULATORY_CARE_PROVIDER_SITE_OTHER): Payer: 59 | Admitting: Family Medicine

## 2022-10-15 ENCOUNTER — Encounter: Payer: Self-pay | Admitting: Family Medicine

## 2022-10-15 VITALS — BP 115/75 | HR 72 | Ht 62.0 in | Wt 138.1 lb

## 2022-10-15 DIAGNOSIS — G8929 Other chronic pain: Secondary | ICD-10-CM | POA: Diagnosis not present

## 2022-10-15 DIAGNOSIS — Z1211 Encounter for screening for malignant neoplasm of colon: Secondary | ICD-10-CM | POA: Diagnosis not present

## 2022-10-15 DIAGNOSIS — Z0000 Encounter for general adult medical examination without abnormal findings: Secondary | ICD-10-CM

## 2022-10-15 DIAGNOSIS — M25511 Pain in right shoulder: Secondary | ICD-10-CM

## 2022-10-15 NOTE — Patient Instructions (Addendum)
F/U in Nov call if you need me sooner  You are referred to [place] for colonoscopy   You Are referred to Dr Amedeo Kinsman re right shoulder pain  CBC, TSH , vit D, cmp and EGFR fasting lipid panel 3 to 5 days before next visit  It is important that you exercise regularly at least 30 minutes 5 times a week. If you develop chest pain, have severe difficulty breathing, or feel very tired, stop exercising immediately and seek medical attention   Thanks for choosing  Primary Care, we consider it a privelige to serve you.

## 2022-10-15 NOTE — Progress Notes (Addendum)
    Holly Murphy     MRN: [id number]      DOB: 1960/12/02  HPI: Patient is in for annual physical exam. No other health concerns are expressed or addressed at the visit. Recent labs,  are reviewed. Immunization is reviewed , and  updated if needed.   PE: BP 115/75 (BP Location: Right Arm, Patient Position: Sitting, Cuff Size: Normal)   Pulse 72   Ht 5\' 2"  (1.575 m)   Wt 138 lb 1.3 oz (62.6 kg)   SpO2 98%   BMI 25.26 kg/m   Pleasant  female, alert and oriented x 3, in no cardio-pulmonary distress. Afebrile. HEENT No facial trauma or asymetry. Sinuses non tender.  Extra occullar muscles intact.. External ears normal, . Neck: supple, no adenopathy,JVD or thyromegaly.No bruits.  Chest: Clear to ascultation bilaterally.No crackles or wheezes. Non tender to palpation  Cardiovascular system; Heart sounds normal,  S1 and  S2 ,no S3.  No murmur, or thrill. Apical beat not displaced Peripheral pulses normal.  Abdomen: Soft, non tender,  Musculoskeletal exam: Full ROM of spine, hips ,  and knees.Decreased ROM right shoulder  No deformity ,swelling or crepitus noted. No muscle wasting or atrophy.   Neurologic: Cranial nerves 2 to 12 intact. Power, t normal throughout. No disturbance in gait. No tremor.  Skin: Intact, no ulceration, erythema , scaling or rash noted. Pigmentation normal throughout  Psych; Normal mood and affect. Judgement and concentration normal   Assessment & Plan:  Chronic right shoulder pain Increasing in frequency and severity ,several motions are luimited, Ortho eval  Annual physical exam Annual exam as documented. Counseling done  re healthy lifestyle involving commitment to 150 minutes exercise per week, heart healthy diet, and attaining healthy weight.The importance of adequate sleep also discussed. Regular seat belt use and home safety, is also discussed. Changes in health habits are decided on by the patient with goals and time frames   set for achieving them. Immunization and cancer screening needs are specifically addressed at this visit.

## 2022-10-15 NOTE — Assessment & Plan Note (Signed)
Increasing in frequency and severity ,several motions are luimited, Ortho eval

## 2022-10-15 NOTE — Assessment & Plan Note (Signed)

## 2022-10-19 ENCOUNTER — Encounter: Payer: Self-pay | Admitting: Family Medicine

## 2022-12-14 ENCOUNTER — Encounter: Payer: Self-pay | Admitting: Family Medicine

## 2022-12-17 ENCOUNTER — Other Ambulatory Visit: Payer: Self-pay

## 2022-12-17 DIAGNOSIS — G8929 Other chronic pain: Secondary | ICD-10-CM

## 2022-12-22 ENCOUNTER — Ambulatory Visit: Payer: 59 | Admitting: Orthopedic Surgery

## 2022-12-22 ENCOUNTER — Other Ambulatory Visit (INDEPENDENT_AMBULATORY_CARE_PROVIDER_SITE_OTHER): Payer: 59

## 2022-12-22 ENCOUNTER — Encounter: Payer: Self-pay | Admitting: Orthopedic Surgery

## 2022-12-22 VITALS — BP 132/70 | HR 65 | Ht 62.0 in | Wt 140.0 lb

## 2022-12-22 DIAGNOSIS — G8929 Other chronic pain: Secondary | ICD-10-CM | POA: Diagnosis not present

## 2022-12-22 DIAGNOSIS — M25511 Pain in right shoulder: Secondary | ICD-10-CM | POA: Diagnosis not present

## 2022-12-22 NOTE — Patient Instructions (Signed)

## 2022-12-22 NOTE — Progress Notes (Signed)
New Patient Visit  Assessment: Holly Murphy is a 62 y.o. female with the following: 1. Chronic right shoulder pain  Plan: Aelyn A Dulski will have periodic pain and tenderness in her right shoulder.  No specific injury.  This is been ongoing every couple months for the past 2 years.  On physical exam, she has excellent strength, with slightly restricted internal rotation only.  Pain is recreated with supraspinatus and biceps tendon testing.  Currently, she has minimal pain.  Continue with medications as needed, including Tylenol and naproxen.  Consider topical treatments such as sprays lotions or patches.  Provided her with a home exercise program for her right shoulder.  She can also consider programs available on YouTube.  If she has another flare, and wants to return to clinic for further consideration for an injection, she will contact the clinic.  Otherwise, follow-up as needed.  Follow-up: Return if symptoms worsen or fail to improve.  Subjective:  Chief Complaint  Patient presents with   Shoulder Pain    R shoulder pain for 2 yrs on and off     History of Present Illness: Holly Murphy is a 62 y.o. female who has been referred by Syliva Overman, MD for evaluation of right shoulder pain.  She is right-hand dominant.  She reports that she has had intermittent right shoulder pain for the past 2 years.  Every couple months, she notes some pain in the anterior shoulder.  Most recent onset was a couple of months ago.  She reports that her pain currently is tolerable.  At the time of most recent flare, she took naproxen for about a week, and this improved her symptoms.  She has not worked with physical therapy.  She has never had an injection.  She notes restricted motion when reaching behind her back.  Otherwise, she has no limitations.   Review of Systems: No fevers or chills No numbness or tingling No chest pain No shortness of breath No bowel or bladder dysfunction No  GI distress No headaches   Medical History:  Past Medical History:  Diagnosis Date   Depression 06/2009   Hearing loss    Lichen sclerosus et atrophicus 07/26/2015   Perimenopausal    Postmenopausal HRT (hormone replacement therapy) 08/29/2015   Seasonal allergies    Situational stress    with Mom's deterioating health . Off celexa for at least 2 months    Vaginal atrophy 07/26/2015    Past Surgical History:  Procedure Laterality Date   COLONOSCOPY N/A 05/24/2013   Procedure: COLONOSCOPY;  Surgeon: Malissa Hippo, MD;  [facility];  Service: Endoscopy;  Laterality: N/A;  930   IR ANGIO INTRA EXTRACRAN SEL COM CAROTID INNOMINATE BILAT MOD SED  05/30/2020   IR ANGIO VERTEBRAL SEL SUBCLAVIAN INNOMINATE BILAT MOD SED  05/30/2020    Family History  Problem Relation Age of Onset   Diabetes Mother    Heart attack Mother    Hypertension Father    Cancer Maternal Grandmother        uterine   Heart attack Maternal Grandmother    Heart attack Maternal Grandfather    Other Paternal Grandmother        brain tumor   Heart attack Paternal Grandfather    Cancer Other        family history    Diabetes Other        family history    Heart defect Other  family history    Arthritis Other        family history    Social History   Tobacco Use   Smoking status: Never   Smokeless tobacco: Never  Vaping Use   Vaping Use: Never used  Substance Use Topics   Alcohol use: No   Drug use: No    No Known Allergies  Current Meds  Medication Sig   Calcium Citrate-Vitamin D (CALCIUM + D PO) Take by mouth in the morning and at bedtime. Calcium 500 mg and Vitamin D 2000 units   ergocalciferol (VITAMIN D2) 1.25 MG (50000 UT) capsule Take 1 capsule by mouth every 14 days.   melatonin 5 MG TABS Take 5 mg by mouth at bedtime as needed (For sleep).    Objective: BP 132/70   Pulse 65   Ht 5\' 2"  (1.575 m)   Wt 140 lb (63.5 kg)   BMI 25.61 kg/m   Physical  Exam:  General: Alert and oriented. and No acute distress. Gait: Normal gait.  Shoulder without deformity.  No bruising.  No swelling.  She has full forward flexion.  Slightly restricted internal rotation and reaching to her back.  External rotation at her side to 45 degrees.  She has 5/5 strength in the supraspinatus, but notes some discomfort with this testing.  Positive O'Brien's.  Positive Jobe's.  Negative belly press.  5/5 infraspinatus testing.  IMAGING: I personally ordered and reviewed the following images  X-rays of the right shoulder obtained in clinic today.  No acute injuries noted.  Well-positioned glenohumeral joint.  Well-maintained glenohumeral joint space.  No evidence of proximal humeral migration.  Slightly narrowed AC joint, without osteophytes.  Impression: Negative right shoulder x-ray   New Medications:  No orders of the defined types were placed in this encounter.     Oliver Barre, MD  12/22/2022 10:14 AM

## 2023-01-21 ENCOUNTER — Other Ambulatory Visit: Payer: Self-pay

## 2023-02-12 ENCOUNTER — Other Ambulatory Visit (HOSPITAL_COMMUNITY): Payer: Self-pay

## 2023-03-04 DIAGNOSIS — S338XXA Sprain of other parts of lumbar spine and pelvis, initial encounter: Secondary | ICD-10-CM | POA: Diagnosis not present

## 2023-03-04 DIAGNOSIS — S233XXA Sprain of ligaments of thoracic spine, initial encounter: Secondary | ICD-10-CM | POA: Diagnosis not present

## 2023-03-04 DIAGNOSIS — S134XXA Sprain of ligaments of cervical spine, initial encounter: Secondary | ICD-10-CM | POA: Diagnosis not present

## 2023-03-09 DIAGNOSIS — S134XXA Sprain of ligaments of cervical spine, initial encounter: Secondary | ICD-10-CM | POA: Diagnosis not present

## 2023-03-09 DIAGNOSIS — S338XXA Sprain of other parts of lumbar spine and pelvis, initial encounter: Secondary | ICD-10-CM | POA: Diagnosis not present

## 2023-03-09 DIAGNOSIS — S233XXA Sprain of ligaments of thoracic spine, initial encounter: Secondary | ICD-10-CM | POA: Diagnosis not present

## 2023-03-09 DIAGNOSIS — S43401A Unspecified sprain of right shoulder joint, initial encounter: Secondary | ICD-10-CM | POA: Diagnosis not present

## 2023-03-16 DIAGNOSIS — S233XXA Sprain of ligaments of thoracic spine, initial encounter: Secondary | ICD-10-CM | POA: Diagnosis not present

## 2023-03-16 DIAGNOSIS — S338XXA Sprain of other parts of lumbar spine and pelvis, initial encounter: Secondary | ICD-10-CM | POA: Diagnosis not present

## 2023-03-16 DIAGNOSIS — S43401A Unspecified sprain of right shoulder joint, initial encounter: Secondary | ICD-10-CM | POA: Diagnosis not present

## 2023-03-16 DIAGNOSIS — S134XXA Sprain of ligaments of cervical spine, initial encounter: Secondary | ICD-10-CM | POA: Diagnosis not present

## 2023-03-25 DIAGNOSIS — S233XXA Sprain of ligaments of thoracic spine, initial encounter: Secondary | ICD-10-CM | POA: Diagnosis not present

## 2023-03-25 DIAGNOSIS — S338XXA Sprain of other parts of lumbar spine and pelvis, initial encounter: Secondary | ICD-10-CM | POA: Diagnosis not present

## 2023-03-25 DIAGNOSIS — S134XXA Sprain of ligaments of cervical spine, initial encounter: Secondary | ICD-10-CM | POA: Diagnosis not present

## 2023-03-25 DIAGNOSIS — S43401A Unspecified sprain of right shoulder joint, initial encounter: Secondary | ICD-10-CM | POA: Diagnosis not present

## 2023-04-26 ENCOUNTER — Encounter: Payer: Self-pay | Admitting: Family Medicine

## 2023-04-26 DIAGNOSIS — Z1211 Encounter for screening for malignant neoplasm of colon: Secondary | ICD-10-CM

## 2023-04-28 ENCOUNTER — Encounter (INDEPENDENT_AMBULATORY_CARE_PROVIDER_SITE_OTHER): Payer: Self-pay | Admitting: *Deleted

## 2023-04-28 ENCOUNTER — Encounter: Payer: Self-pay | Admitting: Internal Medicine

## 2023-05-20 ENCOUNTER — Telehealth (INDEPENDENT_AMBULATORY_CARE_PROVIDER_SITE_OTHER): Payer: Self-pay | Admitting: *Deleted

## 2023-05-20 NOTE — Telephone Encounter (Signed)
Who is your primary care physician: Dr. Lodema Hong  Reasons for the colonoscopy: 10 yr recall  Have you had a colonoscopy before?  2014  Do you have family history of colon cancer? no  Previous colonoscopy with polyps removed? no  Do you have a history colorectal cancer?   no  Are you diabetic? If yes, Type 1 or Type 2?    no  Do you have a prosthetic or mechanical heart valve? no  Do you have a pacemaker/defibrillator?   no  Have you had endocarditis/atrial fibrillation? no  Have you had joint replacement within the last 12 months?  no  Do you tend to be constipated or have to use laxatives? no  Do you have any history of drugs or alchohol?  no  Do you use supplemental oxygen?  no  Have you had a stroke or heart attack within the last 6 months? no  Do you take weight loss medication?  no  For female patients: have you had a hysterectomy?  no                                     are you post menopausal?       yes                                            do you still have your menstrual cycle? no      Do you take any blood-thinning medications such as: (aspirin, warfarin, Plavix, Aggrenox)  no  If yes we need the name, milligram, dosage and who is prescribing doctor  Current Outpatient Medications on File Prior to Visit  Medication Sig Dispense Refill   acetaminophen (TYLENOL) 500 MG tablet Take 500 mg by mouth every 6 (six) hours as needed for mild pain.     ergocalciferol (VITAMIN D2) 1.25 MG (50000 UT) capsule Take 1 capsule by mouth every 14 days. 6 capsule 3   melatonin 5 MG TABS Take 5 mg by mouth at bedtime as needed (For sleep).     No current facility-administered medications on file prior to visit.    No Known Allergies   Pharmacy: [facility] outpatient  Primary Insurance Name: [employer] number where you can be reached: [phone]

## 2023-05-24 ENCOUNTER — Other Ambulatory Visit: Payer: Self-pay

## 2023-05-24 DIAGNOSIS — R030 Elevated blood-pressure reading, without diagnosis of hypertension: Secondary | ICD-10-CM

## 2023-05-24 DIAGNOSIS — E785 Hyperlipidemia, unspecified: Secondary | ICD-10-CM

## 2023-05-24 DIAGNOSIS — E559 Vitamin D deficiency, unspecified: Secondary | ICD-10-CM

## 2023-05-25 ENCOUNTER — Other Ambulatory Visit: Payer: Self-pay

## 2023-05-25 ENCOUNTER — Other Ambulatory Visit (HOSPITAL_COMMUNITY): Payer: Self-pay

## 2023-05-25 LAB — CMP14+EGFR
ALT: 14 [IU]/L (ref 0–32)
AST: 20 [IU]/L (ref 0–40)
Albumin: 3.9 g/dL (ref 3.8–4.9)
Alkaline Phosphatase: 57 [IU]/L (ref 44–121)
BUN/Creatinine Ratio: 20 (ref 12–28)
BUN: 12 mg/dL (ref 8–27)
Bilirubin Total: 0.4 mg/dL (ref 0.0–1.2)
CO2: 24 mmol/L (ref 20–29)
Calcium: 9.3 mg/dL (ref 8.7–10.3)
Chloride: 107 mmol/L — ABNORMAL HIGH (ref 96–106)
Creatinine, Ser: 0.59 mg/dL (ref 0.57–1.00)
Globulin, Total: 2.1 g/dL (ref 1.5–4.5)
Glucose: 90 mg/dL (ref 70–99)
Potassium: 3.8 mmol/L (ref 3.5–5.2)
Sodium: 142 mmol/L (ref 134–144)
Total Protein: 6 g/dL (ref 6.0–8.5)
eGFR: 103 mL/min/{1.73_m2} (ref >59)

## 2023-05-25 LAB — LIPID PANEL
Chol/HDL Ratio: 2.6 ratio (ref 0.0–4.4)
Cholesterol, Total: 191 mg/dL (ref 100–199)
HDL: 73 mg/dL (ref >39)
LDL Chol Calc (NIH): 104 mg/dL — ABNORMAL HIGH (ref 0–99)
Triglycerides: 75 mg/dL (ref 0–149)
VLDL Cholesterol Cal: 14 mg/dL (ref 5–40)

## 2023-05-25 LAB — VITAMIN D 25 HYDROXY (VIT D DEFICIENCY, FRACTURES): Vit D, 25-Hydroxy: 20.1 ng/mL — ABNORMAL LOW (ref 30.0–100.0)

## 2023-05-25 LAB — CBC
Hematocrit: 37.7 % (ref 34.0–46.6)
Hemoglobin: 12.5 g/dL (ref 11.1–15.9)
MCH: 31.6 pg (ref 26.6–33.0)
MCHC: 33.2 g/dL (ref 31.5–35.7)
MCV: 95 fL (ref 79–97)
Platelets: 285 10*3/uL (ref 150–450)
RBC: 3.95 x10E6/uL (ref 3.77–5.28)
RDW: 12.4 % (ref 11.7–15.4)
WBC: 4.8 10*3/uL (ref 3.4–10.8)

## 2023-05-25 LAB — TSH: TSH: 1.12 u[IU]/mL (ref 0.450–4.500)

## 2023-05-25 MED ORDER — CLENPIQ 10-3.5-12 MG-GM -GM/175ML PO SOLN
1.0000 | ORAL | 0 refills | Status: DC
  Filled 2023-05-25 (×2): qty 175, 1d supply, fill #0
  Filled 2023-05-26: qty 350, 2d supply, fill #0

## 2023-05-25 NOTE — Telephone Encounter (Signed)
Called pt. She wants Dr. Levon Hedger to do procedure. Scheduled with him for 12/6. Aware will send instructions and rx to be sent to pharmacy.

## 2023-05-25 NOTE — Telephone Encounter (Signed)
Questionnaire from recall, no referral needed  

## 2023-05-25 NOTE — Addendum Note (Signed)
Addended by: Armstead Peaks on: 05/25/2023 09:52 AM   Modules accepted: Orders

## 2023-05-26 ENCOUNTER — Other Ambulatory Visit: Payer: Self-pay

## 2023-05-26 ENCOUNTER — Other Ambulatory Visit (HOSPITAL_COMMUNITY): Payer: Self-pay

## 2023-05-27 ENCOUNTER — Ambulatory Visit (INDEPENDENT_AMBULATORY_CARE_PROVIDER_SITE_OTHER): Payer: 59 | Admitting: Family Medicine

## 2023-05-27 ENCOUNTER — Encounter: Payer: Self-pay | Admitting: Family Medicine

## 2023-05-27 VITALS — BP 130/74 | HR 82 | Ht 62.0 in | Wt 142.1 lb

## 2023-05-27 DIAGNOSIS — G8929 Other chronic pain: Secondary | ICD-10-CM

## 2023-05-27 DIAGNOSIS — M8580 Other specified disorders of bone density and structure, unspecified site: Secondary | ICD-10-CM

## 2023-05-27 DIAGNOSIS — R32 Unspecified urinary incontinence: Secondary | ICD-10-CM

## 2023-05-27 DIAGNOSIS — Z780 Asymptomatic menopausal state: Secondary | ICD-10-CM

## 2023-05-27 DIAGNOSIS — E559 Vitamin D deficiency, unspecified: Secondary | ICD-10-CM | POA: Diagnosis not present

## 2023-05-27 DIAGNOSIS — E785 Hyperlipidemia, unspecified: Secondary | ICD-10-CM | POA: Diagnosis not present

## 2023-05-27 DIAGNOSIS — M25511 Pain in right shoulder: Secondary | ICD-10-CM | POA: Diagnosis not present

## 2023-05-27 NOTE — Progress Notes (Signed)
   Holly Murphy     MRN: [id number]      DOB: 03/29/1961  Chief Complaint  Patient presents with   Follow-up    Follow up bladder issues getting worse    HPI Holly Murphy is here for follow up and re-evaluation of chronic medical conditions, medication management and review of any available recent lab and radiology data.  Preventive health is updated, specifically  Cancer screening and Immunization.  Holding off of future covid vaccines, and has colonoscopy  scheduled Had covid recently, no residual symptoms C/o progression in problems with voiding, difficult /poor stream, incomplete emptying with the need to void often, will consider re evaluation for the problem especially as she is aware that there a newer  and different treatment options, medication has been of no benefit , not surprisingly, based on symptoms reported ROS Denies recent fever or chills. Denies sinus pressure, nasal congestion, ear pain or sore throat. Denies chest congestion, productive cough or wheezing. Denies chest pains, palpitations and leg swelling Denies abdominal pain, nausea, vomiting,diarrhea or constipation.   Denies dysuria, frequency, hesitancy or incontinence. Denies  uncontrolled joint pain, swelling and limitation in mobility. Denies headaches, seizures, numbness, or tingling. Denies depression, c/o mild anxiety  and intermittent insomnia due to poor health of her Father and  brother , her only sibling Denies skin break down or rash.   PE  BP 130/74 (BP Location: Left Arm, Patient Position: Sitting, Cuff Size: Normal)   Pulse 82   Ht 5\' 2"  (1.575 m)   Wt 142 lb 1.9 oz (64.5 kg)   SpO2 98%   BMI 25.99 kg/m   Patient alert and oriented and in no cardiopulmonary distress.  HEENT: No facial asymmetry, EOMI,     Neck supple .  Chest: Clear to auscultation bilaterally.  CVS: S1, S2 no murmurs, no S3.Regular rate.  Ext: No edema  MS: Adequate ROM spine, shoulders, hips and  knees.  Skin: Intact, no ulcerations or rash noted.  Psych: Good eye contact, normal affect. Memory intact not anxious or depressed appearing.  CNS: CN 2-12 intact, power,  normal throughout.no focal deficits noted.   Assessment & Plan  Post-menopause Needs dexa  Osteopenia Needs rept dexa Encouraged to commit to wrifght bearing exercise at least 150 min/ week and daily calcium and vit D supplement  Dyslipidemia, goal LDL below 100 Hyperlipidemia:Low fat diet discussed and encouraged.   Lipid Panel  Lab Results  Component Value Date   CHOL 191 05/24/2023   HDL 73 05/24/2023   LDLCALC 104 (H) 05/24/2023   TRIG 75 05/24/2023   CHOLHDL 2.6 05/24/2023      Improved significantly , she is applauded on this  Chronic right shoulder pain Has had ortho eval, no change  in symptom  Urinary incontinence Will seek 2nd opinion re thi problem wich is worsening , will message for referral if needed

## 2023-05-27 NOTE — Patient Instructions (Addendum)
Annual exam in May, please call if you need me sooner Please  take vit D as prescribed.  You are referred for dexa pls schedule  Vit D 5 to 7 days before next visit  Please schedule mammogram due next month   Reach out for referral if needed re bladder symptoms  Thanks for choosing  Primary Care, we consider it a privelige to serve you.

## 2023-05-28 ENCOUNTER — Encounter: Payer: 59 | Admitting: Internal Medicine

## 2023-05-31 DIAGNOSIS — Z780 Asymptomatic menopausal state: Secondary | ICD-10-CM | POA: Insufficient documentation

## 2023-05-31 NOTE — Assessment & Plan Note (Signed)
Needs dexa

## 2023-05-31 NOTE — Assessment & Plan Note (Signed)
Has had ortho eval, no change  in symptom

## 2023-05-31 NOTE — Assessment & Plan Note (Signed)
Will seek 2nd opinion re thi problem wich is worsening , will message for referral if needed

## 2023-05-31 NOTE — Assessment & Plan Note (Signed)
Needs rept dexa Encouraged to commit to wrifght bearing exercise at least 150 min/ week and daily calcium and vit D supplement

## 2023-05-31 NOTE — Assessment & Plan Note (Addendum)
Hyperlipidemia:Low fat diet discussed and encouraged.   Lipid Panel  Lab Results  Component Value Date   CHOL 191 05/24/2023   HDL 73 05/24/2023   LDLCALC 104 (H) 05/24/2023   TRIG 75 05/24/2023   CHOLHDL 2.6 05/24/2023      Improved significantly , she is applauded on this

## 2023-06-02 ENCOUNTER — Other Ambulatory Visit (HOSPITAL_COMMUNITY): Payer: Self-pay | Admitting: Family Medicine

## 2023-06-02 DIAGNOSIS — Z1231 Encounter for screening mammogram for malignant neoplasm of breast: Secondary | ICD-10-CM

## 2023-06-03 ENCOUNTER — Ambulatory Visit: Payer: 59 | Admitting: Obstetrics and Gynecology

## 2023-06-03 ENCOUNTER — Encounter: Payer: Self-pay | Admitting: Obstetrics and Gynecology

## 2023-06-03 ENCOUNTER — Other Ambulatory Visit (HOSPITAL_COMMUNITY): Payer: Self-pay

## 2023-06-03 ENCOUNTER — Other Ambulatory Visit: Payer: Self-pay | Admitting: Family Medicine

## 2023-06-03 VITALS — BP 135/75 | HR 76 | Ht 61.0 in | Wt 143.2 lb

## 2023-06-03 DIAGNOSIS — N952 Postmenopausal atrophic vaginitis: Secondary | ICD-10-CM

## 2023-06-03 DIAGNOSIS — R350 Frequency of micturition: Secondary | ICD-10-CM

## 2023-06-03 DIAGNOSIS — N3281 Overactive bladder: Secondary | ICD-10-CM | POA: Diagnosis not present

## 2023-06-03 DIAGNOSIS — M6289 Other specified disorders of muscle: Secondary | ICD-10-CM

## 2023-06-03 DIAGNOSIS — L900 Lichen sclerosus et atrophicus: Secondary | ICD-10-CM | POA: Diagnosis not present

## 2023-06-03 LAB — POCT URINALYSIS DIPSTICK
Bilirubin, UA: NEGATIVE
Blood, UA: NEGATIVE
Glucose, UA: NEGATIVE
Ketones, UA: NEGATIVE
Leukocytes, UA: NEGATIVE
Nitrite, UA: 0.2
Protein, UA: NEGATIVE
Spec Grav, UA: 1.015 (ref 1.010–1.025)
Urobilinogen, UA: 0.2 U/dL
pH, UA: 6 (ref 5.0–8.0)

## 2023-06-03 MED ORDER — ESTRADIOL 0.1 MG/GM VA CREA
0.5000 g | TOPICAL_CREAM | VAGINAL | 11 refills | Status: DC
  Filled 2023-06-03: qty 42.5, 90d supply, fill #0
  Filled 2023-09-21: qty 42.5, 90d supply, fill #1

## 2023-06-03 MED ORDER — CLOBETASOL PROPIONATE E 0.05 % EX CREA
1.0000 | TOPICAL_CREAM | Freq: Every evening | CUTANEOUS | 2 refills | Status: AC
  Filled 2023-06-03: qty 30, 60d supply, fill #0
  Filled 2023-09-21: qty 30, 60d supply, fill #1

## 2023-06-03 NOTE — Patient Instructions (Signed)
Today we talked about ways to manage bladder urgency such as altering your diet to avoid irritative beverages and foods (bladder diet) as well as attempting to decrease stress and other exacerbating factors.  You can also chew a plain Tums 1-3 times per day to make your urine less acidic, especially if you have eating/drinking acidic things.    The Most Bothersome Foods* The Least Bothersome Foods*  Coffee - Regular & Decaf Tea - caffeinated Carbonated beverages - cola, non-colas, diet & caffeine-free Alcohols - Beer, Red Wine, White Wine, [address] - Grapefruit, [place], Orange, [employer] - Cranberry, Grapefruit, Orange, Pineapple Vegetables - Tomato & Tomato Products Flavor Enhancers - Hot peppers, Spicy foods, Chili, Horseradish, Vinegar, Monosodium glutamate (MSG) Artificial Sweeteners - NutraSweet, Sweet 'N Low, Equal (sweetener), Saccharin Ethnic foods - [place], [place], [place] food [employer] - low-fat & whole Fruits - Bananas, Blueberries, Honeydew melon, Pears, Raisins, Watermelon Vegetables - Broccoli, [address] Sprouts, [place], Carrots, Cauliflower, [place], Cucumber, Mushrooms, Peas, Radishes, Squash, Zucchini, White potatoes, Sweet potatoes & yams Poultry - Chicken, Eggs, [place], [employer] - Beef, Diplomatic Services operational officer, Lamb Seafood - Shrimp, [place] fish, Salmon Grains - Oat, Rice Snacks - Pretzels, Popcorn  *Lenward Chancellor et al. Diet and its role in interstitial cystitis/bladder pain syndrome (IC/BPS) and comorbid conditions. [employer]. BJU Int. 2012 Jan 11.    Start estrogen cream and use this nightly for 2 weeks. After the initial 2 weeks use the estrogen cream x2 weekly. You can use coconut oil or vitamin E cream on nights you are not using the estrogen cream.   Try to start pelvic floor PT.   For intercourse consider changing positions and using silicon based lubrication.

## 2023-06-03 NOTE — Progress Notes (Signed)
[facility] New Patient Evaluation and Consultation  Referring Provider: Kerri Perches, MD PCP: Kerri Perches, MD Date of Service: 06/03/2023  SUBJECTIVE Chief Complaint: New Patient (Initial Visit) (Shyia A Appleyard is a 62 y.o. female is here for OAB.)  History of Present Illness: Dajuana A Mcfarlen is a 62 y.o. White or Caucasian female seen in consultation at the request of Dr. Lodema Hong for evaluation of bladder urgency and frequency.    Review of records significant for: Has been on Myrbetriq  Urinary Symptoms: Does not leak urine.    Day time voids 6-10.  Nocturia: 3-4 times per night to void. Voiding dysfunction:  empties bladder well.  Patient does not use a catheter to empty bladder.  When urinating, patient feels the need to urinate multiple times in a row Drinks: 24-48oz Diet sun drop per day  UTIs:  0 UTI's in the last year.   Denies history of blood in urine, kidney or bladder stones, pyelonephritis, bladder cancer, and kidney cancer No results found for the last 90 days.   Pelvic Organ Prolapse Symptoms:                  Patient Denies a feeling of a bulge the vaginal area.   Bowel Symptom: Bowel movements: 1 time(s) per day Stool consistency: soft  Straining: no.  Splinting: no.  Incomplete evacuation: no.  Patient Denies accidental bowel leakage / fecal incontinence Bowel regimen: none Last colonoscopy: Date 2014, Results Normal [facility] Colonoscopy          Discontinued - Colonoscopy  Discontinued      Frequency changed to Never automatically (Topic No Longer Applies)   05/24/2013  Surgical Procedure: COLONOSCOPY   Only the first 1 history entries have been loaded, but more history exists.            Sexual Function Sexually active: yes.  Sexual orientation: Straight Pain with sex: Yes, has discomfort due to dryness  Pelvic Pain Denies pelvic pain   Past Medical History:  Past Medical History:  Diagnosis Date    Depression 06/2009   Hearing loss    Lichen sclerosus et atrophicus 07/26/2015   Perimenopausal    Postmenopausal HRT (hormone replacement therapy) 08/29/2015   Seasonal allergies    Situational stress    with Mom's deterioating health . Off celexa for at least 2 months    Vaginal atrophy 07/26/2015     Past Surgical History:   Past Surgical History:  Procedure Laterality Date   COLONOSCOPY N/A 05/24/2013   Procedure: COLONOSCOPY;  Surgeon: Malissa Hippo, MD;  Location: [facility];  Service: Endoscopy;  Laterality: N/A;  930   IR ANGIO INTRA EXTRACRAN SEL COM CAROTID INNOMINATE BILAT MOD SED  05/30/2020   IR ANGIO VERTEBRAL SEL SUBCLAVIAN INNOMINATE BILAT MOD SED  05/30/2020     Past OB/GYN History: [id number] Vaginal deliveries: 2,  Forceps/ Vacuum deliveries: 1, Cesarean section: 0 Menopausal: Yes, at age 29 Last pap smear was 2020.  Any history of abnormal pap smears: no. [facility] PAP     This patient has no relevant Health Maintenance data.       Medications: Patient has a current medication list which includes the following prescription(s): acetaminophen, clobetasol propionate e, ergocalciferol, estradiol, melatonin, and clenpiq.   Allergies: Patient has No Known Allergies.   Social History:  Social History   Tobacco Use   Smoking status: Never   Smokeless tobacco: Never  Vaping Use   Vaping status:  Never Used  Substance Use Topics   Alcohol use: Yes    Comment: Rare/[place]ass   Drug use: No    Relationship status: married Patient lives with husband.   Patient is employed as a Engineer, civil (consulting). Regular exercise: No History of abuse: No  Family History:   Family History  Problem Relation Age of Onset   Diabetes Mother    Heart attack Mother    Hypertension Father    Cancer Maternal Grandmother        uterine   Heart attack Maternal Grandmother    Heart attack Maternal Grandfather    Other Paternal Grandmother        brain tumor   Heart attack Paternal Grandfather     Cancer Other        family history    Diabetes Other        family history    Heart defect Other        family history    Arthritis Other        family history      Review of Systems: Review of Systems  Constitutional:  Negative for chills and fever.  Respiratory:  Negative for cough and shortness of breath.   Cardiovascular:  Negative for chest pain and palpitations.  Gastrointestinal:  Negative for abdominal pain, blood in stool, constipation and diarrhea.  Genitourinary:  Positive for dysuria.  Skin:  Negative for rash.  Neurological:  Negative for weakness.  Psychiatric/Behavioral:  Negative for depression and suicidal ideas.      OBJECTIVE Physical Exam: Vitals:   06/03/23 1418  BP: 135/75  Pulse: 76  Weight: 143 lb 3.2 oz (65 kg)  Height: 5\' 1"  (1.549 m)    Physical Exam Vitals reviewed. Exam conducted with a chaperone present.  Constitutional:      Appearance: Normal appearance.  Pulmonary:     Effort: Pulmonary effort is normal.  Abdominal:     Palpations: Abdomen is soft.  Skin:    General: Skin is warm and dry.     Comments: Lichen's sclerosus noted around rectal region and vaginal lips.   Neurological:     General: No focal deficit present.     Mental Status: She is alert and oriented to person, place, and time.  Psychiatric:        Mood and Affect: Mood normal.        Behavior: Behavior normal. Behavior is cooperative.        Thought Content: Thought content normal.      GU / Detailed Urogynecologic Evaluation:  Pelvic Exam: Normal external female genitalia; Bartholin's and Skene's glands normal in appearance; urethral meatus normal in appearance, no urethral masses or discharge.   CST: negative    Speculum exam reveals normal vaginal mucosa with atrophy. Cervix normal appearance. Uterus normal single, nontender. Adnexa normal adnexa.     With apex supported, anterior compartment defect was reduced  Pelvic floor strength I/V  Pelvic  floor musculature: Right levator tender, Right obturator non-tender, Left levator tender, Left obturator non-tender  POP-Q:   POP-Q  -2.5                                            Aa   -2.5  Ba  -6.5                                              C   3                                            Gh  3                                            Pb  9                                            tvl   -3                                            Ap  -3                                            Bp  -8                                              D      Rectal Exam:  Lichen's noted around anus and perineal region.  Post-Void Residual (PVR) by Bladder Scan: In order to evaluate bladder emptying, we discussed obtaining a postvoid residual and patient agreed to this procedure.  Procedure: The ultrasound unit was placed on the patient's abdomen in the suprapubic region after the patient had voided.    Post Void Residual - 06/03/23 1432       Post Void Residual   Post Void Residual 10 mL              Laboratory Results: Lab Results  Component Value Date   COLORU yellow 06/03/2023   CLARITYU clear 06/03/2023   GLUCOSEUR Negative 06/03/2023   BILIRUBINUR negative 06/03/2023   KETONESU negative 06/03/2023   SPECGRAV 1.015 06/03/2023   RBCUR negative 06/03/2023   PHUR 6.0 06/03/2023   PROTEINUR Negative 06/03/2023   UROBILINOGEN 0.2 06/03/2023   LEUKOCYTESUR Negative 06/03/2023    Lab Results  Component Value Date   CREATININE 0.59 05/24/2023   CREATININE 0.57 08/28/2021   CREATININE 0.58 10/31/2020    Lab Results  Component Value Date   HGBA1C 5.2 08/28/2021    Lab Results  Component Value Date   HGB 12.5 05/24/2023     ASSESSMENT AND PLAN Ms. Allton is a 62 y.o. with:  1. Urinary frequency   2. Overactive bladder   3. Pelvic floor dysfunction   4. Vaginal atrophy   5. Lichen sclerosus et  atrophicus     We discussed the symptoms of overactive bladder (OAB), which include urinary urgency,  urinary frequency, nocturia, with or without urge incontinence.  While we do not know the exact etiology of OAB, several treatment options exist. We discussed management including behavioral therapy (decreasing bladder irritants, urge suppression strategies, timed voids, bladder retraining), physical therapy, medication; for refractory cases posterior tibial nerve stimulation, sacral neuromodulation, and intravesical botulinum toxin injection. For anticholinergic medications, we discussed the potential side effects of anticholinergics including dry eyes, dry mouth, constipation, cognitive impairment and urinary retention. For Beta-3 agonist medication, we discussed the potential side effect of elevated blood pressure which is more likely to occur in individuals with uncontrolled hypertension. 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  Will plan to start conservatively at this time. Pelvic floor PT referral placed and estrogen cream ordered for vaginal atrophy which may help the irritation after urination.  For patient's pelvic floor dysfunction we discussed starting pelvic floor pt, changing positions with intercourse if there is pain, and using lubrication for intercourse to decrease muscle spasms and friction irritation.  Patient has vaginal atrophy on exam. She would benefit from estrogen cream. Patient to use a blueberry sized amount into the vagina. She may use this nightly for 2 weeks and then twice weekly after. We discussed using her finger instead of using the applicator. Also discussed using coconut oil or vitamin E cream on nights she does not use estrogen.  Patient has known lichen's sclerosus and reports she has not been using clobetasol for a bit. Will restart her clobetasol cream nightly. A mirror was used to show her wear to apply as  this does include around the perineum and rectum.   Patient to return in 2-3 months or sooner if needed.   Selmer Dominion, NP

## 2023-06-04 ENCOUNTER — Other Ambulatory Visit: Payer: Self-pay

## 2023-06-04 ENCOUNTER — Other Ambulatory Visit (HOSPITAL_COMMUNITY): Payer: Self-pay

## 2023-06-04 MED ORDER — ERGOCALCIFEROL 1.25 MG (50000 UT) PO CAPS
1.0000 | ORAL_CAPSULE | ORAL | 3 refills | Status: AC
  Filled 2023-06-04: qty 6, 84d supply, fill #0
  Filled 2023-10-31: qty 6, 84d supply, fill #1
  Filled 2024-02-18: qty 6, 84d supply, fill #2
  Filled 2024-06-02: qty 6, 84d supply, fill #3

## 2023-06-05 ENCOUNTER — Other Ambulatory Visit (HOSPITAL_COMMUNITY): Payer: Self-pay

## 2023-06-07 ENCOUNTER — Ambulatory Visit: Payer: 59 | Attending: Obstetrics and Gynecology | Admitting: Physical Therapy

## 2023-06-07 ENCOUNTER — Other Ambulatory Visit: Payer: Self-pay

## 2023-06-07 ENCOUNTER — Encounter: Payer: Self-pay | Admitting: Physical Therapy

## 2023-06-07 DIAGNOSIS — R350 Frequency of micturition: Secondary | ICD-10-CM | POA: Diagnosis not present

## 2023-06-07 DIAGNOSIS — M6289 Other specified disorders of muscle: Secondary | ICD-10-CM | POA: Insufficient documentation

## 2023-06-07 DIAGNOSIS — N3281 Overactive bladder: Secondary | ICD-10-CM | POA: Insufficient documentation

## 2023-06-07 DIAGNOSIS — M6281 Muscle weakness (generalized): Secondary | ICD-10-CM

## 2023-06-07 NOTE — Therapy (Signed)
OUTPATIENT PHYSICAL THERAPY FEMALE PELVIC EVALUATION   Patient Name: Holly Murphy MRN: [id number] DOB:04/09/1961, 62 y.o., female Today's Date: 06/07/2023  END OF SESSION:  PT End of Session - 06/07/23 1858     Visit Number 1    PT Start Time 1530    PT Stop Time 1630    PT Time Calculation (min) 60 min    Activity Tolerance Patient tolerated treatment well    Behavior During Therapy [facility] for tasks assessed/performed             Past Medical History:  Diagnosis Date   Depression 06/2009   Hearing loss    Lichen sclerosus et atrophicus 07/26/2015   Perimenopausal    Postmenopausal HRT (hormone replacement therapy) 08/29/2015   Seasonal allergies    Situational stress    with Mom's deterioating health . Off celexa for at least 2 months    Vaginal atrophy 07/26/2015   Past Surgical History:  Procedure Laterality Date   COLONOSCOPY N/A 05/24/2013   Procedure: COLONOSCOPY;  Surgeon: Malissa Hippo, MD;  Location: [facility];  Service: Endoscopy;  Laterality: N/A;  930   IR ANGIO INTRA EXTRACRAN SEL COM CAROTID INNOMINATE BILAT MOD SED  05/30/2020   IR ANGIO VERTEBRAL SEL SUBCLAVIAN INNOMINATE BILAT MOD SED  05/30/2020   Patient Active Problem List   Diagnosis Date Noted   Post-menopause 05/31/2023   Periodic health assessment, Pap and pelvic 03/03/2022   Chronic right shoulder pain 10/14/2020   Back pain with radiation 06/24/2020   Neck pain of over 3 months duration 06/24/2020   Elevated blood-pressure reading, without diagnosis of hypertension 06/24/2020   Subarachnoid hemorrhage (HCC) 05/24/2020   Urinary incontinence 03/02/2020   Annual physical exam 08/31/2018   Hemorrhoids 05/30/2017   Lichen sclerosus et atrophicus 07/26/2015   Vaginal atrophy 07/26/2015   Dyslipidemia, goal LDL below 100 04/16/2014   Osteopenia 12/27/2013   Vitamin D deficiency 11/15/2010   Backache 04/18/2010   NONSPECIFIC ABNORMAL ELECTROCARDIOGRAM 12/02/2009    PCP: Kerri Perches, MD   REFERRING PROVIDER: Selmer Dominion, NP Ref Provider   REFERRING DIAG:  R35.0 (ICD-10-CM) - Urinary frequency  M62.89 (ICD-10-CM) - Pelvic floor dysfunction  N32.81 (ICD-10-CM) - Overactive bladder   THERAPY DIAG:  Muscle weakness (generalized)  Rationale for Evaluation and Treatment: Rehabilitation  ONSET DATE: 2020  SUBJECTIVE:                                                                                                                                                                                           SUBJECTIVE STATEMENT: Pt reports that this has been  going on for 4-5 years, used to be occasional, she has frequency every day. Occasionally irritation, empties her bladder, feels like she has to get up at night. Has to get up 3-4 times/ night. Especially worrisome. Ok when she is in her office, bothersome on vacation.  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 Diet sundrop 3-4 / day 12 oz bottles, a little bit of water / day    PAIN:  Are you having pain? Yes low back pain- muscular with certain movements, stiffness NPRS scale: 4/10 Pain location: low back and some vaginal burning, itching and irritating from lichen's, once/ week bladder spasms  Pain type: aching Pain description: intermittent   Aggravating factors: sleeping too long Relieving factors: moving  PRECAUTIONS: None  RED FLAGS: None   WEIGHT BEARING RESTRICTIONS: No  FALLS:  Has patient fallen in last 6 months? No  LIVING ENVIRONMENT: Lives with: lives with their family Lives in: House/apartment Stairs: No Has following equipment at home: None  OCCUPATION: Risk analyst  PLOF: Independent  PATIENT GOALS: to see if PT makes a difference  PERTINENT HISTORY:  Lichen sclerosus Vaginal atrophy  Sexual abuse: No  BOWEL MOVEMENT: no  issues URINATION: Pain with urination: No Fully empty bladder: No Stream: Strong Urgency: Yes: not all the time, can be one day and not the next day Frequency: every 1-2 hrs. At night 2-3 hrs Leakage:  no Pads: No  INTERCOURSE: n/a   PREGNANCY: Vaginal deliveries 2 Tearing Yes:   C-section deliveries 0 Currently pregnant No  PROLAPSE: Pt is not sure   OBJECTIVE:  Note: Objective measures were completed at Evaluation unless otherwise noted.    COGNITION: Overall cognitive status: Within functional limits for tasks assessed     SENSATION: Light touch: Appears intact Proprioception: Appears intact  MUSCLE LENGTH: Hamstrings: Right 80 deg; Left 80 deg   LUMBAR SPECIAL TESTS:  Single leg stance test: Positive- dips bilat     POSTURE: rounded shoulders, forward head, and decreased lumbar lordosis  PELVIC ALIGNMENT: seems even  LUMBARAROM/PROM:  A/PROM A/PROM  eval  Flexion Within functional limitations   Extension   Right lateral flexion   Left lateral flexion   Right rotation   Left rotation    (Blank rows = not tested)  LOWER EXTREMITY ROM:  Passive ROM Right eval Left eval  Hip flexion    Hip extension    Hip abduction    Hip adduction    Hip internal rotation limited limited  Hip external rotation limited limited  Knee flexion    Knee extension    Ankle dorsiflexion    Ankle plantarflexion    Ankle inversion    Ankle eversion     (Blank rows = not tested)  LOWER EXTREMITY MMT: weakness throughout hips PALPATION:   General  able to dem diaphragmatic breathing                External Perineal Exam within functional limitations                              Internal Pelvic Floor pelvic floor weakness  Patient confirms identification and approves PT to assess internal pelvic floor and treatment Yes  PELVIC MMT:   MMT eval  Vaginal 1/5  Internal Anal Sphincter   External Anal Sphincter   Puborectalis  Diastasis Recti   (Blank  rows = not tested)        TONE: low  PROLAPSE:             Mild anterior vaginal wall laxity  TODAY'S TREATMENT:                                                                                                                              DATE: 06/07/2023  EVAL see below   PATIENT EDUCATION:  Education details/ there act: bladder irritants, urge drill, comfy TENS for tibial nerve stimulation, HEP, relevant anatomy Person educated: Patient Education method: Explanation, Demonstration, Tactile cues, Verbal cues, and Handouts Education comprehension: verbalized understanding  HOME EXERCISE PROGRAM:  [id number]  ASSESSMENT:  CLINICAL IMPRESSION: Patient is a 62 y.o. F who was seen today for physical therapy evaluation and treatment for OAB. She demonstrates pelvic floor weakness along with some glute weakness, high intake of bladder irritants and will benefit from pelvic PT to reduce urinary urgency. 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 She has urinary urgency and frequency throughout the day and night worsening over the last 5 years and will benefit from PT to improve pelvic floor strength and bladder retraining.    OBJECTIVE IMPAIRMENTS: decreased coordination, decreased knowledge of condition, decreased ROM, decreased strength, impaired tone, and pain.   ACTIVITY LIMITATIONS: sleeping and toileting  PARTICIPATION LIMITATIONS: community activity  PERSONAL FACTORS: Time since onset of injury/illness/exacerbation are also affecting patient's functional outcome.   REHAB POTENTIAL: Good  CLINICAL DECISION MAKING: Stable/uncomplicated  EVALUATION COMPLEXITY: Low   GOALS: Goals reviewed with patient? Yes  SHORT TERM GOALS: Target date: 07/05/2023    Pt will be I with her HEP and dem all exercises correctly Baseline: Goal status:  INITIAL  2.  Pt will dem reduced frequency and urgency to 2hrs between voids Baseline:  Goal status: INITIAL  3.  Pt will teach back urge suppression strategies Baseline:  Goal status: INITIAL   LONG TERM GOALS: Target date: 08/30/2023    Pt will report max 1 nighttime void/day Baseline:  Goal status: INITIAL  2.  Pt will dem improved PF contraction to at least 3/5  Baseline:  Goal status: INITIAL  3.  Pt will dem improved bilateral hip strength to at least 4/5  Baseline:  Goal status: INITIAL  4.  Pt will dem urge suppression strategies 100% of time Baseline:  Goal status: INITIAL  5.  Pt will be I with advanced HEP Baseline:  Goal status: INITIAL    PLAN:  PT FREQUENCY: 1-2x/week  PT DURATION: 12 weeks  PLANNED INTERVENTIONS: 97110-Therapeutic exercises, 97530- Therapeutic activity, 97112- Neuromuscular re-education, 97535- Self Care, 09811- Manual therapy, Taping, Dry Needling, Joint mobilization, Joint manipulation, Spinal manipulation, Spinal mobilization, Manual lymph drainage, Scar mobilization, Moist heat, and Biofeedback  PLAN FOR NEXT  SESSION: cont pelvic floor strengthening   Alfonse Garringer, PT 06/07/23 7:21 PM

## 2023-06-08 ENCOUNTER — Other Ambulatory Visit (HOSPITAL_COMMUNITY): Payer: Self-pay

## 2023-06-08 ENCOUNTER — Other Ambulatory Visit (HOSPITAL_COMMUNITY): Payer: 59

## 2023-06-08 ENCOUNTER — Other Ambulatory Visit: Payer: Self-pay

## 2023-06-08 ENCOUNTER — Ambulatory Visit (HOSPITAL_COMMUNITY)
Admission: RE | Admit: 2023-06-08 | Discharge: 2023-06-08 | Disposition: A | Discharge status: Home / Self Care | Payer: 59 | Source: Ambulatory Visit | Attending: Family Medicine | Admitting: Family Medicine

## 2023-06-08 DIAGNOSIS — M8580 Other specified disorders of bone density and structure, unspecified site: Secondary | ICD-10-CM | POA: Diagnosis not present

## 2023-06-08 DIAGNOSIS — Z780 Asymptomatic menopausal state: Secondary | ICD-10-CM | POA: Diagnosis not present

## 2023-06-15 ENCOUNTER — Other Ambulatory Visit (HOSPITAL_COMMUNITY): Payer: Self-pay

## 2023-06-15 ENCOUNTER — Other Ambulatory Visit: Payer: Self-pay

## 2023-06-15 MED ORDER — CLENPIQ 10-3.5-12 MG-GM -GM/175ML PO SOLN
1.0000 | ORAL | 0 refills | Status: DC
  Filled 2023-06-15: qty 350, 2d supply, fill #0

## 2023-06-15 NOTE — Addendum Note (Signed)
Addended by: Armstead Peaks on: 06/15/2023 10:38 AM   Modules accepted: Orders

## 2023-06-15 NOTE — Telephone Encounter (Signed)
Patient put her prep in the fridge. Will send new RX in.

## 2023-06-18 ENCOUNTER — Encounter (HOSPITAL_COMMUNITY)
Admission: RE | Disposition: A | Discharge status: Home / Self Care | Payer: Self-pay | Source: Home / Self Care | Attending: Gastroenterology

## 2023-06-18 ENCOUNTER — Encounter (HOSPITAL_COMMUNITY): Payer: Self-pay | Admitting: Gastroenterology

## 2023-06-18 ENCOUNTER — Other Ambulatory Visit: Payer: Self-pay

## 2023-06-18 ENCOUNTER — Ambulatory Visit (HOSPITAL_COMMUNITY)
Admission: RE | Admit: 2023-06-18 | Discharge: 2023-06-18 | Disposition: A | Discharge status: Home / Self Care | Payer: 59 | Attending: Gastroenterology | Admitting: Gastroenterology

## 2023-06-18 ENCOUNTER — Ambulatory Visit (HOSPITAL_COMMUNITY): Payer: 59 | Admitting: Anesthesiology

## 2023-06-18 DIAGNOSIS — Z1211 Encounter for screening for malignant neoplasm of colon: Secondary | ICD-10-CM | POA: Insufficient documentation

## 2023-06-18 DIAGNOSIS — F32A Depression, unspecified: Secondary | ICD-10-CM | POA: Diagnosis not present

## 2023-06-18 DIAGNOSIS — K648 Other hemorrhoids: Secondary | ICD-10-CM | POA: Diagnosis not present

## 2023-06-18 DIAGNOSIS — D122 Benign neoplasm of ascending colon: Secondary | ICD-10-CM | POA: Diagnosis not present

## 2023-06-18 DIAGNOSIS — Z139 Encounter for screening, unspecified: Secondary | ICD-10-CM | POA: Diagnosis not present

## 2023-06-18 DIAGNOSIS — K635 Polyp of colon: Secondary | ICD-10-CM | POA: Diagnosis not present

## 2023-06-18 HISTORY — PX: POLYPECTOMY: SHX5525

## 2023-06-18 HISTORY — PX: SUBMUCOSAL LIFTING INJECTION: SHX6855

## 2023-06-18 HISTORY — PX: COLONOSCOPY WITH PROPOFOL: SHX5780

## 2023-06-18 LAB — HM COLONOSCOPY

## 2023-06-18 SURGERY — COLONOSCOPY WITH PROPOFOL
Anesthesia: General

## 2023-06-18 MED ORDER — PROPOFOL 10 MG/ML IV BOLUS
INTRAVENOUS | Status: DC | PRN
  Administered 2023-06-18: 100 mg via INTRAVENOUS

## 2023-06-18 MED ORDER — LACTATED RINGERS IV SOLN: INTRAVENOUS | Status: DC

## 2023-06-18 MED ORDER — LIDOCAINE HCL (PF) 2 % IJ SOLN
INTRAMUSCULAR | Status: DC | PRN
  Administered 2023-06-18: 50 mg via INTRADERMAL

## 2023-06-18 MED ORDER — PROPOFOL 500 MG/50ML IV EMUL
INTRAVENOUS | Status: DC | PRN
  Administered 2023-06-18: 150 ug/kg/min via INTRAVENOUS

## 2023-06-18 NOTE — Transfer of Care (Signed)
Immediate Anesthesia Transfer of Care Note  Patient: Holly Murphy  Procedure(s) Performed: COLONOSCOPY WITH PROPOFOL SUBMUCOSAL LIFTING INJECTION POLYPECTOMY  Patient Location: Endoscopy Unit  Anesthesia Type:General  Level of Consciousness: awake  Airway & Oxygen Therapy: Patient Spontanous Breathing  Post-op Assessment: Report given to RN  Post vital signs: Reviewed and stable  Last Vitals:  Vitals Value Taken Time  BP    Temp    Pulse    Resp    SpO2      Last Pain:  Vitals:   06/18/23 1310  TempSrc:   PainSc: 0-No pain      Patients Stated Pain Goal: 5 (06/18/23 1139)  Complications: No notable events documented.

## 2023-06-18 NOTE — H&P (Signed)
Holly Murphy is an 62 y.o. female.   Chief Complaint: screening colonoscopy HPI: 62 y/o F with PMH depression, coming for screening colonoscopy.  Last colonoscopy in 2013, was normal.  The patient denies having any complaints such as melena, hematochezia, abdominal pain or distention, change in her bowel movement consistency or frequency, no changes in weight recently.  No family history of colorectal cancer.   Past Medical History:  Diagnosis Date   Depression 06/2009   Hearing loss    Lichen sclerosus et atrophicus 07/26/2015   Perimenopausal    Postmenopausal HRT (hormone replacement therapy) 08/29/2015   Seasonal allergies    Situational stress    with Mom's deterioating health . Off celexa for at least 2 months    Vaginal atrophy 07/26/2015    Past Surgical History:  Procedure Laterality Date   COLONOSCOPY N/A 05/24/2013   Procedure: COLONOSCOPY;  Surgeon: Malissa Hippo, MD;  Location: [facility];  Service: Endoscopy;  Laterality: N/A;  930   IR ANGIO INTRA EXTRACRAN SEL COM CAROTID INNOMINATE BILAT MOD SED  05/30/2020   IR ANGIO VERTEBRAL SEL SUBCLAVIAN INNOMINATE BILAT MOD SED  05/30/2020    Family History  Problem Relation Age of Onset   Diabetes Mother    Heart attack Mother    Hypertension Father    Cancer Maternal Grandmother        uterine   Heart attack Maternal Grandmother    Heart attack Maternal Grandfather    Other Paternal Grandmother        brain tumor   Heart attack Paternal Grandfather    Cancer Other        family history    Diabetes Other        family history    Heart defect Other        family history    Arthritis Other        family history    Social History:  reports that she has never smoked. She has never used smokeless tobacco. She reports current alcohol use. She reports that she does not use drugs.  Allergies: No Known Allergies  Medications Prior to Admission  Medication Sig Dispense Refill   Clobetasol Prop Emollient Base  (CLOBETASOL PROPIONATE E) 0.05 % emollient cream Apply 1 Application topically at bedtime. 30 g 2   ergocalciferol (VITAMIN D2) 1.25 MG (50000 UT) capsule Take 1 capsule by mouth every 14 days. 6 capsule 3   melatonin 5 MG TABS Take 5 mg by mouth at bedtime as needed (For sleep).     Sod Picosulfate-Mag Ox-Cit Acd (CLENPIQ) 10-3.5-12 MG-GM -GM/175ML SOLN Take 1 kit by mouth as directed. 350 mL 0   acetaminophen (TYLENOL) 500 MG tablet Take 500 mg by mouth every 6 (six) hours as needed for mild pain.     estradiol (ESTRACE) 0.1 MG/GM vaginal cream Place 0.5 g vaginally 2 (two) times a week. Place 0.5g nightly for two weeks then twice a week after 42.5 g 11    No results found for this or any previous visit (from the past 48 hour(s)). No results found.  Review of Systems  All other systems reviewed and are negative.   Blood pressure (!) 153/83, pulse 79, temperature 97.9 F (36.6 C), temperature source Oral, resp. rate 12, height 5\' 2"  (1.575 m), weight 62.1 kg, SpO2 99%. Physical Exam  GENERAL: The patient is AO x3, in no acute distress. HEENT: Head is normocephalic and atraumatic. EOMI are intact. Mouth is well  hydrated and without lesions. NECK: Supple. No masses LUNGS: Clear to auscultation. No presence of rhonchi/wheezing/rales. Adequate chest expansion HEART: RRR, normal s1 and s2. ABDOMEN: Soft, nontender, no guarding, no peritoneal signs, and nondistended. BS +. No masses. EXTREMITIES: Without any cyanosis, clubbing, rash, lesions or edema. NEUROLOGIC: AOx3, no focal motor deficit. SKIN: no jaundice, no rashes  Assessment/Plan 62 y/o F with PMH depression, coming for screening colonoscopy. The patient is at average risk for colorectal cancer.  We will proceed with colonoscopy today.   Dolores Frame, MD 06/18/2023, 1:04 PM

## 2023-06-18 NOTE — Anesthesia Postprocedure Evaluation (Signed)
Anesthesia Post Note  Patient: Katricia A Walla  Procedure(s) Performed: COLONOSCOPY WITH PROPOFOL SUBMUCOSAL LIFTING INJECTION POLYPECTOMY  Patient location during evaluation: Endoscopy Anesthesia Type: General Level of consciousness: awake and alert Pain management: pain level controlled Vital Signs Assessment: post-procedure vital signs reviewed and stable Respiratory status: spontaneous breathing Cardiovascular status: blood pressure returned to baseline and stable Postop Assessment: no apparent nausea or vomiting Anesthetic complications: no   No notable events documented.   Last Vitals:  Vitals:   06/18/23 1139 06/18/23 1347  BP: (!) 153/83 (!) 107/53  Pulse: 79 68  Resp: 12 15  Temp: 36.6 C 36.7 C  SpO2: 99% 100%    Last Pain:  Vitals:   06/18/23 1347  TempSrc: Oral  PainSc: 0-No pain                 Jaaliyah Lucatero

## 2023-06-18 NOTE — Discharge Instructions (Signed)
You are being discharged to home.  Resume your previous diet.  We are waiting for your pathology results.  Your physician has recommended a repeat colonoscopy in five years for surveillance.  

## 2023-06-18 NOTE — Anesthesia Preprocedure Evaluation (Signed)
Anesthesia Evaluation  Patient identified by MRN, date of birth, ID band Patient awake    Reviewed: Allergy & Precautions, H&P , NPO status , Patient's Chart, lab work & pertinent test results, reviewed documented beta blocker date and time   Airway Mallampati: II  TM Distance: >3 FB Neck ROM: full    Dental no notable dental hx.    Pulmonary neg pulmonary ROS   Pulmonary exam normal breath sounds clear to auscultation       Cardiovascular Exercise Tolerance: Good hypertension, negative cardio ROS  Rhythm:regular Rate:Normal     Neuro/Psych  PSYCHIATRIC DISORDERS  Depression    negative neurological ROS  negative psych ROS   GI/Hepatic negative GI ROS, Neg liver ROS,,,  Endo/Other  negative endocrine ROS    Renal/GU negative Renal ROS  negative genitourinary   Musculoskeletal   Abdominal   Peds  Hematology negative hematology ROS (+)   Anesthesia Other Findings   Reproductive/Obstetrics negative OB ROS                             Anesthesia Physical Anesthesia Plan  ASA: 2  Anesthesia Plan: General   Post-op Pain Management:    Induction:   PONV Risk Score and Plan: Propofol infusion  Airway Management Planned:   Additional Equipment:   Intra-op Plan:   Post-operative Plan:   Informed Consent: I have reviewed the patients History and Physical, chart, labs and discussed the procedure including the risks, benefits and alternatives for the proposed anesthesia with the patient or authorized representative who has indicated his/her understanding and acceptance.     Dental Advisory Given  Plan Discussed with: CRNA  Anesthesia Plan Comments:        Anesthesia Quick Evaluation

## 2023-06-18 NOTE — Op Note (Signed)
[facility] Patient Name: Elloise Thum Procedure Date: 06/18/2023 12:22 PM MRN: [id number] Date of Birth: 09/04/1960 Attending MD: Katrinka Blazing , , [id number] CSN: [id number] Age: 62 Admit Type: Outpatient Procedure:                Colonoscopy Indications:              Screening for colorectal malignant neoplasm Providers:                Katrinka Blazing, Buel Ream. Thomasena Edis RN, RN, Elinor Parkinson Referring MD:             Katrinka Blazing Medicines:                Monitored Anesthesia Care Complications:            No immediate complications. Estimated Blood Loss:     Estimated blood loss: none. Procedure:                Pre-Anesthesia Assessment:                           - Prior to the procedure, a History and Physical                            was performed, and patient medications, allergies                            and sensitivities were reviewed. The patient's                            tolerance of previous anesthesia was reviewed.                           - The risks and benefits of the procedure and the                            sedation options and risks were discussed with the                            patient. All questions were answered and informed                            consent was obtained.                           - ASA Grade Assessment: I - A normal, healthy                            patient.                           After obtaining informed consent, the colonoscope                            was passed under direct vision. Throughout the  procedure, the patient's blood pressure, pulse, and                            oxygen saturations were monitored continuously. The                            [id number]-[id number] ([id number]) scope was introduced through                            the anus and advanced to the the cecum, identified                            by appendiceal orifice and ileocecal  valve. The                            colonoscopy was performed without difficulty. The                            patient tolerated the procedure well. The quality                            of the bowel preparation was good. Scope In: 1:12:17 PM Scope Out: 1:42:12 PM Scope Withdrawal Time: 0 hours 25 minutes 53 seconds  Total Procedure Duration: 0 hours 29 minutes 55 seconds  Findings:      The perianal and digital rectal examinations were normal.      A 12 mm polyp was found in the ascending colon. The polyp was flat. Area       was successfully injected with 1 mL Eleview for a lift polypectomy. The       polyp was removed with a cold snare. Resection and retrieval were       complete.      Non-bleeding internal hemorrhoids were found during retroflexion. The       hemorrhoids were small. Impression:               - One 12 mm polyp in the ascending colon, removed                            with a cold snare. Resected and retrieved. Injected.                           - Non-bleeding internal hemorrhoids. Moderate Sedation:      Per Anesthesia Care Recommendation:           - Discharge patient to home (ambulatory).                           - Resume previous diet.                           - Await pathology results.                           - Repeat colonoscopy in 5 years for surveillance. Procedure Code(s):        --- Professional ---  52841, 59, Colonoscopy, flexible; with removal of                            tumor(s), polyp(s), or other lesion(s) by snare                            technique                           45381, Colonoscopy, flexible; with directed                            submucosal injection(s), any substance Diagnosis Code(s):        --- Professional ---                           Z12.11, Encounter for screening for malignant                            neoplasm of colon                           D12.2, Benign neoplasm of ascending  colon                           K64.8, Other hemorrhoids CPT copyright 2022 American Medical Association. All rights reserved. The codes documented in this report are preliminary and upon coder review may  be revised to meet current compliance requirements. Katrinka Blazing, MD Katrinka Blazing,  06/18/2023 1:51:26 PM This report has been signed electronically. Number of Addenda: 0

## 2023-06-21 LAB — SURGICAL PATHOLOGY

## 2023-06-22 ENCOUNTER — Encounter (INDEPENDENT_AMBULATORY_CARE_PROVIDER_SITE_OTHER): Payer: Self-pay

## 2023-06-22 NOTE — Progress Notes (Signed)
Hi Crystal, Please let her know her polyp was precancerous (sessile serrated polyp) but NO cancer was present. Will repeat colonoscopy in 5 years. Ann, please update recall.  Thanks

## 2023-06-23 ENCOUNTER — Encounter (INDEPENDENT_AMBULATORY_CARE_PROVIDER_SITE_OTHER): Payer: Self-pay | Admitting: *Deleted

## 2023-06-28 ENCOUNTER — Encounter (HOSPITAL_COMMUNITY): Payer: Self-pay | Admitting: Gastroenterology

## 2023-07-02 ENCOUNTER — Encounter: Payer: Self-pay | Admitting: Physical Therapy

## 2023-07-12 ENCOUNTER — Encounter: Payer: Self-pay | Admitting: Physical Therapy

## 2023-07-15 ENCOUNTER — Ambulatory Visit (HOSPITAL_COMMUNITY): Payer: 59

## 2023-07-22 ENCOUNTER — Encounter (HOSPITAL_COMMUNITY): Payer: Self-pay

## 2023-07-22 ENCOUNTER — Ambulatory Visit (HOSPITAL_COMMUNITY)
Admission: RE | Admit: 2023-07-22 | Discharge: 2023-07-22 | Disposition: A | Discharge status: Home / Self Care | Payer: 59 | Source: Ambulatory Visit | Attending: Family Medicine | Admitting: Family Medicine

## 2023-07-22 DIAGNOSIS — Z1231 Encounter for screening mammogram for malignant neoplasm of breast: Secondary | ICD-10-CM | POA: Insufficient documentation

## 2023-08-02 ENCOUNTER — Ambulatory Visit: Payer: 59 | Admitting: Obstetrics and Gynecology

## 2023-08-23 ENCOUNTER — Encounter: Payer: 59 | Admitting: Physical Therapy

## 2023-08-30 ENCOUNTER — Encounter: Payer: 59 | Admitting: Physical Therapy

## 2023-09-06 ENCOUNTER — Ambulatory Visit: Payer: 59 | Admitting: Physical Therapy

## 2023-09-21 ENCOUNTER — Other Ambulatory Visit: Payer: Self-pay

## 2023-09-22 ENCOUNTER — Other Ambulatory Visit (HOSPITAL_COMMUNITY): Payer: Self-pay

## 2023-10-11 ENCOUNTER — Ambulatory Visit: Payer: 59 | Admitting: Obstetrics and Gynecology

## 2023-11-01 ENCOUNTER — Other Ambulatory Visit (HOSPITAL_COMMUNITY): Payer: Self-pay

## 2023-11-08 ENCOUNTER — Ambulatory Visit: Admitting: Obstetrics and Gynecology

## 2023-11-11 DIAGNOSIS — E559 Vitamin D deficiency, unspecified: Secondary | ICD-10-CM | POA: Diagnosis not present

## 2023-11-12 LAB — VITAMIN D 25 HYDROXY (VIT D DEFICIENCY, FRACTURES): Vit D, 25-Hydroxy: 45.1 ng/mL (ref 30.0–100.0)

## 2023-11-22 ENCOUNTER — Telehealth: Admitting: Physician Assistant

## 2023-11-22 DIAGNOSIS — H109 Unspecified conjunctivitis: Secondary | ICD-10-CM | POA: Diagnosis not present

## 2023-11-22 MED ORDER — POLYMYXIN B-TRIMETHOPRIM 10000-0.1 UNIT/ML-% OP SOLN: OPHTHALMIC | 0 refills | Status: AC

## 2023-11-22 NOTE — Progress Notes (Signed)

## 2023-11-22 NOTE — Progress Notes (Signed)
 I have spent 5 minutes in review of e-visit questionnaire, review and updating patient chart, medical decision making and response to patient.   Piedad Climes, PA-C

## 2023-11-25 ENCOUNTER — Other Ambulatory Visit: Payer: Self-pay

## 2023-11-25 ENCOUNTER — Encounter: Payer: Self-pay | Admitting: Family Medicine

## 2023-11-25 ENCOUNTER — Ambulatory Visit (INDEPENDENT_AMBULATORY_CARE_PROVIDER_SITE_OTHER): Payer: 59 | Admitting: Family Medicine

## 2023-11-25 ENCOUNTER — Other Ambulatory Visit (HOSPITAL_COMMUNITY): Payer: Self-pay

## 2023-11-25 VITALS — BP 120/80 | HR 71 | Resp 18 | Ht 62.0 in | Wt 144.0 lb

## 2023-11-25 DIAGNOSIS — F411 Generalized anxiety disorder: Secondary | ICD-10-CM | POA: Diagnosis not present

## 2023-11-25 DIAGNOSIS — Z0000 Encounter for general adult medical examination without abnormal findings: Secondary | ICD-10-CM | POA: Diagnosis not present

## 2023-11-25 DIAGNOSIS — F321 Major depressive disorder, single episode, moderate: Secondary | ICD-10-CM | POA: Insufficient documentation

## 2023-11-25 MED ORDER — ESCITALOPRAM OXALATE 10 MG PO TABS
10.0000 mg | ORAL_TABLET | Freq: Every day | ORAL | 0 refills | Status: DC
  Filled 2023-11-25: qty 90, 90d supply, fill #0

## 2023-11-25 NOTE — Assessment & Plan Note (Signed)
Annual exam as documented. Counseling done  re healthy lifestyle involving commitment to 150 minutes exercise per week, heart healthy diet, and attaining healthy weight.The importance of adequate sleep also discussed. Changes in health habits are decided on by the patient with goals and time frames  set for achieving them. Immunization and cancer screening needs are specifically addressed at this visit. 

## 2023-11-25 NOTE — Progress Notes (Signed)
    Holly Murphy     MRN: [id number]      DOB: [birth year]  Chief Complaint  Patient presents with   Annual Exam    cpe    HPI: Patient is in for annual physical exam. Depression and anxiety are addressed. Recent labs,  are reviewed. Immunization is reviewed , and  is up to date  PE: BP 120/80   Pulse 71   Resp 18   Ht 5\' 2"  (1.575 m)   Wt 144 lb (65.3 kg)   SpO2 98%   BMI 26.34 kg/m   Pleasant  female, alert and oriented x 3, in no cardio-pulmonary distress. Afebrile. HEENT No facial trauma or asymetry. Sinuses non tender.  Extra occullar muscles intact.. External ears normal, . Neck: supple, no adenopathy,JVD or thyromegaly.No bruits.  Chest: Clear to ascultation bilaterally.No crackles or wheezes. Non tender to palpation  Breast: Normal mammogram, 07/2023, asymptomatic, not examined  Cardiovascular system; Heart sounds normal,  S1 and  S2 ,no S3.  No murmur, or thrill. Apical beat not displaced Peripheral pulses normal.  Abdomen: Soft, non tender, no organomegaly or masses.  Musculoskeletal exam: Full ROM of spine, hips , shoulders and knees. No deformity ,swelling or crepitus noted. No muscle wasting or atrophy.   Neurologic: Cranial nerves 2 to 12 intact. Power, tone ,sensation  normal throughout. No disturbance in gait. No tremor.  Skin: Intact, no ulceration, erythema , scaling or rash noted. Pigmentation normal throughout  Psych; Normal mood and affect. Judgement and concentration normal   Assessment & Plan:  Encounter for annual physical exam Annual exam as documented. Counseling done  re healthy lifestyle involving commitment to 150 minutes exercise per week, heart healthy diet, and attaining healthy weight.The importance of adequate sleep also discussed.  Changes in health habits are decided on by the patient with goals and time frames  set for achieving them. Immunization and cancer screening needs are specifically addressed at  this visit.   Depression, major, single episode, moderate (HCC) Start lexapro  10 mg daily, encouraged therapy however not committing at this time Review in 6 to 10 weeks  GAD (generalized anxiety disorder) Start lexapro  and re eval in 5 to 10 weeks

## 2023-11-25 NOTE — Patient Instructions (Signed)
 F/U in 8 weeks, call if you need me sooner  It is important that you exercise regularly at least 30 minutes 5 times a week. If you develop chest pain, have severe difficulty breathing, or feel very tired, stop exercising immediately and seek medical attention   New medication lexapro 10 mg once daily for treartment of anxiety and depression  Consider therapy as well  Thanks for choosing [facility], we consider it a privelige to serve you.

## 2023-11-28 NOTE — Assessment & Plan Note (Signed)
 Start lexapro  10 mg daily, encouraged therapy however not committing at this time Review in 6 to 10 weeks

## 2023-11-28 NOTE — Assessment & Plan Note (Signed)
 Start lexapro  and re eval in 5 to 10 weeks

## 2023-12-20 ENCOUNTER — Encounter: Payer: Self-pay | Admitting: Family Medicine

## 2023-12-21 ENCOUNTER — Other Ambulatory Visit: Payer: Self-pay

## 2023-12-21 DIAGNOSIS — Z872 Personal history of diseases of the skin and subcutaneous tissue: Secondary | ICD-10-CM

## 2024-01-20 ENCOUNTER — Ambulatory Visit: Admitting: Family Medicine

## 2024-01-24 DIAGNOSIS — H52223 Regular astigmatism, bilateral: Secondary | ICD-10-CM | POA: Diagnosis not present

## 2024-01-24 DIAGNOSIS — H5203 Hypermetropia, bilateral: Secondary | ICD-10-CM | POA: Diagnosis not present

## 2024-01-24 DIAGNOSIS — H524 Presbyopia: Secondary | ICD-10-CM | POA: Diagnosis not present

## 2024-02-01 ENCOUNTER — Encounter: Payer: Self-pay | Admitting: Family Medicine

## 2024-02-01 ENCOUNTER — Other Ambulatory Visit: Payer: Self-pay

## 2024-02-01 DIAGNOSIS — E785 Hyperlipidemia, unspecified: Secondary | ICD-10-CM

## 2024-02-01 DIAGNOSIS — F321 Major depressive disorder, single episode, moderate: Secondary | ICD-10-CM

## 2024-02-01 NOTE — Telephone Encounter (Signed)
 Ordered

## 2024-02-03 DIAGNOSIS — E785 Hyperlipidemia, unspecified: Secondary | ICD-10-CM | POA: Diagnosis not present

## 2024-02-03 DIAGNOSIS — F321 Major depressive disorder, single episode, moderate: Secondary | ICD-10-CM | POA: Diagnosis not present

## 2024-02-04 LAB — CBC WITH DIFFERENTIAL/PLATELET
Basophils Absolute: 0 x10E3/uL (ref 0.0–0.2)
Basos: 1 %
EOS (ABSOLUTE): 0.1 x10E3/uL (ref 0.0–0.4)
Eos: 3 %
Hematocrit: 41 % (ref 34.0–46.6)
Hemoglobin: 13.4 g/dL (ref 11.1–15.9)
Immature Grans (Abs): 0 x10E3/uL (ref 0.0–0.1)
Immature Granulocytes: 0 %
Lymphocytes Absolute: 1.6 x10E3/uL (ref 0.7–3.1)
Lymphs: 43 %
MCH: 31.9 pg (ref 26.6–33.0)
MCHC: 32.7 g/dL (ref 31.5–35.7)
MCV: 98 fL — ABNORMAL HIGH (ref 79–97)
Monocytes Absolute: 0.3 x10E3/uL (ref 0.1–0.9)
Monocytes: 8 %
Neutrophils Absolute: 1.7 x10E3/uL (ref 1.4–7.0)
Neutrophils: 45 %
Platelets: 281 x10E3/uL (ref 150–450)
RBC: 4.2 x10E6/uL (ref 3.77–5.28)
RDW: 12.3 % (ref 11.7–15.4)
WBC: 3.7 x10E3/uL (ref 3.4–10.8)

## 2024-02-04 LAB — CMP14+EGFR
ALT: 12 IU/L (ref 0–32)
AST: 16 IU/L (ref 0–40)
Albumin: 4.3 g/dL (ref 3.9–4.9)
Alkaline Phosphatase: 57 IU/L (ref 44–121)
BUN/Creatinine Ratio: 30 — ABNORMAL HIGH (ref 12–28)
BUN: 18 mg/dL (ref 8–27)
Bilirubin Total: 0.3 mg/dL (ref 0.0–1.2)
CO2: 23 mmol/L (ref 20–29)
Calcium: 9.4 mg/dL (ref 8.7–10.3)
Chloride: 105 mmol/L (ref 96–106)
Creatinine, Ser: 0.61 mg/dL (ref 0.57–1.00)
Globulin, Total: 2.1 g/dL (ref 1.5–4.5)
Glucose: 85 mg/dL (ref 70–99)
Potassium: 4.1 mmol/L (ref 3.5–5.2)
Sodium: 141 mmol/L (ref 134–144)
Total Protein: 6.4 g/dL (ref 6.0–8.5)
eGFR: 102 mL/min/1.73 (ref >59)

## 2024-02-04 LAB — LIPID PANEL
Chol/HDL Ratio: 3 ratio (ref 0.0–4.4)
Cholesterol, Total: 197 mg/dL (ref 100–199)
HDL: 65 mg/dL (ref >39)
LDL Chol Calc (NIH): 122 mg/dL — ABNORMAL HIGH (ref 0–99)
Triglycerides: 52 mg/dL (ref 0–149)
VLDL Cholesterol Cal: 10 mg/dL (ref 5–40)

## 2024-02-04 LAB — TSH: TSH: 1.96 u[IU]/mL (ref 0.450–4.500)

## 2024-02-08 ENCOUNTER — Other Ambulatory Visit (HOSPITAL_COMMUNITY): Payer: Self-pay

## 2024-02-08 ENCOUNTER — Ambulatory Visit: Admitting: Family Medicine

## 2024-02-08 VITALS — BP 102/67 | HR 75 | Resp 16 | Ht 62.5 in | Wt 141.1 lb

## 2024-02-08 DIAGNOSIS — F321 Major depressive disorder, single episode, moderate: Secondary | ICD-10-CM

## 2024-02-08 DIAGNOSIS — F411 Generalized anxiety disorder: Secondary | ICD-10-CM

## 2024-02-08 DIAGNOSIS — E785 Hyperlipidemia, unspecified: Secondary | ICD-10-CM | POA: Diagnosis not present

## 2024-02-08 DIAGNOSIS — E559 Vitamin D deficiency, unspecified: Secondary | ICD-10-CM | POA: Diagnosis not present

## 2024-02-08 MED ORDER — ESCITALOPRAM OXALATE 10 MG PO TABS
10.0000 mg | ORAL_TABLET | Freq: Every day | ORAL | 3 refills | Status: AC
  Filled 2024-02-08 – 2024-02-18 (×2): qty 90, 90d supply, fill #0
  Filled 2024-05-22: qty 90, 90d supply, fill #1
  Filled 2024-08-16: qty 90, 90d supply, fill #2

## 2024-02-08 NOTE — Patient Instructions (Addendum)
 F/U  in 6 months  Stay on same medication  Mammogram due 01/10 or after, pls schedule   Fasting cbc and diff, bmp and eGFR , vit D to be drawn approx 3 to 5 days before next appt   It is important that you exercise regularly at least 30 minutes 5 times a week. If you develop chest pain, have severe difficulty breathing, or feel very tired, stop exercising immediately and seek medical attention   Thanks for choosing [facility], we consider it a privelige to serve you.

## 2024-02-08 NOTE — Assessment & Plan Note (Signed)
 Updated lab needed at/ before next visit.

## 2024-02-08 NOTE — Assessment & Plan Note (Signed)
 Marked improvement continue current med

## 2024-02-08 NOTE — Progress Notes (Signed)
   Holly Murphy     MRN: [id number]      DOB: 1960-10-17  Chief Complaint  Patient presents with   Medical Management of Chronic Issues    8 week follow up     HPI Holly Murphy is here for follow up and re-evaluation of chronic medical conditions, medication management and review of any available recent lab and radiology data.  Preventive health is updated, specifically  Cancer screening and Immunization.   INITIALLY HAD SLIGHT FATIGUE ON MEDICATION HOWEVER THIS HAS RESOLVED WORKING ON MORE REGULAR EXERCISE HAS JOINED WEIGHT WATCHERS AND HAS LOST A FEW POUNDS ROS Denies recent fever or chills. Denies sinus pressure, nasal congestion, ear pain or sore throat. Denies chest congestion, productive cough or wheezing. Denies chest pains, palpitations and leg swelling Denies abdominal pain, nausea, vomiting,diarrhea or constipation.   Denies dysuria, frequency, hesitancy or incontinence. Denies joint pain, swelling and limitation in mobility. Denies headaches, seizures, numbness, or tingling. Denies  uncontrolled depression, anxiety or insomnia. Denies skin break down or rash.   PE  BP 102/67   Pulse 75   Resp 16   Ht 5' 2.5 (1.588 m)   Wt 141 lb 1.9 oz (64 kg)   SpO2 97%   BMI 25.40 kg/m   Patient alert and oriented and in no cardiopulmonary distress.  HEENT: No facial asymmetry, EOMI,     Neck supple .  Chest: Clear to auscultation bilaterally.  CVS: S1, S2 no murmurs, no S3.Regular rate.  ABD: Soft non tender.   Ext: No edema  MS: Adequate ROM spine, shoulders, hips and knees.  Skin: Intact, no ulcerations or rash noted.  Psych: Good eye contact, normal affect. Memory intact not anxious or depressed appearing.  CNS: CN 2-12 intact, power,  normal throughout.no focal deficits noted.   Assessment & Plan  Depression, major, single episode, moderate (HCC) Marked improvement continue current med  GAD (generalized anxiety disorder) Continue current  med  Vitamin D  deficiency Updated lab needed at/ before next visit.   Dyslipidemia, goal LDL below 100 Hyperlipidemia:Low fat diet discussed and encouraged.   Lipid Panel  Lab Results  Component Value Date   CHOL 197 02/03/2024   HDL 65 02/03/2024   LDLCALC 122 (H) 02/03/2024   TRIG 52 02/03/2024   CHOLHDL 3.0 02/03/2024     Updated lab needed at/ before next visit.

## 2024-02-08 NOTE — Assessment & Plan Note (Signed)
Continue current med 

## 2024-02-08 NOTE — Assessment & Plan Note (Signed)
 Hyperlipidemia:Low fat diet discussed and encouraged.   Lipid Panel  Lab Results  Component Value Date   CHOL 197 02/03/2024   HDL 65 02/03/2024   LDLCALC 122 (H) 02/03/2024   TRIG 52 02/03/2024   CHOLHDL 3.0 02/03/2024     Updated lab needed at/ before next visit.

## 2024-02-09 ENCOUNTER — Other Ambulatory Visit (HOSPITAL_COMMUNITY): Payer: Self-pay

## 2024-02-18 ENCOUNTER — Other Ambulatory Visit (HOSPITAL_COMMUNITY): Payer: Self-pay

## 2024-02-18 ENCOUNTER — Other Ambulatory Visit: Payer: Self-pay

## 2024-04-26 ENCOUNTER — Encounter (INDEPENDENT_AMBULATORY_CARE_PROVIDER_SITE_OTHER): Payer: Self-pay | Admitting: Gastroenterology

## 2024-05-17 ENCOUNTER — Encounter: Payer: Self-pay | Admitting: Podiatry

## 2024-05-17 ENCOUNTER — Ambulatory Visit: Admitting: Podiatry

## 2024-05-17 DIAGNOSIS — M2012 Hallux valgus (acquired), left foot: Secondary | ICD-10-CM | POA: Diagnosis not present

## 2024-05-17 DIAGNOSIS — L600 Ingrowing nail: Secondary | ICD-10-CM

## 2024-05-17 NOTE — Progress Notes (Signed)
 Patient complains of painful ingrown medial border border(s) hallux left.  Gets pressure on the hallux from the second toe.  Where she has a bunion deformity.  Patient denies fevers, chills, nausea, vomiting.  Objective:  Vitals: Reviewed  General: Well developed, nourished, in no acute distress, alert and oriented x3   Vascular: DP pulse 2/4 bilateral. PT pulse 2/4 bilateral.  Minimal edema lower extremity bilaterally  Dermatology: Erythema, edema, incurvated nail border medial border hallux left with slight clear drainage . Tenderness present with palpation. Normal skin tone and texture feet with normal hair growth.  Neurological: Grossly intact. Normal reflexes.   Musculoskeletal: Tenderness with palpation of the distal hallux left. No tenderness or painful ROM at IPJ.  Second toe overlaps somewhat on the great toe with a hallux valgus deformity left.  Hammertoe deformity second toe left  Diagnosis: Hallux valgus left 2.  Ingrown nail medial border hallux left  Plan: -New patient office visit Level 3 for evaluation and management.  Modifier 25 -discussed etiology and treatment of ingrown nails. Discussed surgical vs conservative treatment.  Discussed the overlapping second toe and hallux valgus deformity which is probably contributing to some of the problem with the ingrown nail on the left. -Consent signed for appropriate matrixectomy affected nail(s).   Procedure(s):   - Matrixectomy(s) medial border hallux left: Toe anesthetized with 3cc 2:1 mixture 2% Lidocaine  with epinephrine: Sodium Bicarbonate. Surgical site prepped. Digital tourniquet applied.  Avulsion of nail border. performed. Matrixecomy performed with three 30 second applications of phenol to nail matrix. Site irrigated with alcohol.  Tourniquet released with good vascularity noticed in digit.  Applied triple antibiotic to nailbed and applied gauze and Coban dressing. - Written and oral postoperative instructions  given.  -Return for post-op 2 weeks.  JINNY Prentice Binder, DPM

## 2024-05-17 NOTE — Patient Instructions (Signed)

## 2024-05-22 ENCOUNTER — Other Ambulatory Visit (HOSPITAL_COMMUNITY): Payer: Self-pay

## 2024-05-24 ENCOUNTER — Ambulatory Visit: Admitting: Podiatry

## 2024-05-31 ENCOUNTER — Ambulatory Visit: Admitting: Podiatry

## 2024-06-01 ENCOUNTER — Ambulatory Visit (INDEPENDENT_AMBULATORY_CARE_PROVIDER_SITE_OTHER): Admitting: Podiatry

## 2024-06-01 ENCOUNTER — Encounter: Payer: Self-pay | Admitting: Podiatry

## 2024-06-01 DIAGNOSIS — L600 Ingrowing nail: Secondary | ICD-10-CM

## 2024-06-01 NOTE — Progress Notes (Signed)
 Patient presents follow-up nail surgery toe.  No complaints.  Physical exam:  Dermatologic: Nail surgery site for matrixectomy healing well with no signs of infection.  Diagnosis: 1.  Status post matrixectomy toe.  Healing well  Plan: - POV status post nail surgery , if patient has any problems she can call for appointment otherwise we can see her as needed  - Return as needed

## 2024-06-02 ENCOUNTER — Other Ambulatory Visit (HOSPITAL_COMMUNITY): Payer: Self-pay

## 2024-07-24 ENCOUNTER — Encounter: Payer: Self-pay | Admitting: *Deleted

## 2024-08-02 ENCOUNTER — Ambulatory Visit: Payer: Self-pay | Admitting: Family Medicine

## 2024-08-02 ENCOUNTER — Other Ambulatory Visit (HOSPITAL_COMMUNITY): Payer: Self-pay | Admitting: Family Medicine

## 2024-08-02 DIAGNOSIS — Z1231 Encounter for screening mammogram for malignant neoplasm of breast: Secondary | ICD-10-CM

## 2024-08-02 LAB — CBC WITH DIFFERENTIAL/PLATELET
Basophils Absolute: 0 x10E3/uL (ref 0.0–0.2)
Basos: 1 %
EOS (ABSOLUTE): 0.1 x10E3/uL (ref 0.0–0.4)
Eos: 3 %
Hematocrit: 39.5 % (ref 34.0–46.6)
Hemoglobin: 13.2 g/dL (ref 11.1–15.9)
Immature Grans (Abs): 0 x10E3/uL (ref 0.0–0.1)
Immature Granulocytes: 0 %
Lymphocytes Absolute: 1.7 x10E3/uL (ref 0.7–3.1)
Lymphs: 47 %
MCH: 32.2 pg (ref 26.6–33.0)
MCHC: 33.4 g/dL (ref 31.5–35.7)
MCV: 96 fL (ref 79–97)
Monocytes Absolute: 0.4 x10E3/uL (ref 0.1–0.9)
Monocytes: 10 %
Neutrophils Absolute: 1.5 x10E3/uL (ref 1.4–7.0)
Neutrophils: 39 %
Platelets: 267 x10E3/uL (ref 150–450)
RBC: 4.1 x10E6/uL (ref 3.77–5.28)
RDW: 12.8 % (ref 11.7–15.4)
WBC: 3.7 x10E3/uL (ref 3.4–10.8)

## 2024-08-02 LAB — BMP8+EGFR
BUN/Creatinine Ratio: 18 (ref 12–28)
BUN: 12 mg/dL (ref 8–27)
CO2: 23 mmol/L (ref 20–29)
Calcium: 9.3 mg/dL (ref 8.7–10.3)
Chloride: 106 mmol/L (ref 96–106)
Creatinine, Ser: 0.68 mg/dL (ref 0.57–1.00)
Glucose: 93 mg/dL (ref 70–99)
Potassium: 4.5 mmol/L (ref 3.5–5.2)
Sodium: 143 mmol/L (ref 134–144)
eGFR: 99 mL/min/1.73

## 2024-08-02 LAB — VITAMIN D 25 HYDROXY (VIT D DEFICIENCY, FRACTURES): Vit D, 25-Hydroxy: 32.4 ng/mL (ref 30.0–100.0)

## 2024-08-03 ENCOUNTER — Encounter (HOSPITAL_COMMUNITY): Payer: Self-pay

## 2024-08-03 ENCOUNTER — Ambulatory Visit (HOSPITAL_COMMUNITY)
Admission: RE | Admit: 2024-08-03 | Discharge: 2024-08-03 | Disposition: A | Discharge status: Home / Self Care | Source: Ambulatory Visit | Attending: Family Medicine | Admitting: Family Medicine

## 2024-08-03 DIAGNOSIS — Z1231 Encounter for screening mammogram for malignant neoplasm of breast: Secondary | ICD-10-CM | POA: Diagnosis present

## 2024-08-08 ENCOUNTER — Ambulatory Visit: Admitting: Family Medicine

## 2024-08-17 ENCOUNTER — Other Ambulatory Visit (HOSPITAL_BASED_OUTPATIENT_CLINIC_OR_DEPARTMENT_OTHER): Payer: Self-pay

## 2024-08-31 ENCOUNTER — Ambulatory Visit: Admitting: Dermatology

## 2024-10-03 ENCOUNTER — Ambulatory Visit: Admitting: Family Medicine
